# Patient Record
Sex: Male | Born: 1950
Health system: Southern US, Community
[De-identification: ages and names within clinical notes are randomized; demographics above are authoritative.]

## PROBLEM LIST (undated history)

## (undated) DIAGNOSIS — E785 Hyperlipidemia, unspecified: Secondary | ICD-10-CM

## (undated) DIAGNOSIS — E7800 Pure hypercholesterolemia, unspecified: Secondary | ICD-10-CM

## (undated) DIAGNOSIS — I429 Cardiomyopathy, unspecified: Secondary | ICD-10-CM

## (undated) DIAGNOSIS — M5430 Sciatica, unspecified side: Secondary | ICD-10-CM

## (undated) DIAGNOSIS — I2510 Atherosclerotic heart disease of native coronary artery without angina pectoris: Secondary | ICD-10-CM

## (undated) DIAGNOSIS — E119 Type 2 diabetes mellitus without complications: Secondary | ICD-10-CM

## (undated) DIAGNOSIS — I509 Heart failure, unspecified: Secondary | ICD-10-CM

## (undated) DIAGNOSIS — I10 Essential (primary) hypertension: Secondary | ICD-10-CM

## (undated) DIAGNOSIS — L409 Psoriasis, unspecified: Secondary | ICD-10-CM

## (undated) DIAGNOSIS — R079 Chest pain, unspecified: Secondary | ICD-10-CM

## (undated) HISTORY — DX: Pure hypercholesterolemia, unspecified: E78.00

## (undated) HISTORY — DX: Hyperlipidemia, unspecified: E78.5

## (undated) HISTORY — DX: Type 2 diabetes mellitus without complications: E11.9

## (undated) HISTORY — DX: Chest pain, unspecified: R07.9

## (undated) HISTORY — DX: Sciatica, unspecified side: M54.30

## (undated) HISTORY — DX: Heart failure, unspecified: I50.9

## (undated) HISTORY — DX: Atherosclerotic heart disease of native coronary artery without angina pectoris: I25.10

## (undated) HISTORY — DX: Essential (primary) hypertension: I10

## (undated) HISTORY — DX: Psoriasis, unspecified: L40.9

## (undated) HISTORY — PX: CORONARY ANGIOPLASTY WITH STENT PLACEMENT: SHX49

## (undated) HISTORY — DX: Cardiomyopathy, unspecified: I42.9

---

## 2005-05-31 ENCOUNTER — Encounter: Admission: RE | Admit: 2005-05-31 | Discharge: 2005-06-07 | Payer: Self-pay | Admitting: Family Medicine

## 2008-08-29 ENCOUNTER — Encounter: Admission: RE | Admit: 2008-08-29 | Discharge: 2008-10-06 | Payer: Self-pay | Admitting: Family Medicine

## 2009-10-26 ENCOUNTER — Observation Stay (HOSPITAL_COMMUNITY): Admission: RE | Admit: 2009-10-26 | Discharge: 2009-10-27 | Payer: Self-pay | Admitting: Interventional Cardiology

## 2010-11-12 LAB — BASIC METABOLIC PANEL
Calcium: 8.9 mg/dL (ref 8.4–10.5)
Creatinine, Ser: 1.59 mg/dL — ABNORMAL HIGH (ref 0.4–1.5)
GFR calc Af Amer: 54 mL/min — ABNORMAL LOW (ref >60)

## 2010-11-12 LAB — GLUCOSE, CAPILLARY
Glucose-Capillary: 157 mg/dL — ABNORMAL HIGH (ref 70–99)
Glucose-Capillary: 185 mg/dL — ABNORMAL HIGH (ref 70–99)

## 2010-11-12 LAB — CBC
RBC: 4.48 MIL/uL (ref 4.22–5.81)
WBC: 6.8 10*3/uL (ref 4.0–10.5)

## 2013-05-29 ENCOUNTER — Encounter: Payer: Self-pay | Admitting: Interventional Cardiology

## 2013-05-29 ENCOUNTER — Encounter: Payer: Self-pay | Admitting: *Deleted

## 2013-05-29 DIAGNOSIS — I10 Essential (primary) hypertension: Secondary | ICD-10-CM | POA: Insufficient documentation

## 2013-05-29 DIAGNOSIS — L409 Psoriasis, unspecified: Secondary | ICD-10-CM | POA: Insufficient documentation

## 2013-05-29 DIAGNOSIS — N183 Chronic kidney disease, stage 3 unspecified: Secondary | ICD-10-CM | POA: Insufficient documentation

## 2013-05-29 DIAGNOSIS — I25119 Atherosclerotic heart disease of native coronary artery with unspecified angina pectoris: Secondary | ICD-10-CM | POA: Insufficient documentation

## 2013-05-29 DIAGNOSIS — I5042 Chronic combined systolic (congestive) and diastolic (congestive) heart failure: Secondary | ICD-10-CM | POA: Insufficient documentation

## 2013-05-29 DIAGNOSIS — M5430 Sciatica, unspecified side: Secondary | ICD-10-CM | POA: Insufficient documentation

## 2013-05-29 DIAGNOSIS — I5043 Acute on chronic combined systolic (congestive) and diastolic (congestive) heart failure: Secondary | ICD-10-CM | POA: Insufficient documentation

## 2013-05-29 DIAGNOSIS — E785 Hyperlipidemia, unspecified: Secondary | ICD-10-CM | POA: Insufficient documentation

## 2013-05-29 DIAGNOSIS — E119 Type 2 diabetes mellitus without complications: Secondary | ICD-10-CM | POA: Insufficient documentation

## 2013-05-29 DIAGNOSIS — I2510 Atherosclerotic heart disease of native coronary artery without angina pectoris: Secondary | ICD-10-CM | POA: Insufficient documentation

## 2013-05-29 DIAGNOSIS — E7800 Pure hypercholesterolemia, unspecified: Secondary | ICD-10-CM | POA: Insufficient documentation

## 2013-05-29 DIAGNOSIS — I429 Cardiomyopathy, unspecified: Secondary | ICD-10-CM | POA: Insufficient documentation

## 2013-05-29 DIAGNOSIS — R079 Chest pain, unspecified: Secondary | ICD-10-CM | POA: Insufficient documentation

## 2013-06-02 ENCOUNTER — Encounter (INDEPENDENT_AMBULATORY_CARE_PROVIDER_SITE_OTHER): Payer: Self-pay

## 2013-06-02 ENCOUNTER — Encounter: Payer: Self-pay | Admitting: Interventional Cardiology

## 2013-06-02 ENCOUNTER — Ambulatory Visit (INDEPENDENT_AMBULATORY_CARE_PROVIDER_SITE_OTHER): Payer: 59 | Admitting: Interventional Cardiology

## 2013-06-02 VITALS — BP 124/76 | HR 88 | Ht 67.0 in | Wt 185.0 lb

## 2013-06-02 DIAGNOSIS — I428 Other cardiomyopathies: Secondary | ICD-10-CM

## 2013-06-02 DIAGNOSIS — R079 Chest pain, unspecified: Secondary | ICD-10-CM

## 2013-06-02 DIAGNOSIS — I10 Essential (primary) hypertension: Secondary | ICD-10-CM

## 2013-06-02 DIAGNOSIS — E785 Hyperlipidemia, unspecified: Secondary | ICD-10-CM

## 2013-06-02 DIAGNOSIS — I429 Cardiomyopathy, unspecified: Secondary | ICD-10-CM

## 2013-06-02 DIAGNOSIS — I5022 Chronic systolic (congestive) heart failure: Secondary | ICD-10-CM

## 2013-06-02 DIAGNOSIS — I2510 Atherosclerotic heart disease of native coronary artery without angina pectoris: Secondary | ICD-10-CM

## 2013-06-02 NOTE — Progress Notes (Deleted)
Patient ID: Marc [name], male   DOB: [birth year], 62 y.o.   MRN: [id number]    HPI  Allergies not on file  Current Outpatient Prescriptions  Medication Sig Dispense Refill  . aspirin 325 MG tablet Take 325 mg by mouth daily.      . carvedilol (COREG) 25 MG tablet Take 25 mg by mouth 2 (two) times daily with a meal.      . clopidogrel (PLAVIX) 75 MG tablet Take 75 mg by mouth daily.      Marland Kitchen ezetimibe-simvastatin (VYTORIN) 10-20 MG per tablet Take 1 tablet by mouth at bedtime.      . hydrochlorothiazide (MICROZIDE) 12.5 MG capsule Take 12.5 mg by mouth daily.      . meloxicam (MOBIC) 15 MG tablet Take 15 mg by mouth daily.      . metFORMIN (GLUCOPHAGE) 500 MG tablet Take 500 mg by mouth 2 (two) times daily with a meal.      . Multiple Vitamin (MULTIVITAMIN) capsule Take 1 capsule by mouth daily.      Marland Kitchen selenium sulfide (SELSUN) 2.5 % shampoo Apply topically daily as needed for itching.      . terbinafine (LAMISIL) 250 MG tablet Take 250 mg by mouth daily.       No current facility-administered medications for this visit.    Past Medical History  Diagnosis Date  . Heart failure   . Cardiomyopathy     Left ventricle dysfunction with LVEF 35-40% by echo March 2009  . Hypercholesteremia   . Hyperlipidemia   . Coronary atherosclerosis of native coronary artery     with large anterior myocardial infarction 1997. PTCA LAD. Distal circumflex and first diagonal DES 2011  . HTN (hypertension)   . Diabetes   . Sciatica   . Hypercholesteremia   . Chest pain   . Hyperlipidemia   . Psoriasis     Past Surgical History  Procedure Laterality Date  . Coronary angioplasty with stent placement      x 2 Dr. Tamala Julian    ROS: PHYSICAL EXAM There were no vitals taken for this visit.  EKG:  ASSESSMENT AND PLAN

## 2013-06-02 NOTE — Patient Instructions (Signed)
Your physician recommends that you continue on your current medications as directed. Please refer to the Current Medication list given to you today.  Your physician recommends that you schedule a follow-up appointment in: 6 months with Dr.Smith  Please stay active and follow a low carb diet. You are doing great.

## 2013-06-02 NOTE — Progress Notes (Signed)
Patient ID: Marc [name], male   DOB: 06/30/1951, 62 y.o.   MRN: [id number]    HPI Marc [name] is under stress at work and at home. He denies angina. He has not been exercising recently because of work constraints. There've been no episodes of nitroglycerin use. He denies orthopnea. He did have been asked to follow coming down stairs, but did not break any bones. He has gained some weight. He denies medication side effects.  No Known Allergies  Current Outpatient Prescriptions  Medication Sig Dispense Refill  . aspirin 325 MG tablet Take 325 mg by mouth daily.      Marland Kitchen atorvastatin (LIPITOR) 80 MG tablet Take 80 mg by mouth daily.      . calcium carbonate (TUMS - DOSED IN MG ELEMENTAL CALCIUM) 500 MG chewable tablet Chew 1 tablet by mouth as needed for heartburn.      . carvedilol (COREG) 25 MG tablet Take 25 mg by mouth 2 (two) times daily with a meal.      . Cinnamon 500 MG capsule Take 500 mg by mouth daily.      . clopidogrel (PLAVIX) 75 MG tablet Take 75 mg by mouth daily.      Marland Kitchen ezetimibe (ZETIA) 10 MG tablet Take 10 mg by mouth daily.      . fish oil-omega-3 fatty acids 1000 MG capsule Take 3 g by mouth daily.      . fluocinonide gel (LIDEX) 0.05 % Apply topically 3 (three) times a week.      . hydrochlorothiazide (MICROZIDE) 12.5 MG capsule Take 12.5 mg by mouth daily.      . irbesartan (AVAPRO) 300 MG tablet Take 300 mg by mouth at bedtime.      . Lactase (LACTAID PO) Take by mouth as needed.      . metFORMIN (GLUCOPHAGE) 500 MG tablet Take 500 mg by mouth 2 (two) times daily with a meal.      . Multiple Vitamin (MULTIVITAMIN) capsule Take 1 capsule by mouth daily.      . nitroGLYCERIN (NITROSTAT) 0.4 MG SL tablet Place 0.4 mg under the tongue every 5 (five) minutes as needed for chest pain.      Marland Kitchen selenium sulfide (SELSUN) 2.5 % shampoo Apply topically daily as needed for itching.       No current facility-administered medications for this visit.    Past Medical History    Diagnosis Date  . Heart failure   . Cardiomyopathy     Left ventricle dysfunction with LVEF 35-40% by echo March 2009  . Hypercholesteremia   . Hyperlipidemia   . Coronary atherosclerosis of native coronary artery     with large anterior myocardial infarction 1997. PTCA LAD. Distal circumflex and first diagonal DES 2011  . HTN (hypertension)   . Diabetes   . Sciatica   . Hypercholesteremia   . Chest pain   . Hyperlipidemia   . Psoriasis     Past Surgical History  Procedure Laterality Date  . Coronary angioplasty with stent placement      x 2 Dr. Tamala Julian    Family History  Problem Relation Age of Onset  . Arrhythmia Mother   . Prostate cancer Father   . Diabetes Mother   . Heart failure Mother   . Hypertension Mother   . Stroke Father   . Thyroid disease Brother     History   Social History  . Marital Status: Married    Spouse Name: N/A  Number of Children: N/A  . Years of Education: N/A   Occupational History  . Not on file.   Social History Main Topics  . Smoking status: Never Smoker   . Smokeless tobacco: Not on file  . Alcohol Use: Yes     Comment: occasional  . Drug Use: No  . Sexual Activity: Not on file   Other Topics Concern  . Not on file   Social History Narrative  . No narrative on file    ROS: Bilateral knee arthritis prevents certain activities. He feels that this was the root of his fall on the staircase. No neurological complaints. He denies palpitations. No medication side effects.  PHYSICAL EXAM BP 124/76  Pulse 88  Ht 5\' 7"  (1.702 m)  Wt 185 lb (83.915 kg)  BMI 28.97 kg/m2 Skin is clear HEENT exam unremarkable No JVD is noted. No carotid bruits. S4 gallop. No murmur. No peripheral edema. Femoral and posterior tibial pulses 2+. No neurological abnormalities are noted.  EKG: Anteroseptal infarction, old. Prominent voltage.  ASSESSMENT AND PLAN  1. Coronary atherosclerosis without angina.  2. Chronic systolic heart  failure, stable without limiting symptoms.  3. Hyperlipidemia, on therapy with good lipid panel within the last 2 months.  4. Hypertension , controlled.  In summary, Marc [name] is stable. He denies symptoms of angina, and heart failure. No medication side effects. He has gained low, weight and lost some conditioning. I encouraged physical activity. I encourage caloric intake reduction.

## 2013-06-15 ENCOUNTER — Other Ambulatory Visit: Payer: Self-pay | Admitting: Interventional Cardiology

## 2013-08-26 ENCOUNTER — Other Ambulatory Visit: Payer: Self-pay | Admitting: *Deleted

## 2013-08-26 MED ORDER — ATORVASTATIN CALCIUM 80 MG PO TABS: 80.0000 mg | ORAL_TABLET | Freq: Every day | ORAL | Status: DC

## 2013-09-24 ENCOUNTER — Telehealth: Payer: Self-pay

## 2013-09-24 MED ORDER — EZETIMIBE 10 MG PO TABS: 10.0000 mg | ORAL_TABLET | Freq: Every day | ORAL | Status: DC

## 2013-09-24 NOTE — Telephone Encounter (Signed)
Refill

## 2014-01-28 ENCOUNTER — Other Ambulatory Visit: Payer: Self-pay | Admitting: *Deleted

## 2014-01-28 MED ORDER — ATORVASTATIN CALCIUM 80 MG PO TABS: 80.0000 mg | ORAL_TABLET | Freq: Every day | ORAL | Status: DC

## 2014-02-15 ENCOUNTER — Other Ambulatory Visit: Payer: Self-pay | Admitting: *Deleted

## 2014-02-15 MED ORDER — NITROGLYCERIN 0.4 MG SL SUBL: 0.4000 mg | SUBLINGUAL_TABLET | SUBLINGUAL | Status: DC | PRN

## 2014-02-25 ENCOUNTER — Encounter: Payer: Self-pay | Admitting: *Deleted

## 2014-02-25 ENCOUNTER — Encounter: Payer: 59 | Attending: Family Medicine | Admitting: *Deleted

## 2014-02-25 VITALS — Ht 67.0 in | Wt 185.2 lb

## 2014-02-25 DIAGNOSIS — Z713 Dietary counseling and surveillance: Secondary | ICD-10-CM | POA: Diagnosis not present

## 2014-02-25 DIAGNOSIS — E119 Type 2 diabetes mellitus without complications: Secondary | ICD-10-CM | POA: Insufficient documentation

## 2014-02-25 NOTE — Progress Notes (Signed)
Appt start time: 0800 end time:  0930.  Assessment:  Patient was seen on  02/25/14 for individual diabetes education. Here with his wife Luellen Pucker, who appears supportive. They have modified portion sizes about 2 month ago in effort to lose weight. SMBG 3 days a week with reported current range of 110-148 mg/dl pre and post meals. His wife buys and prepares their meals. 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 He enjoys raquetball, music, watching basketball.   Current HbA1c: 11.5%  Preferred Learning Style: all of the following  Auditory  Visual  Hands on  Learning Readiness:   Ready  Change in progress  MEDICATIONS: see list, diabetes medications are Metformin and Amaryl  DIETARY INTAKE: 24-hr recall:  B ( AM): Breakfast bar with banana and 2-3 oz juice  Snk ( AM): no  L ( PM): buys at work, [place] sandwich OR grilled chicken or salmon salad with non creamy dressing, unsweet tea with lemon Snk ( PM): breakfast bar D ( PM): lean meat, occasionally a starch, vegetables, occasionally salad with vinaigrette dressing Snk ( PM): almonds Beverages: juice, unsweet tea  Usual physical activity: racquetball, walking, mowing grass with push mower  Estimated energy needs: 1600 calories 180 g carbohydrates 120 g protein 44 g fat  Progress Towards Goal(s):  In progress.   Nutritional Diagnosis:  NB-1.1 Food and nutrition-related knowledge deficit As related to diabetes.  As evidenced by A1c of 11.5%.    Intervention:  Nutrition counseling provided.  Discussed diabetes disease process and treatment options.  Discussed physiology of diabetes and role of obesity on insulin resistance.  Encouraged moderate weight reduction to improve glucose levels.    Provided education on macronutrients on glucose levels.  Provided education on carb counting, importance of regularly scheduled meals/snacks, and meal planning  Discussed effects of physical activity on glucose  levels and long-term glucose control.  Commended him on his current physical activity/week.  Reviewed patient medications.  Discussed role of medication on blood glucose and possible side effects  Discussed blood glucose monitoring and interpretation.  Discussed recommended target ranges and individual ranges.    Described short-term complications: hyper- and hypo-glycemia.  Discussed causes,symptoms, and treatment options.  Discussed prevention, detection, and treatment of long-term complications.  Discussed the role of prolonged elevated glucose levels on body systems.  Discussed role of stress on blood glucose levels and discussed strategies to manage psychosocial issues.  Discussed recommendations for long-term diabetes self-care.  Provided checklist for medical, dental, and emotional self-care.  Plan:  Aim for 3 Carb Choices per meal (45 grams) +/- 1 either way  Aim for 0-2 Carbs per snack if hungry  Include protein in moderation with your meals and snacks Consider reading food labels for Total Carbohydrate of foods Continue withyour activity level as tolerated Continue checking BG at alternate times per day as directed by MD  Continue taking medication as directed by MD  Teaching Method Utilized: Visual, Auditory and Hands on  Handouts given during visit include: Living Well with Diabetes Carb Counting and Food Label handouts Meal Plan Card  Barriers to learning/adherence to lifestyle change: none  Diabetes self-care support plan:   [facility] support group  Demonstrated degree of understanding via:  Teach Back   Monitoring/Evaluation:  Dietary intake, exercise, SMBG, reading food labels, and body weight prn.

## 2014-02-25 NOTE — Patient Instructions (Signed)
Plan:  Aim for 3 Carb Choices per meal (45 grams) +/- 1 either way  Aim for 0-2 Carbs per snack if hungry  Include protein in moderation with your meals and snacks Consider reading food labels for Total Carbohydrate of foods Continue withyour activity level as tolerated Continue checking BG at alternate times per day as directed by MD  Continue taking medication as directed by MD

## 2014-03-01 ENCOUNTER — Other Ambulatory Visit: Payer: Self-pay | Admitting: *Deleted

## 2014-03-01 MED ORDER — ATORVASTATIN CALCIUM 80 MG PO TABS
80.0000 mg | ORAL_TABLET | Freq: Every day | ORAL | Status: DC
Start: 2014-03-01 — End: 2014-04-27

## 2014-03-17 ENCOUNTER — Ambulatory Visit: Payer: 59 | Admitting: Dietician

## 2014-03-25 ENCOUNTER — Ambulatory Visit (INDEPENDENT_AMBULATORY_CARE_PROVIDER_SITE_OTHER): Payer: 59 | Admitting: *Deleted

## 2014-03-25 DIAGNOSIS — I429 Cardiomyopathy, unspecified: Secondary | ICD-10-CM

## 2014-03-25 DIAGNOSIS — E7800 Pure hypercholesterolemia, unspecified: Secondary | ICD-10-CM

## 2014-03-25 DIAGNOSIS — I428 Other cardiomyopathies: Secondary | ICD-10-CM

## 2014-03-25 LAB — CHOLESTEROL, TOTAL: Cholesterol: 67 mg/dL (ref 0–200)

## 2014-03-25 LAB — ALT: ALT: 22 U/L (ref 0–53)

## 2014-04-08 ENCOUNTER — Telehealth: Payer: Self-pay

## 2014-04-08 DIAGNOSIS — E785 Hyperlipidemia, unspecified: Secondary | ICD-10-CM

## 2014-04-08 NOTE — Telephone Encounter (Signed)
pt fasting lab appt sch for 07/04/14 pt aware and verbalized understanding.

## 2014-04-08 NOTE — Telephone Encounter (Signed)
Message copied by Lamar Laundry on Fri Apr 08, 2014  1:27 PM ------      Message from: Daneen Schick      Created: Mon Mar 28, 2014  8:23 AM       Did he have a full lipid panel performed? Total cholesterol is 67. This is hard for me to believe. No change in therapy but will need early recheck in approximately 3 months. He needs a full lipid panel ------

## 2014-04-08 NOTE — Telephone Encounter (Signed)
lmom.pt did not have full lipid panel performed? Total cholesterol is 67.  No change in therapy but will need early recheck in approximately 3 months. He needs a full lipid panel

## 2014-04-27 ENCOUNTER — Other Ambulatory Visit: Payer: Self-pay

## 2014-04-27 MED ORDER — ATORVASTATIN CALCIUM 80 MG PO TABS: 80.0000 mg | ORAL_TABLET | Freq: Every day | ORAL | Status: DC

## 2014-06-10 ENCOUNTER — Other Ambulatory Visit: Payer: Self-pay | Admitting: *Deleted

## 2014-06-10 MED ORDER — CLOPIDOGREL BISULFATE 75 MG PO TABS: ORAL_TABLET | ORAL | Status: DC

## 2014-06-22 ENCOUNTER — Other Ambulatory Visit: Payer: Self-pay

## 2014-06-22 MED ORDER — ATORVASTATIN CALCIUM 80 MG PO TABS: 80.0000 mg | ORAL_TABLET | Freq: Every day | ORAL | Status: DC

## 2014-06-27 ENCOUNTER — Other Ambulatory Visit: Payer: Self-pay | Admitting: *Deleted

## 2014-06-27 MED ORDER — EZETIMIBE 10 MG PO TABS: 10.0000 mg | ORAL_TABLET | Freq: Every day | ORAL | Status: DC

## 2014-07-04 ENCOUNTER — Other Ambulatory Visit (INDEPENDENT_AMBULATORY_CARE_PROVIDER_SITE_OTHER): Payer: 59 | Admitting: *Deleted

## 2014-07-04 DIAGNOSIS — E785 Hyperlipidemia, unspecified: Secondary | ICD-10-CM

## 2014-07-04 LAB — LIPID PANEL
CHOL/HDL RATIO: 4
Cholesterol: 79 mg/dL (ref 0–200)
HDL: 19.2 mg/dL — ABNORMAL LOW (ref >39.00)
LDL Cholesterol: 43 mg/dL (ref 0–99)
NONHDL: 59.8
Triglycerides: 84 mg/dL (ref 0.0–149.0)
VLDL: 16.8 mg/dL (ref 0.0–40.0)

## 2014-07-07 ENCOUNTER — Telehealth: Payer: Self-pay

## 2014-07-07 MED ORDER — ATORVASTATIN CALCIUM 40 MG PO TABS: 40.0000 mg | ORAL_TABLET | Freq: Every day | ORAL | Status: DC

## 2014-07-07 NOTE — Telephone Encounter (Signed)
F/u  Patient returning call. Please contact at [phone].

## 2014-07-07 NOTE — Telephone Encounter (Signed)
Pt aware of lab results and  Dr.Smith's recommendations.Decrease atorvastatin to 40 mg daily.rx sent to pt pharmacy Pt verbalized understanding.

## 2014-07-07 NOTE — Telephone Encounter (Signed)
-----   Message from [name], MD sent at 07/06/2014  4:50 PM EST ----- Decrease atorvastatin to 40 mg daily

## 2014-07-07 NOTE — Telephone Encounter (Signed)
Called to give pt labs and Dr.Smith instructions.lmtcb

## 2014-07-13 ENCOUNTER — Other Ambulatory Visit: Payer: Self-pay | Admitting: *Deleted

## 2014-07-13 MED ORDER — CLOPIDOGREL BISULFATE 75 MG PO TABS: ORAL_TABLET | ORAL | Status: DC

## 2014-08-11 ENCOUNTER — Other Ambulatory Visit: Payer: Self-pay | Admitting: Interventional Cardiology

## 2014-08-15 ENCOUNTER — Other Ambulatory Visit: Payer: Self-pay | Admitting: *Deleted

## 2014-08-15 MED ORDER — EZETIMIBE 10 MG PO TABS: 10.0000 mg | ORAL_TABLET | Freq: Every day | ORAL | Status: DC

## 2014-09-13 ENCOUNTER — Other Ambulatory Visit: Payer: Self-pay | Admitting: Interventional Cardiology

## 2014-10-13 ENCOUNTER — Other Ambulatory Visit: Payer: Self-pay | Admitting: Interventional Cardiology

## 2014-10-14 NOTE — Telephone Encounter (Signed)
Patient has still failed to schedule an appointment. Please advise on refill. Thanks, MI

## 2014-11-03 ENCOUNTER — Other Ambulatory Visit: Payer: Self-pay | Admitting: Interventional Cardiology

## 2014-11-03 NOTE — Telephone Encounter (Signed)
Patient has had multiple warnings to schedule an appointment, but still has failed to do so. Please advise on refill. Thanks, MI

## 2014-11-08 ENCOUNTER — Telehealth: Payer: Self-pay | Admitting: Interventional Cardiology

## 2014-11-08 MED ORDER — CLOPIDOGREL BISULFATE 75 MG PO TABS: ORAL_TABLET | ORAL | Status: DC

## 2014-11-08 MED ORDER — EZETIMIBE 10 MG PO TABS: ORAL_TABLET | ORAL | Status: DC

## 2014-11-08 NOTE — Telephone Encounter (Signed)
New message      Pt want to set up an appt with Dr Tamala Julian but did not want to talk to appointments

## 2014-11-08 NOTE — Telephone Encounter (Signed)
Returned pt call. Pt rqst to have his Plavix and Zetia refilled with enough tabs until his appt. Rx sent to pt pharmacy #[id number] R-0 Pt sts that he is doing well. appt with Dr.Smith moved up to 5/11. Adv pt to keep upcoming appt to receive additional refills. Pt verbalized understanding.

## 2014-11-16 ENCOUNTER — Other Ambulatory Visit: Payer: Self-pay | Admitting: Interventional Cardiology

## 2014-12-28 ENCOUNTER — Encounter: Payer: Self-pay | Admitting: Interventional Cardiology

## 2014-12-28 ENCOUNTER — Ambulatory Visit (INDEPENDENT_AMBULATORY_CARE_PROVIDER_SITE_OTHER): Payer: 59 | Admitting: Interventional Cardiology

## 2014-12-28 VITALS — BP 144/92 | HR 80 | Ht 67.0 in | Wt 190.1 lb

## 2014-12-28 DIAGNOSIS — I10 Essential (primary) hypertension: Secondary | ICD-10-CM | POA: Diagnosis not present

## 2014-12-28 DIAGNOSIS — I5042 Chronic combined systolic (congestive) and diastolic (congestive) heart failure: Secondary | ICD-10-CM | POA: Diagnosis not present

## 2014-12-28 DIAGNOSIS — I429 Cardiomyopathy, unspecified: Secondary | ICD-10-CM

## 2014-12-28 DIAGNOSIS — E785 Hyperlipidemia, unspecified: Secondary | ICD-10-CM | POA: Diagnosis not present

## 2014-12-28 DIAGNOSIS — I2510 Atherosclerotic heart disease of native coronary artery without angina pectoris: Secondary | ICD-10-CM | POA: Diagnosis not present

## 2014-12-28 NOTE — Patient Instructions (Signed)
Medication Instructions:  Your physician recommends that you continue on your current medications as directed. Please refer to the Current Medication list given to you today.   Labwork: None   Testing/Procedures: Your physician has requested that you have en exercise stress myoview. For further information please visit [email or web address]. Please follow instruction sheet, as given. (To be scheduled in Nov 2016)   Follow-Up: Your physician wants you to follow-up in: 1 year with Dr.Smith You will receive a reminder letter in the mail two months in advance. If you don't receive a letter, please call our office to schedule the follow-up appointment.   Any Other Special Instructions Will Be Listed Below (If Applicable).

## 2014-12-28 NOTE — Progress Notes (Signed)
Cardiology Office Note   Date:  12/28/2014   ID:  SHAARAV OSMOND, DOB 1951/01/23, MRN [id number]  PCP:   Melinda Crutch, MD  Cardiologist:  Sinclair Grooms, MD   Chief Complaint  Patient presents with  . Coronary Artery Disease      History of Present Illness: Marc [name] is a 64 y.o. male who presents for  Ischemic cardiomyopathy, chronic systolic heart failure, CAD with LAD infarct approximately 19 years ago. History of type 2 diabetes and hyperlipidemia.   [name] is doing well. He denies angina. He continues to play racquetball. He is able to mow his grass without significant difficulty. He has no lower extremity swelling and denies orthopnea. He has not had palpitations or syncope.    Past Medical History  Diagnosis Date  . Heart failure   . Cardiomyopathy     Left ventricle dysfunction with LVEF 35-40% by echo March 2009  . Hypercholesteremia   . Hyperlipidemia   . Coronary atherosclerosis of native coronary artery     with large anterior myocardial infarction 1997. PTCA LAD. Distal circumflex and first diagonal DES 2011  . HTN (hypertension)   . Diabetes   . Sciatica   . Hypercholesteremia   . Chest pain   . Hyperlipidemia   . Psoriasis     Past Surgical History  Procedure Laterality Date  . Coronary angioplasty with stent placement      x 2 Dr. Tamala Julian     Current Outpatient Prescriptions  Medication Sig Dispense Refill  . aspirin 325 MG tablet Take 325 mg by mouth daily.    Marland Kitchen atorvastatin (LIPITOR) 40 MG tablet Take 1 tablet (40 mg total) by mouth daily. 90 tablet 1  . calcium carbonate (TUMS - DOSED IN MG ELEMENTAL CALCIUM) 500 MG chewable tablet Chew 1 tablet by mouth as needed for heartburn.    . carvedilol (COREG) 25 MG tablet Take 25 mg by mouth 2 (two) times daily with a meal.    . Cinnamon 500 MG capsule Take 500 mg by mouth daily.    . clopidogrel (PLAVIX) 75 MG tablet TAKE 1 TABLET BY MOUTH EVERY DAY 90 tablet 0  . ezetimibe (ZETIA)  10 MG tablet TAKE 1 TABLET BY MOUTH EVERY DAY 90 tablet 0  . fish oil-omega-3 fatty acids 1000 MG capsule Take 3 g by mouth daily.    . fluocinonide gel (LIDEX) 0.05 % Apply topically 3 (three) times a week.    Marland Kitchen glimepiride (AMARYL) 1 MG tablet Take 1 mg by mouth daily with breakfast.    . hydrochlorothiazide (MICROZIDE) 12.5 MG capsule Take 12.5 mg by mouth daily.    . irbesartan (AVAPRO) 300 MG tablet Take 300 mg by mouth at bedtime.    . Lactase (LACTAID PO) Take by mouth as needed.    . metFORMIN (GLUCOPHAGE) 500 MG tablet Take 500 mg by mouth 2 (two) times daily with a meal.    . Multiple Vitamin (MULTIVITAMIN) capsule Take 1 capsule by mouth daily.    . nitroGLYCERIN (NITROSTAT) 0.4 MG SL tablet Place 1 tablet (0.4 mg total) under the tongue every 5 (five) minutes as needed for chest pain. 25 tablet 3  . selenium sulfide (SELSUN) 2.5 % shampoo Apply topically daily as needed for itching.     No current facility-administered medications for this visit.    Allergies:   Review of patient's allergies indicates no known allergies.    Social History:  The patient  reports  that he has never smoked. He does not have any smokeless tobacco history on file. He reports that he drinks alcohol. He reports that he does not use illicit drugs.   Family History:  The patient's family history includes Arrhythmia in his mother; Diabetes in his mother; Healthy in his brother and sister; Heart failure in his mother; Hypertension in his mother; Prostate cancer in his brother and father; Stroke in his father; Thyroid disease in his brother.    ROS:  Please see the history of present illness.   Otherwise, review of systems are positive for  Stable weight. Improved hemoglobin A1c on medication and diet..   All other systems are reviewed and negative.    PHYSICAL EXAM: VS:  BP 144/92 mmHg  Pulse 80  Ht 5\' 7"  (1.702 m)  Wt 190 lb 1.9 oz (86.238 kg)  BMI 29.77 kg/m2 , BMI Body mass index is 29.77  kg/(m^2). GEN: Well nourished, well developed, in no acute distress HEENT: normal Neck: no JVD, carotid bruits, or masses Cardiac: RRR; or murmurs. An S4 gallop is present. There are no rubs, or gallops,no edema.  Respiratory:  clear to auscultation bilaterally, normal work of breathing GI: soft, nontender, nondistended, + BS MS: no deformity or atrophy Skin: warm and dry, no rash Neuro:  Strength and sensation are intact Psych: euthymic mood, full affect   EKG:  EKG is ordered today. The ekg ordered today demonstrates  Normal sinus rhythm , rate 80 bpm ,  Mild first-degree AV block , poor R wave progression V1 through V4   Recent Labs: 03/25/2014: ALT 22    Lipid Panel    Component Value Date/Time   CHOL 79 07/04/2014 0849   TRIG 84.0 07/04/2014 0849   HDL 19.20* 07/04/2014 0849   CHOLHDL 4 07/04/2014 0849   VLDL 16.8 07/04/2014 0849   LDLCALC 43 07/04/2014 0849      Wt Readings from Last 3 Encounters:  12/28/14 190 lb 1.9 oz (86.238 kg)  02/25/14 185 lb 3.2 oz (84.006 kg)  06/02/13 185 lb (83.915 kg)      Other studies Reviewed: Additional studies/ records that were reviewed today include: . Review of the above records demonstrates:  None reviewed   ASSESSMENT AND PLAN:  Atherosclerosis of native coronary artery of native heart without angina pectoris: stable  Chronic combined systolic and diastolic CHF, NYHA class 2: no evidence of volume overload  Hyperlipidemia: followed by primary care in stable  Essential hypertension:  Borderline elevated     Current medicines are reviewed at length with the patient today.  The patient does not have concerns regarding medicines.  The following changes have been made:  no change  Labs/ tests ordered today include:   In 6 months we will perform a myocardial perfusion study with treadmill stress. This gives an assessment of LV function and overall functional ability.  Orders Placed This Encounter  Procedures  .  Myocardial Perfusion Imaging  . EKG 12-Lead     Disposition:   FU with HS in 1 year  Signed, Sinclair Grooms, MD  12/28/2014 4:58 PM    [facility] [address], [place], [place]  [address] Phone: [phone]; Fax: [phone]

## 2014-12-31 ENCOUNTER — Other Ambulatory Visit: Payer: Self-pay | Admitting: Interventional Cardiology

## 2015-01-30 ENCOUNTER — Ambulatory Visit: Payer: Self-pay | Admitting: Interventional Cardiology

## 2015-02-04 ENCOUNTER — Other Ambulatory Visit: Payer: Self-pay | Admitting: Interventional Cardiology

## 2015-03-15 ENCOUNTER — Other Ambulatory Visit: Payer: Self-pay | Admitting: Interventional Cardiology

## 2015-06-21 ENCOUNTER — Telehealth (HOSPITAL_COMMUNITY): Payer: Self-pay

## 2015-06-21 ENCOUNTER — Other Ambulatory Visit: Payer: Self-pay

## 2015-06-21 DIAGNOSIS — E785 Hyperlipidemia, unspecified: Secondary | ICD-10-CM

## 2015-06-21 NOTE — Telephone Encounter (Signed)
Left message on voicemail in reference to upcoming appointment scheduled for 06-26-2015. Phone number given for [name] call back so details instructions can be given. Marc [name], Marc [name]

## 2015-06-21 NOTE — Telephone Encounter (Signed)
Patient given detailed instructions per Myocardial Perfusion Study Information Sheet for the test on 06-26-2015 at 0815. Patient notified to arrive 15 minutes early and that it is imperative to arrive on time for appointment to keep from having the test rescheduled.  If you need to cancel or reschedule your appointment, please call the office within 24 hours of your appointment. Failure to do so may result in a cancellation of your appointment, and a $50 no show fee. Patient verbalized understanding. Per Loma Newton, CNMT/ Irven [place] A

## 2015-06-21 NOTE — Telephone Encounter (Signed)
Left message on voicemail in reference to upcoming appointment scheduled for 06-26-2015. Phone number given for a call back so details instructions can be given. Oletta Lamas, Sargent Mankey A

## 2015-06-26 ENCOUNTER — Ambulatory Visit (HOSPITAL_COMMUNITY): Payer: 59 | Attending: Interventional Cardiology

## 2015-06-26 ENCOUNTER — Other Ambulatory Visit: Payer: 59 | Admitting: *Deleted

## 2015-06-26 DIAGNOSIS — I517 Cardiomegaly: Secondary | ICD-10-CM | POA: Insufficient documentation

## 2015-06-26 DIAGNOSIS — I2510 Atherosclerotic heart disease of native coronary artery without angina pectoris: Secondary | ICD-10-CM | POA: Insufficient documentation

## 2015-06-26 DIAGNOSIS — E785 Hyperlipidemia, unspecified: Secondary | ICD-10-CM

## 2015-06-26 DIAGNOSIS — I10 Essential (primary) hypertension: Secondary | ICD-10-CM | POA: Insufficient documentation

## 2015-06-26 DIAGNOSIS — R9439 Abnormal result of other cardiovascular function study: Secondary | ICD-10-CM | POA: Diagnosis not present

## 2015-06-26 DIAGNOSIS — E119 Type 2 diabetes mellitus without complications: Secondary | ICD-10-CM | POA: Insufficient documentation

## 2015-06-26 LAB — ALT: ALT: 24 U/L (ref 9–46)

## 2015-06-26 LAB — LIPID PANEL
Cholesterol: 71 mg/dL — ABNORMAL LOW (ref 125–200)
HDL: 22 mg/dL — ABNORMAL LOW (ref >=40)
LDL CALC: 25 mg/dL (ref <130)
Total CHOL/HDL Ratio: 3.2 Ratio (ref <=5.0)
Triglycerides: 121 mg/dL (ref <150)
VLDL: 24 mg/dL (ref <30)

## 2015-06-26 LAB — MYOCARDIAL PERFUSION IMAGING
CHL CUP MPHR: 156 {beats}/min
CHL CUP RESTING HR STRESS: 68 {beats}/min
CSEPEW: 9.3 METS
CSEPPHR: 133 {beats}/min
Exercise duration (min): 7 min
Exercise duration (sec): 30 s
LVDIAVOL: 212 mL
LVSYSVOL: 156 mL
Percent HR: 85 %
RATE: 1.3
SDS: 4
SRS: 28
SSS: 32
TID: 0.51

## 2015-06-26 MED ORDER — TECHNETIUM TC 99M SESTAMIBI GENERIC - CARDIOLITE
30.7000 | Freq: Once | INTRAVENOUS | Status: AC | PRN
  Administered 2015-06-26: 30.7 via INTRAVENOUS

## 2015-06-26 MED ORDER — TECHNETIUM TC 99M SESTAMIBI GENERIC - CARDIOLITE
10.2000 | Freq: Once | INTRAVENOUS | Status: AC | PRN
  Administered 2015-06-26: 10 via INTRAVENOUS

## 2015-06-27 ENCOUNTER — Telehealth: Payer: Self-pay

## 2015-06-27 NOTE — Telephone Encounter (Signed)
-----   Message from Belva Crome, MD sent at 06/26/2015 10:31 PM EST ----- EF 26%. Quite a bit lower than prior. No evidence decreased flow, only evidence prior injury. Any symptoms? Needs OV to discuss having Cardiac MRI to confirm EF

## 2015-06-27 NOTE — Telephone Encounter (Signed)
Called to give pt myoview results and Dr.Smith's recommendation. lmtcb

## 2015-06-28 ENCOUNTER — Other Ambulatory Visit: Payer: Self-pay | Admitting: Interventional Cardiology

## 2015-06-28 NOTE — Telephone Encounter (Signed)
Pt aware of myoview results and Dr.Smith's recommendation. EF 26%. Quite a bit lower than prior. No evidence decreased flow, only evidence prior injury. Any symptoms? Needs OV to discuss having Cardiac MRI to confirm EF  appt scheduled for 11/14 @ 10;45am Pt verbalized understanding.

## 2015-06-28 NOTE — Telephone Encounter (Signed)
Follow up   ° ° ° °Patient calling back for test results  °

## 2015-07-03 ENCOUNTER — Encounter: Payer: Self-pay | Admitting: Interventional Cardiology

## 2015-07-03 ENCOUNTER — Ambulatory Visit (INDEPENDENT_AMBULATORY_CARE_PROVIDER_SITE_OTHER): Payer: 59 | Admitting: Interventional Cardiology

## 2015-07-03 VITALS — BP 112/62 | HR 88 | Ht 67.0 in | Wt 192.0 lb

## 2015-07-03 DIAGNOSIS — I2510 Atherosclerotic heart disease of native coronary artery without angina pectoris: Secondary | ICD-10-CM | POA: Diagnosis not present

## 2015-07-03 DIAGNOSIS — I429 Cardiomyopathy, unspecified: Secondary | ICD-10-CM | POA: Diagnosis not present

## 2015-07-03 DIAGNOSIS — I10 Essential (primary) hypertension: Secondary | ICD-10-CM

## 2015-07-03 DIAGNOSIS — I5042 Chronic combined systolic (congestive) and diastolic (congestive) heart failure: Secondary | ICD-10-CM | POA: Diagnosis not present

## 2015-07-03 DIAGNOSIS — E785 Hyperlipidemia, unspecified: Secondary | ICD-10-CM

## 2015-07-03 NOTE — Patient Instructions (Signed)
Medication Instructions:  Your physician recommends that you continue on your current medications as directed. Please refer to the Current Medication list given to you today.   Labwork: None ordered  Testing/Procedures: Your physician has requested that you have a cardiac MRI. Cardiac MRI uses a computer to create images of your heart as its beating, producing both still and moving pictures of your heart and major blood vessels. For further information please visit [email or web address] Please follow the instruction sheet given to you today for more information.   Follow-Up: Your physician wants you to follow-up in: 6 months with Dr.Smith You will receive a reminder letter in the mail two months in advance. If you don't receive a letter, please call our office to schedule the follow-up appointment.   Any Other Special Instructions Will Be Listed Below (If Applicable).     If you need a refill on your cardiac medications before your next appointment, please call your pharmacy.

## 2015-07-03 NOTE — Progress Notes (Signed)
Cardiology Office Note   Date:  07/03/2015   ID:  Marc [name], Marc [name] 12/31/1950, MRN [id number]  PCP:   Melinda Crutch, MD  Cardiologist:  Sinclair Grooms, MD   Chief Complaint  Patient presents with  . Coronary Artery Disease      History of Present Illness: Marc [name] is a 64 y.o. male who presents for  Remote anterior MI, chronic combined systolic and diastolic HF , hypertension, diabetes mellitus, and hyperlipidemia. Most recent EF 26%.   Asymptomatic with reference to angina and heart disease symptoms. Still actively playing racquetball.    Past Medical History  Diagnosis Date  . Heart failure ([place])   . Cardiomyopathy ([place])     Left ventricle dysfunction with LVEF 35-40% by echo March 2009  . Hypercholesteremia   . Hyperlipidemia   . Coronary atherosclerosis of native coronary artery     with large anterior myocardial infarction 1997. PTCA LAD. Distal circumflex and first diagonal DES 2011  . HTN (hypertension)   . Diabetes ([place])   . Sciatica   . Hypercholesteremia   . Chest pain   . Hyperlipidemia   . Psoriasis     Past Surgical History  Procedure Laterality Date  . Coronary angioplasty with stent placement      x 2 Dr. Tamala Julian     Current Outpatient Prescriptions  Medication Sig Dispense Refill  . aspirin 325 MG tablet Take 325 mg by mouth daily.    Marland Kitchen atorvastatin (LIPITOR) 40 MG tablet TAKE 1 TABLET BY MOUTH ONCE DAILY 90 tablet 1  . calcium carbonate (TUMS - DOSED IN MG ELEMENTAL CALCIUM) 500 MG chewable tablet Chew 1 tablet by mouth as needed for heartburn.    . carvedilol (COREG) 25 MG tablet Take 25 mg by mouth 2 (two) times daily with a meal.    . clopidogrel (PLAVIX) 75 MG tablet TAKE 1 TABLET BY MOUTH EVERY DAY 90 tablet 3  . fish oil-omega-3 fatty acids 1000 MG capsule Take 3 g by mouth daily.    . fluocinonide gel (LIDEX) 0.05 % Apply topically 3 (three) times a week.    Marland Kitchen glimepiride (AMARYL) 4 MG tablet Take 4 mg by mouth  daily.  3  . hydrochlorothiazide (MICROZIDE) 12.5 MG capsule Take 12.5 mg by mouth daily.    . irbesartan (AVAPRO) 300 MG tablet Take 300 mg by mouth at bedtime.    . Lactase (LACTAID PO) Take by mouth as needed.    . metFORMIN (GLUCOPHAGE) 500 MG tablet Take 500 mg by mouth 2 (two) times daily with a meal.    . Multiple Vitamin (MULTIVITAMIN) capsule Take 1 capsule by mouth daily.    . nitroGLYCERIN (NITROSTAT) 0.4 MG SL tablet Place 1 tablet (0.4 mg total) under the tongue every 5 (five) minutes as needed for chest pain. 25 tablet 3  . selenium sulfide (SELSUN) 2.5 % shampoo Apply topically daily as needed for itching.    Marland Kitchen ZETIA 10 MG tablet TAKE 1 TABLET BY MOUTH EVERY DAY 90 tablet 3   No current facility-administered medications for this visit.    Allergies:   Review of patient's allergies indicates no known allergies.    Social History:  The patient  reports that he has never smoked. He has never used smokeless tobacco. He reports that he drinks alcohol. He reports that he does not use illicit drugs.   Family History:  The patient's family history includes Arrhythmia in his mother; Diabetes in his  mother; Healthy in his brother and sister; Heart failure in his mother; Hypertension in his mother; Prostate cancer in his brother and father; Stroke in his father; Thyroid disease in his brother.    ROS:  Please see the history of present illness.   Otherwise, review of systems are positive for none.   All other systems are reviewed and negative.    PHYSICAL EXAM: VS:  BP 112/62 mmHg  Pulse 88  Ht 5\' 7"  (1.702 m)  Wt 87.091 kg (192 lb)  BMI 30.06 kg/m2  SpO2 98% , BMI Body mass index is 30.06 kg/(m^2). GEN: Well nourished, well developed, in no acute distress HEENT: normal Neck: no JVD, carotid bruits, or masses Cardiac: RRR.  There is no murmur, rub, or gallop. There is no edema. Respiratory:  clear to auscultation bilaterally, normal work of breathing. GI: soft, nontender,  nondistended, + BS MS: no deformity or atrophy Skin: warm and dry, no rash Neuro:  Strength and sensation are intact Psych: euthymic mood, full affect   EKG:  EKG is not ordered today.    Recent Labs: 06/26/2015: ALT 24    Lipid Panel    Component Value Date/Time   CHOL 71* 06/26/2015 0856   TRIG 121 06/26/2015 0856   HDL 22* 06/26/2015 0856   CHOLHDL 3.2 06/26/2015 0856   VLDL 24 06/26/2015 0856   LDLCALC 25 06/26/2015 0856      Wt Readings from Last 3 Encounters:  07/03/15 87.091 kg (192 lb)  06/26/15 86.183 kg (190 lb)  12/28/14 86.238 kg (190 lb 1.9 oz)      Other studies Reviewed: Additional studies/ records that were reviewed today include:  Nuclear study. The findings include  Large scar. No ischemia. EF 26%.    ASSESSMENT AND PLAN:  1. Chronic combined systolic and diastolic CHF, NYHA class 2 (HCC)  no evidence of volume overload  2. Cardiomyopathy ([place])  EF by nuclear 26%.  3. Atherosclerosis of native coronary artery of native heart without angina pectoris  denies angina  4. Essential hypertension  well controlled  5. Hyperlipidemia  excellent    Current medicines are reviewed at length with the patient today.  The patient has the following concerns regarding medicines: .  The following changes/actions have been instituted:     Cardiac MRI for structure and function   If you as remains less than 35%, may need to be referred for consideration of AICD therapy.  Current medical regimen is maxed out. Consider entrust oh.  Labs/ tests ordered today include:   Orders Placed This Encounter  Procedures  . MR Card Morphology Wo/W Cm     Disposition:   FU with HS in 6 months  Signed, Sinclair Grooms, MD  07/03/2015 6:14 PM    [facility] [address], [place], [place]  [address] Phone: [phone]; Fax: [phone]

## 2015-07-04 ENCOUNTER — Encounter: Payer: Self-pay | Admitting: Interventional Cardiology

## 2015-07-17 ENCOUNTER — Other Ambulatory Visit: Payer: Self-pay

## 2015-07-17 MED ORDER — NITROGLYCERIN 0.4 MG SL SUBL: 0.4000 mg | SUBLINGUAL_TABLET | SUBLINGUAL | Status: DC | PRN

## 2015-07-17 NOTE — Telephone Encounter (Signed)
Belva Crome, MD at 07/03/2015 11:12 AM  nitroGLYCERIN (NITROSTAT) 0.4 MG SL tabletPlace 1 tablet (0.4 mg total) under the tongue every 5 (five) minutes as needed for chest pain  Medication Instructions:  Your physician recommends that you continue on your current medications as directed. Please refer to the Current Medication list given to you today.

## 2015-07-19 ENCOUNTER — Ambulatory Visit (HOSPITAL_COMMUNITY)
Admission: RE | Admit: 2015-07-19 | Discharge: 2015-07-19 | Disposition: A | Discharge status: Home / Self Care | Payer: 59 | Source: Ambulatory Visit | Attending: Interventional Cardiology | Admitting: Interventional Cardiology

## 2015-07-19 DIAGNOSIS — E1122 Type 2 diabetes mellitus with diabetic chronic kidney disease: Secondary | ICD-10-CM | POA: Insufficient documentation

## 2015-07-19 DIAGNOSIS — R29898 Other symptoms and signs involving the musculoskeletal system: Secondary | ICD-10-CM | POA: Diagnosis not present

## 2015-07-19 DIAGNOSIS — I429 Cardiomyopathy, unspecified: Secondary | ICD-10-CM | POA: Diagnosis not present

## 2015-07-19 DIAGNOSIS — N183 Chronic kidney disease, stage 3 unspecified: Secondary | ICD-10-CM

## 2015-07-19 DIAGNOSIS — N1832 Chronic kidney disease, stage 3b: Secondary | ICD-10-CM | POA: Insufficient documentation

## 2015-07-19 LAB — CREATININE, SERUM
CREATININE: 1.38 mg/dL — AB (ref 0.61–1.24)
GFR, EST NON AFRICAN AMERICAN: 53 mL/min — AB (ref >60)

## 2015-07-19 MED ORDER — GADOBENATE DIMEGLUMINE 529 MG/ML IV SOLN: 30.0000 mL | Freq: Once | INTRAVENOUS | Status: DC | PRN

## 2015-07-21 ENCOUNTER — Ambulatory Visit (HOSPITAL_COMMUNITY)
Admission: RE | Admit: 2015-07-21 | Discharge: 2015-07-21 | Disposition: A | Discharge status: Home / Self Care | Payer: 59 | Source: Ambulatory Visit | Attending: Interventional Cardiology | Admitting: Interventional Cardiology

## 2015-07-21 DIAGNOSIS — I255 Ischemic cardiomyopathy: Secondary | ICD-10-CM | POA: Insufficient documentation

## 2015-07-21 DIAGNOSIS — R29898 Other symptoms and signs involving the musculoskeletal system: Secondary | ICD-10-CM | POA: Insufficient documentation

## 2015-07-21 DIAGNOSIS — I429 Cardiomyopathy, unspecified: Secondary | ICD-10-CM | POA: Diagnosis not present

## 2015-07-21 MED ORDER — GADOBENATE DIMEGLUMINE 529 MG/ML IV SOLN
30.0000 mL | Freq: Once | INTRAVENOUS | Status: AC | PRN
  Administered 2015-07-21: 30 mL via INTRAVENOUS

## 2015-12-21 ENCOUNTER — Other Ambulatory Visit: Payer: Self-pay | Admitting: Interventional Cardiology

## 2016-01-02 ENCOUNTER — Other Ambulatory Visit: Payer: Self-pay

## 2016-01-02 MED ORDER — CARVEDILOL 25 MG PO TABS: 25.0000 mg | ORAL_TABLET | Freq: Two times a day (BID) | ORAL | Status: DC

## 2016-01-19 ENCOUNTER — Ambulatory Visit (INDEPENDENT_AMBULATORY_CARE_PROVIDER_SITE_OTHER): Payer: 59 | Admitting: Interventional Cardiology

## 2016-01-19 ENCOUNTER — Encounter: Payer: Self-pay | Admitting: Interventional Cardiology

## 2016-01-19 VITALS — BP 138/82 | HR 84 | Ht 67.0 in | Wt 193.2 lb

## 2016-01-19 DIAGNOSIS — E785 Hyperlipidemia, unspecified: Secondary | ICD-10-CM

## 2016-01-19 DIAGNOSIS — I2510 Atherosclerotic heart disease of native coronary artery without angina pectoris: Secondary | ICD-10-CM

## 2016-01-19 DIAGNOSIS — I10 Essential (primary) hypertension: Secondary | ICD-10-CM | POA: Diagnosis not present

## 2016-01-19 DIAGNOSIS — I429 Cardiomyopathy, unspecified: Secondary | ICD-10-CM | POA: Diagnosis not present

## 2016-01-19 DIAGNOSIS — I5042 Chronic combined systolic (congestive) and diastolic (congestive) heart failure: Secondary | ICD-10-CM

## 2016-01-19 NOTE — Progress Notes (Signed)
Cardiology Office Note    Date:  01/19/2016   ID:  Marc, [name] 01-30-1951, MRN [id number]  PCP:   Melinda Crutch, MD  Cardiologist: Sinclair Grooms, MD   Chief Complaint  Patient presents with  . Coronary Artery Disease  . Congestive Heart Failure    History of Present Illness:  Marc [name] is a 65 y.o. male follow-up coronary artery disease, ischemic myopathy, hypertension, diabetes, and hyperlipidemia.  Marc [name] is doing well. He denies angina. He has had prior myocardial infarction. He has had stenting of the circumflex. He still plays racquetball several times per month. He plays games to 7 in size of 3. Total workout was approximated 45 minutes. No angina associated.    Past Medical History  Diagnosis Date  . Heart failure ([place])   . Cardiomyopathy ([place])     Left ventricle dysfunction with LVEF 35-40% by echo March 2009  . Hypercholesteremia   . Hyperlipidemia   . Coronary atherosclerosis of native coronary artery     with large anterior myocardial infarction 1997. PTCA LAD. Distal circumflex and first diagonal DES 2011  . HTN (hypertension)   . Diabetes ([place])   . Sciatica   . Hypercholesteremia   . Chest pain   . Hyperlipidemia   . Psoriasis     Past Surgical History  Procedure Laterality Date  . Coronary angioplasty with stent placement      x 2 Dr. Tamala Julian    Current Medications: Outpatient Prescriptions Prior to Visit  Medication Sig Dispense Refill  . aspirin 325 MG tablet Take 325 mg by mouth daily.    Marland Kitchen atorvastatin (LIPITOR) 40 MG tablet TAKE 1 TABLET BY MOUTH ONCE DAILY 90 tablet 1  . calcium carbonate (TUMS - DOSED IN MG ELEMENTAL CALCIUM) 500 MG chewable tablet Chew 1 tablet by mouth as needed for heartburn.    . carvedilol (COREG) 25 MG tablet Take 1 tablet (25 mg total) by mouth 2 (two) times daily with a meal. 180 tablet 0  . clopidogrel (PLAVIX) 75 MG tablet TAKE 1 TABLET BY MOUTH EVERY DAY 90 tablet 3  . fish oil-omega-3  fatty acids 1000 MG capsule Take 3 g by mouth daily.    . fluocinonide gel (LIDEX) 0.05 % Apply topically 3 (three) times a week.    Marland Kitchen glimepiride (AMARYL) 4 MG tablet Take 4 mg by mouth daily.  3  . hydrochlorothiazide (MICROZIDE) 12.5 MG capsule Take 12.5 mg by mouth daily.    . irbesartan (AVAPRO) 300 MG tablet Take 300 mg by mouth at bedtime.    . Lactase (LACTAID PO) Take 1 tablet by mouth as needed (for dairy intake).     . metFORMIN (GLUCOPHAGE) 500 MG tablet Take 1,000 mg by mouth 2 (two) times daily with a meal.     . Multiple Vitamin (MULTIVITAMIN) capsule Take 1 capsule by mouth daily.    . nitroGLYCERIN (NITROSTAT) 0.4 MG SL tablet Place 1 tablet (0.4 mg total) under the tongue every 5 (five) minutes as needed for chest pain. 25 tablet 3  . selenium sulfide (SELSUN) 2.5 % shampoo Apply topically daily as needed for itching.    Marland Kitchen ZETIA 10 MG tablet TAKE 1 TABLET BY MOUTH EVERY DAY 90 tablet 3   No facility-administered medications prior to visit.     Allergies:   Review of patient's allergies indicates no known allergies.   Social History   Social History  . Marital Status: Married  Spouse Name: N/A  . Number of Children: N/A  . Years of Education: N/A   Social History Main Topics  . Smoking status: Never Smoker   . Smokeless tobacco: Never Used  . Alcohol Use: 0.0 oz/week    0 Standard drinks or equivalent per week     Comment: occasional  . Drug Use: No  . Sexual Activity: Not Asked   Other Topics Concern  . None   Social History Narrative     Family History:  The patient's family history includes Arrhythmia in his mother; Diabetes in his mother; Healthy in his brother and sister; Heart failure in his mother; Hypertension in his mother; Prostate cancer in his brother and father; Stroke in his father; Thyroid disease in his brother.   ROS:   Please see the history of present illness.    Bilateral knee arthritis limits mobility. occasional sinus difficulty    All other systems reviewed and are negative.   PHYSICAL EXAM:   VS:  BP 138/82 mmHg  Pulse 84  Ht 5\' 7"  (1.702 m)  Wt 193 lb 3.2 oz (87.635 kg)  BMI 30.25 kg/m2   GEN: Well nourished, well developed, in no acute distress HEENT: normal Neck: no JVD, carotid bruits, or masses Cardiac: RRR; no murmurs, rubs, but an S4 gallop is heard. There is no edema . Respiratory:  clear to auscultation bilaterally, normal work of breathing GI: soft, nontender, nondistended, + BS MS: no deformity or atrophy Skin: warm and dry, no rash Neuro:  Alert and Oriented x 3, Strength and sensation are intact Psych: euthymic mood, full affect  Wt Readings from Last 3 Encounters:  01/19/16 193 lb 3.2 oz (87.635 kg)  07/03/15 192 lb (87.091 kg)  06/26/15 190 lb (86.183 kg)      Studies/Labs Reviewed:   EKG:  EKG  Normal sinus rhythm, first-degree AV block, QS pattern V1 and V2 with poor R-wave progression out to V5. Left foot axis. No change from prior tracings.  Recent Labs: 06/26/2015: ALT 24 07/19/2015: Creatinine, Ser 1.38*   Lipid Panel    Component Value Date/Time   CHOL 71* 06/26/2015 0856   TRIG 121 06/26/2015 0856   HDL 22* 06/26/2015 0856   CHOLHDL 3.2 06/26/2015 0856   VLDL 24 06/26/2015 0856   LDLCALC 25 06/26/2015 0856    Additional studies/ records that were reviewed today include:  Cardiac MRI 07/25/15:  IMPRESSION: 1) Moderate LVE. Anterior, septal and apical akinesis. Quantitative EF 46%  2) No mural apical thrombus  3) Near full thickness scar involving above walls  4) No effusion  5) Normal RV  [place]   ASSESSMENT:    1. Cardiomyopathy ([place])   2. Atherosclerosis of native coronary artery of native heart without angina pectoris   3. Essential hypertension   4. Hyperlipidemia   5. Chronic combined systolic and diastolic CHF, NYHA class 2 (HCC)      PLAN:  In order of problems listed above:  1. As noted above, LVEF is 46% as opposed to  estimated 30-35% by echo as recently as December 2016. He denies orthopnea and PND. 2. Rare episodes of angina. Remains active. No change in medical regimen is recommended. 3. Continue statin therapy. Lipid monitoring and neck visit. 4. No evidence of I'm overload. 2 g sodium restriction.    Medication Adjustments/Labs and Tests Ordered: Current medicines are reviewed at length with the patient today.  Concerns regarding medicines are outlined above.  Medication changes, Labs and Tests  ordered today are listed in the Patient Instructions below. Patient Instructions  Medication Instructions:  Your physician recommends that you continue on your current medications as directed. Please refer to the Current Medication list given to you today.   Labwork: none  Testing/Procedures: none  Follow-Up: Your physician wants you to follow-up in: 6 months.  You will receive a reminder letter in the mail two months in advance. If you don't receive a letter, please call our office to schedule the follow-up appointment.   Any Other Special Instructions Will Be Listed Below (If Applicable).     If you need a refill on your cardiac medications before your next appointment, please call your pharmacy.       Signed, Sinclair Grooms, MD  01/19/2016 4:08 PM    [facility] [place], [place], [place]  [address] Phone: [phone]; Fax: [phone]

## 2016-01-19 NOTE — Patient Instructions (Signed)

## 2016-02-07 ENCOUNTER — Other Ambulatory Visit: Payer: Self-pay | Admitting: Interventional Cardiology

## 2016-03-07 ENCOUNTER — Other Ambulatory Visit: Payer: Self-pay | Admitting: *Deleted

## 2016-03-07 MED ORDER — EZETIMIBE 10 MG PO TABS: 10.0000 mg | ORAL_TABLET | Freq: Every day | ORAL | Status: DC

## 2016-03-26 ENCOUNTER — Other Ambulatory Visit: Payer: Self-pay | Admitting: Interventional Cardiology

## 2016-06-26 ENCOUNTER — Ambulatory Visit: Payer: 59 | Admitting: Cardiology

## 2016-08-26 ENCOUNTER — Other Ambulatory Visit: Payer: Self-pay | Admitting: Interventional Cardiology

## 2016-08-26 NOTE — Telephone Encounter (Signed)
Rx refill sent to pharmacy. 

## 2016-09-09 ENCOUNTER — Encounter: Payer: Self-pay | Admitting: Interventional Cardiology

## 2016-09-09 ENCOUNTER — Ambulatory Visit (INDEPENDENT_AMBULATORY_CARE_PROVIDER_SITE_OTHER): Payer: 59 | Admitting: Interventional Cardiology

## 2016-09-09 ENCOUNTER — Encounter (INDEPENDENT_AMBULATORY_CARE_PROVIDER_SITE_OTHER): Payer: Self-pay

## 2016-09-09 VITALS — BP 120/66 | HR 86 | Ht 67.0 in | Wt 188.0 lb

## 2016-09-09 DIAGNOSIS — N183 Chronic kidney disease, stage 3 unspecified: Secondary | ICD-10-CM

## 2016-09-09 DIAGNOSIS — I5042 Chronic combined systolic (congestive) and diastolic (congestive) heart failure: Secondary | ICD-10-CM | POA: Diagnosis not present

## 2016-09-09 DIAGNOSIS — E1122 Type 2 diabetes mellitus with diabetic chronic kidney disease: Secondary | ICD-10-CM | POA: Diagnosis not present

## 2016-09-09 DIAGNOSIS — I10 Essential (primary) hypertension: Secondary | ICD-10-CM | POA: Diagnosis not present

## 2016-09-09 DIAGNOSIS — I2510 Atherosclerotic heart disease of native coronary artery without angina pectoris: Secondary | ICD-10-CM | POA: Diagnosis not present

## 2016-09-09 NOTE — Progress Notes (Signed)
Cardiology Office Note    Date:  09/09/2016   ID:  Lonzo, Saulter [birth year], MRN [id number]  PCP:  Melinda Crutch, MD  Cardiologist: Sinclair Grooms, MD   Chief Complaint  Patient presents with  . Coronary Artery Disease  . Congestive Heart Failure    History of Present Illness:  Marc [name] is a 66 y.o. male follow-up coronary artery disease (PTCA of LAD during anterior MI and stent of distal circumflex and first diagonal in 2011), anterior myocardial infarction (1997), ischemic cardiomyopathy, hypertension, diabetes2, and hyperlipidemia.  Marc [name] is doing well. He denies angina. He denies limiting dyspnea. Over the holidays he did not play racquetball and now finds that he is more short of breath after games and he was prior to Christmas. This decrease in exertional tolerance is not associated with chest discomfort. He denies orthopnea and PND. He has not needed sublingual nitroglycerin. He denies lower extremity swelling.  A myocardial perfusion study was performed last year and because of decreased LV function, a cardiac MRI was performed at the end of 2016 which revealed an LVEF of 46%. This value is more like the patient's clinical status than the ejection fraction estimated by myocardial perfusion imaging which was less than 30%.  Past Medical History:  Diagnosis Date  . Cardiomyopathy ([place])    Left ventricle dysfunction with LVEF 35-40% by echo March 2009  . Chest pain   . Coronary atherosclerosis of native coronary artery    with large anterior myocardial infarction 1997. PTCA LAD. Distal circumflex and first diagonal DES 2011  . Diabetes ([place])   . Heart failure ([place])   . HTN (hypertension)   . Hypercholesteremia   . Hypercholesteremia   . Hyperlipidemia   . Hyperlipidemia   . Psoriasis   . Sciatica     Past Surgical History:  Procedure Laterality Date  . CORONARY ANGIOPLASTY WITH STENT PLACEMENT     x 2 Dr. Tamala Julian    Current  Medications: Outpatient Medications Prior to Visit  Medication Sig Dispense Refill  . aspirin 325 MG tablet Take 325 mg by mouth daily.    Marland Kitchen atorvastatin (LIPITOR) 40 MG tablet TAKE 1 TABLET BY MOUTH ONCE DAILY 90 tablet 1  . calcium carbonate (TUMS - DOSED IN MG ELEMENTAL CALCIUM) 500 MG chewable tablet Chew 1 tablet by mouth as needed for heartburn.    . carvedilol (COREG) 25 MG tablet Take 1 tablet (25 mg total) by mouth 2 (two) times daily with a meal. 180 tablet 0  . clopidogrel (PLAVIX) 75 MG tablet TAKE 1 TABLET BY MOUTH EVERY DAY 90 tablet 3  . ezetimibe (ZETIA) 10 MG tablet Take 1 tablet (10 mg total) by mouth daily. 90 tablet 3  . fish oil-omega-3 fatty acids 1000 MG capsule Take 3 g by mouth daily.    . fluocinonide gel (LIDEX) 0.05 % Apply topically 3 (three) times a week.    Marland Kitchen glimepiride (AMARYL) 4 MG tablet Take 4 mg by mouth daily.  3  . hydrochlorothiazide (MICROZIDE) 12.5 MG capsule Take 12.5 mg by mouth daily.    . irbesartan (AVAPRO) 300 MG tablet Take 300 mg by mouth daily.     . Lactase (LACTAID PO) Take 1 tablet by mouth as needed (for dairy intake).     . metFORMIN (GLUCOPHAGE) 500 MG tablet Take 1,000 mg by mouth 2 (two) times daily with a meal.     . Multiple Vitamin (MULTIVITAMIN) capsule Take 1 capsule by  mouth daily.    Marland Kitchen NITROSTAT 0.4 MG SL tablet PLACE 1 TABLET UNDER THE TONGUE EVERY 5 MINUTES AS NEEDED FOR CHEST PAIN 25 tablet 3  . selenium sulfide (SELSUN) 2.5 % shampoo Apply topically daily as needed for itching.    . nitroGLYCERIN (NITROSTAT) 0.4 MG SL tablet Place 1 tablet (0.4 mg total) under the tongue every 5 (five) minutes as needed for chest pain. (Patient not taking: Reported on 09/09/2016) 25 tablet 3   No facility-administered medications prior to visit.      Allergies:   Patient has no known allergies.   Social History   Social History  . Marital status: Married    Spouse name: N/A  . Number of children: N/A  . Years of education: N/A    Social History Main Topics  . Smoking status: Never Smoker  . Smokeless tobacco: Never Used  . Alcohol use 0.0 oz/week     Comment: occasional  . Drug use: No  . Sexual activity: Not Asked   Other Topics Concern  . None   Social History Narrative  . None     Family History:  The patient's family history includes Arrhythmia in his mother; Diabetes in his mother; Healthy in his brother and sister; Heart failure in his mother; Hypertension in his mother; Prostate cancer in his brother and father; Stroke in his father; Thyroid disease in his brother.   ROS:   Please see the history of present illness.    Bilateral knee discomfort. Difficulty with easy weight gain. Otherwise no complaints.  All other systems reviewed and are negative.   PHYSICAL EXAM:   VS:  BP 120/66   Pulse 86   Ht 5\' 7"  (1.702 m)   Wt 188 lb (85.3 kg)   SpO2 98%   BMI 29.44 kg/m    GEN: Well nourished, well developed, in no acute distress  HEENT: normal  Neck: no JVD, carotid bruits, or masses Cardiac: RRR; no murmurs, rubs, or gallops,no edema  Respiratory:  clear to auscultation bilaterally, normal work of breathing GI: soft, nontender, nondistended, + BS MS: no deformity or atrophy  Skin: warm and dry, no rash Neuro:  Alert and Oriented x 3, Strength and sensation are intact Psych: euthymic mood, full affect  Wt Readings from Last 3 Encounters:  09/09/16 188 lb (85.3 kg)  01/19/16 193 lb 3.2 oz (87.6 kg)  07/03/15 192 lb (87.1 kg)      Studies/Labs Reviewed:   EKG:  EKG  A new tracing was not performed.  Recent Labs: No results found for requested labs within last 8760 hours.   Lipid Panel    Component Value Date/Time   CHOL 71 (L) 06/26/2015 0856   TRIG 121 06/26/2015 0856   HDL 22 (L) 06/26/2015 0856   CHOLHDL 3.2 06/26/2015 0856   VLDL 24 06/26/2015 0856   LDLCALC 25 06/26/2015 0856    Additional studies/ records that were reviewed today include:  Myocardial perfusion  imaging performed on 06/2015: Study Highlights   Nuclear stress EF: 26%.  Defect 1: There is a large defect of severe severity present in the basal inferolateral, mid anteroseptal, mid inferolateral, apical anterior, apical septal, apical inferior and apex location.  Findings consistent with prior myocardial infarction.  This is a high risk study.  The left ventricular ejection fraction is severely decreased (<30%).      CARDIAC MRI 07/21/15: IMPRESSION: 1) Moderate LVE. Anterior, septal and apical akinesis. Quantitative EF 46%  2) No mural  apical thrombus  3) Near full thickness scar involving above walls  4) No effusion  5) Normal RV   ASSESSMENT:    1. Atherosclerosis of native coronary artery of native heart without angina pectoris   2. Chronic combined systolic and diastolic CHF, NYHA class 2 ([place])   3. CKD stage 3 secondary to diabetes ([place])   4. Essential hypertension      PLAN:  In order of problems listed above:  1. No symptoms to suggest angina other than dyspnea which could be an anginal equivalent. In the past redness had chest discomfort. Continue to monitor closely for symptoms. If dyspnea doesn't improve, we may need to perform a repeat myocardial perfusion study or angiogram later this year. 2. No evidence of volume overload. Functional status is class II. Last EF assessment was by MRI revealing a value of 46%. 3. Chronic kidney disease was not addressed. 4. Blood pressure is adequately controlled. Target is 130/90 mmHg. We discussed salt restriction and compliance with medication therapy to avoid kidney impairment and progression of left ventricular dysfunction    Medication Adjustments/Labs and Tests Ordered: Current medicines are reviewed at length with the patient today.  Concerns regarding medicines are outlined above.  Medication changes, Labs and Tests ordered today are listed in the Patient Instructions below. Patient Instructions   Medication Instructions:  None  Labwork: None  Testing/Procedures: None  Follow-Up: Your physician wants you to follow-up in: 6-8 months with Dr. Tamala Julian.  You will receive a reminder letter in the mail two months in advance. If you don't receive a letter, please call our office to schedule the follow-up appointment.   Any Other Special Instructions Will Be Listed Below (If Applicable).     If you need a refill on your cardiac medications before your next appointment, please call your pharmacy.      Signed, Sinclair Grooms, MD  09/09/2016 5:59 PM    [facility] [place], [place], [place]  [address] Phone: [phone]; Fax: [phone]

## 2016-09-09 NOTE — Patient Instructions (Signed)
Medication Instructions:  None  Labwork: None  Testing/Procedures: None  Follow-Up: Your physician wants you to follow-up in: 6-8 months with Dr. Tamala Julian.  You will receive a reminder letter in the mail two months in advance. If you don't receive a letter, please call our office to schedule the follow-up appointment.   Any Other Special Instructions Will Be Listed Below (If Applicable).     If you need a refill on your cardiac medications before your next appointment, please call your pharmacy.

## 2016-10-16 ENCOUNTER — Other Ambulatory Visit: Payer: Self-pay | Admitting: Interventional Cardiology

## 2017-02-03 ENCOUNTER — Other Ambulatory Visit: Payer: Self-pay | Admitting: Interventional Cardiology

## 2017-02-08 ENCOUNTER — Other Ambulatory Visit: Payer: Self-pay | Admitting: Interventional Cardiology

## 2017-03-03 DIAGNOSIS — E1165 Type 2 diabetes mellitus with hyperglycemia: Secondary | ICD-10-CM | POA: Diagnosis not present

## 2017-03-03 DIAGNOSIS — I10 Essential (primary) hypertension: Secondary | ICD-10-CM | POA: Diagnosis not present

## 2017-03-03 DIAGNOSIS — R21 Rash and other nonspecific skin eruption: Secondary | ICD-10-CM | POA: Diagnosis not present

## 2017-03-03 DIAGNOSIS — Z7984 Long term (current) use of oral hypoglycemic drugs: Secondary | ICD-10-CM | POA: Diagnosis not present

## 2017-03-03 DIAGNOSIS — E119 Type 2 diabetes mellitus without complications: Secondary | ICD-10-CM | POA: Diagnosis not present

## 2017-03-09 NOTE — Progress Notes (Signed)
Cardiology Office Note    Date:  03/10/2017   ID:  Osiris, Odriscoll 09-10-1950, MRN [id number]  PCP:  Lawerance Cruel, MD  Cardiologist: Sinclair Grooms, MD   Chief Complaint  Patient presents with  . Coronary Artery Disease  . Shortness of Breath    History of Present Illness:  Marc [name] is a 67 y.o. male follow-up coronary artery disease (PTCA of LAD during anterior MI and stent of distal circumflex and first diagonal in 2011), anterior myocardial infarction (1997), ischemic cardiomyopathy, hypertension, diabetes mellitus type II, and hyperlipidemia.  Marc [name] is doing relatively well. He and the family has been traveling over the last 2 months. He has noticed some difficulty mowing his lawn this summer because of dyspnea and excessive fatigue. He has not needed to use sublingual nitroglycerin. He has to rest more. He states that the heat and humidity zap his energy. He denies orthopnea, PND, edema, and chest discomfort requiring attention.  Past Medical History:  Diagnosis Date  . Cardiomyopathy ([place])    Left ventricle dysfunction with LVEF 35-40% by echo March 2009  . Chest pain   . Coronary atherosclerosis of native coronary artery    with large anterior myocardial infarction 1997. PTCA LAD. Distal circumflex and first diagonal DES 2011  . Diabetes ([place])   . Heart failure ([place])   . HTN (hypertension)   . Hypercholesteremia   . Hypercholesteremia   . Hyperlipidemia   . Hyperlipidemia   . Psoriasis   . Sciatica     Past Surgical History:  Procedure Laterality Date  . CORONARY ANGIOPLASTY WITH STENT PLACEMENT     x 2 Dr. Tamala Julian    Current Medications: Outpatient Medications Prior to Visit  Medication Sig Dispense Refill  . atorvastatin (LIPITOR) 40 MG tablet TAKE 1 TABLET BY MOUTH ONCE DAILY 90 tablet 1  . calcium carbonate (TUMS - DOSED IN MG ELEMENTAL CALCIUM) 500 MG chewable tablet Chew 1 tablet by mouth as needed for heartburn.    .  carvedilol (COREG) 25 MG tablet Take 1 tablet (25 mg total) by mouth 2 (two) times daily with a meal. 180 tablet 0  . clopidogrel (PLAVIX) 75 MG tablet TAKE 1 TABLET BY MOUTH EVERY DAY 90 tablet 3  . ezetimibe (ZETIA) 10 MG tablet Take 1 tablet (10 mg total) by mouth daily. 90 tablet 3  . fish oil-omega-3 fatty acids 1000 MG capsule Take 3 g by mouth daily.    . fluocinonide gel (LIDEX) 0.05 % Apply topically 3 (three) times a week.    Marland Kitchen glimepiride (AMARYL) 4 MG tablet Take 4 mg by mouth daily.  3  . hydrochlorothiazide (MICROZIDE) 12.5 MG capsule Take 12.5 mg by mouth daily.    . irbesartan (AVAPRO) 300 MG tablet Take 300 mg by mouth daily.     . Lactase (LACTAID PO) Take 1 tablet by mouth as needed (for dairy intake).     . metFORMIN (GLUCOPHAGE) 500 MG tablet Take 1,000 mg by mouth 2 (two) times daily with a meal.     . Multiple Vitamin (MULTIVITAMIN) capsule Take 1 capsule by mouth daily.    . nitroGLYCERIN (NITROSTAT) 0.4 MG SL tablet PLACE 1 TABLET UNDER THE TONGUE EVERY 5 MINUTES AS NEEDED FOR CHEST PAIN 25 tablet 3  . selenium sulfide (SELSUN) 2.5 % shampoo Apply topically daily as needed for itching.    Marland Kitchen aspirin 325 MG tablet Take 325 mg by mouth daily.  No facility-administered medications prior to visit.      Allergies:   Patient has no known allergies.   Social History   Social History  . Marital status: Married    Spouse name: N/A  . Number of children: N/A  . Years of education: N/A   Social History Main Topics  . Smoking status: Never Smoker  . Smokeless tobacco: Never Used  . Alcohol use 0.0 oz/week     Comment: occasional  . Drug use: No  . Sexual activity: Not Asked   Other Topics Concern  . None   Social History Narrative  . None     Family History:  The patient's family history includes Arrhythmia in his mother; Diabetes in his mother; Healthy in his brother and sister; Heart failure in his mother; Hypertension in his mother; Prostate cancer in his  brother and father; Stroke in his father; Thyroid disease in his brother.   ROS:   Please see the history of present illness.    Still with intermittent knee discomfort. He is retired from his job. Increased fatigue with mowing his grass.  All other systems reviewed and are negative.   PHYSICAL EXAM:   VS:  BP 126/72 (BP Location: Left Arm)   Pulse 93   Ht 5\' 7"  (1.702 m)   Wt 187 lb 12.8 oz (85.2 kg)   BMI 29.41 kg/m    GEN: Well nourished, well developed, in no acute distress. Moderate obesity.  HEENT: normal  Neck: no JVD, carotid bruits, or masses Cardiac: RRR; no murmurs, rubs. An S4 gallop is audible. There is no edema.  Respiratory:  clear to auscultation bilaterally, normal work of breathing GI: soft, nontender, nondistended, + BS MS: no deformity or atrophy  Skin: warm and dry, no rash Neuro:  Alert and Oriented x 3, Strength and sensation are intact Psych: euthymic mood, full affect  Wt Readings from Last 3 Encounters:  03/10/17 187 lb 12.8 oz (85.2 kg)  09/09/16 188 lb (85.3 kg)  01/19/16 193 lb 3.2 oz (87.6 kg)      Studies/Labs Reviewed:   EKG:  EKG  Normal sinus rhythm, left axis deviation, poor R-wave progression V1 through V5. Moderate LVH by EKG. 20. To prior tracings, no significant change.  Recent Labs: No results found for requested labs within last 8760 hours.   Lipid Panel    Component Value Date/Time   CHOL 71 (L) 06/26/2015 0856   TRIG 121 06/26/2015 0856   HDL 22 (L) 06/26/2015 0856   CHOLHDL 3.2 06/26/2015 0856   VLDL 24 06/26/2015 0856   LDLCALC 25 06/26/2015 0856    Additional studies/ records that were reviewed today include:  Cardiac MR 07/21/2015:  IMPRESSION: 1) Moderate LVE. Anterior, septal and apical akinesis. Quantitative EF 46%   2) No mural apical thrombus   3) Near full thickness scar involving above walls   4) No effusion   5) Normal RV   ASSESSMENT:    1. Coronary artery disease involving native coronary  artery of native heart with angina pectoris ([place])   2. Chronic combined systolic and diastolic CHF, NYHA class 2 ([place])   3. Essential hypertension   4. CKD stage 3 secondary to diabetes ([place])   5. Other hyperlipidemia      PLAN:  In order of problems listed above:  1. No overt angina. There is exertion intolerance especially in very cold or warm humidity climates. I've asked that he try to avoid extremes in temperature as  much as possible. Encourage ER work be done early in the morning and with rest when necessary. Encouraged that he continues efforts to play racquetball. Notify if exertional tolerance progressively decreases. In that case he would need to have repeat angiography. 2. No evidence of volume overload. Dyspnea on exertion may be an anginal equivalent. Continue current therapy. 3. Good control of blood pressure. Target 140/90 mmHg or less. 4. Followed by primary care 5. LDL target less than 70.  Clinical follow-up in 6 months. Call if angina or increasing symptoms.  Medication Adjustments/Labs and Tests Ordered: Current medicines are reviewed at length with the patient today.  Concerns regarding medicines are outlined above.  Medication changes, Labs and Tests ordered today are listed in the Patient Instructions below. Patient Instructions  Medication Instructions:  1) DECREASE Aspirin to 81mg  once daily  Labwork: None  Testing/Procedures: None  Follow-Up: Your physician wants you to follow-up in: 6 months with Dr. Tamala Julian.  You will receive a reminder letter in the mail two months in advance. If you don't receive a letter, please call our office to schedule the follow-up appointment.    Any Other Special Instructions Will Be Listed Below (If Applicable).     If you need a refill on your cardiac medications before your next appointment, please call your pharmacy.      Signed, Sinclair Grooms, MD  03/10/2017 6:01 PM    [facility]  [facility] [address], [place], [place]  [address] Phone: [phone]; Fax: [phone]

## 2017-03-10 ENCOUNTER — Encounter: Payer: Self-pay | Admitting: Interventional Cardiology

## 2017-03-10 ENCOUNTER — Ambulatory Visit (INDEPENDENT_AMBULATORY_CARE_PROVIDER_SITE_OTHER): Payer: Medicare Other | Admitting: Interventional Cardiology

## 2017-03-10 VITALS — BP 126/72 | HR 93 | Ht 67.0 in | Wt 187.8 lb

## 2017-03-10 DIAGNOSIS — I10 Essential (primary) hypertension: Secondary | ICD-10-CM

## 2017-03-10 DIAGNOSIS — N183 Chronic kidney disease, stage 3 unspecified: Secondary | ICD-10-CM

## 2017-03-10 DIAGNOSIS — I209 Angina pectoris, unspecified: Secondary | ICD-10-CM

## 2017-03-10 DIAGNOSIS — E1122 Type 2 diabetes mellitus with diabetic chronic kidney disease: Secondary | ICD-10-CM | POA: Diagnosis not present

## 2017-03-10 DIAGNOSIS — I2510 Atherosclerotic heart disease of native coronary artery without angina pectoris: Secondary | ICD-10-CM | POA: Diagnosis not present

## 2017-03-10 DIAGNOSIS — I5042 Chronic combined systolic (congestive) and diastolic (congestive) heart failure: Secondary | ICD-10-CM

## 2017-03-10 DIAGNOSIS — E784 Other hyperlipidemia: Secondary | ICD-10-CM | POA: Diagnosis not present

## 2017-03-10 DIAGNOSIS — I25119 Atherosclerotic heart disease of native coronary artery with unspecified angina pectoris: Secondary | ICD-10-CM | POA: Diagnosis not present

## 2017-03-10 DIAGNOSIS — E7849 Other hyperlipidemia: Secondary | ICD-10-CM

## 2017-03-10 NOTE — Patient Instructions (Signed)
Medication Instructions:  1) DECREASE Aspirin to 81mg  once daily  Labwork: None  Testing/Procedures: None  Follow-Up: Your physician wants you to follow-up in: 6 months with Dr. Tamala Julian.  You will receive a reminder letter in the mail two months in advance. If you don't receive a letter, please call our office to schedule the follow-up appointment.    Any Other Special Instructions Will Be Listed Below (If Applicable).     If you need a refill on your cardiac medications before your next appointment, please call your pharmacy.

## 2017-03-31 ENCOUNTER — Other Ambulatory Visit: Payer: Self-pay | Admitting: Interventional Cardiology

## 2017-05-04 IMAGING — NM NM MYOCAR MULTI W/ SPECT
3 series · 18 of 18 positions shown · non-contrast
Comparison: none

[Series 1: rest_(id)_sa · 6.5mm · 6.51mm/px · 6 of 64 frames shown]
[frame 6/64]
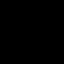
[frame 16/64]
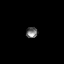
[frame 27/64]
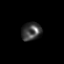
[frame 38/64]
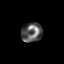
[frame 48/64]
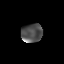
[frame 59/64]
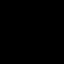

[Series 1: stress_(id)_sa · 6.5mm · 6.51mm/px · 6 of 512 frames shown (1 of 2)]
[frame 43/512]
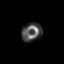
[frame 128/512]
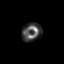
[frame 214/512]
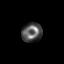
[frame 299/512]
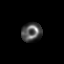
[frame 384/512]
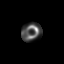
[frame 470/512]
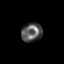

[Series 1: stress_(id)_sa · 6.5mm · 6.51mm/px · 6 of 64 frames shown (2 of 2)]
[frame 6/64]
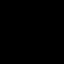
[frame 16/64]
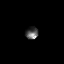
[frame 27/64]
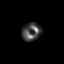
[frame 38/64]
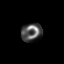
[frame 48/64]
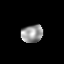
[frame 59/64]
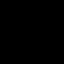

[18 of 18 positions shown; findings below may reference images not displayed]

Canned report from images found in remote index.

Refer to host system for actual result text.

## 2017-05-28 DIAGNOSIS — Z23 Encounter for immunization: Secondary | ICD-10-CM | POA: Diagnosis not present

## 2017-08-06 ENCOUNTER — Other Ambulatory Visit: Payer: Self-pay | Admitting: Interventional Cardiology

## 2017-09-18 DIAGNOSIS — Z0000 Encounter for general adult medical examination without abnormal findings: Secondary | ICD-10-CM | POA: Diagnosis not present

## 2017-09-18 DIAGNOSIS — E119 Type 2 diabetes mellitus without complications: Secondary | ICD-10-CM | POA: Diagnosis not present

## 2017-09-18 DIAGNOSIS — I10 Essential (primary) hypertension: Secondary | ICD-10-CM | POA: Diagnosis not present

## 2017-09-18 DIAGNOSIS — Z125 Encounter for screening for malignant neoplasm of prostate: Secondary | ICD-10-CM | POA: Diagnosis not present

## 2017-09-18 DIAGNOSIS — Z23 Encounter for immunization: Secondary | ICD-10-CM | POA: Diagnosis not present

## 2017-09-18 DIAGNOSIS — B351 Tinea unguium: Secondary | ICD-10-CM | POA: Diagnosis not present

## 2017-09-18 DIAGNOSIS — E785 Hyperlipidemia, unspecified: Secondary | ICD-10-CM | POA: Diagnosis not present

## 2017-09-18 DIAGNOSIS — Z1159 Encounter for screening for other viral diseases: Secondary | ICD-10-CM | POA: Diagnosis not present

## 2017-09-21 NOTE — Progress Notes (Signed)
Cardiology Office Note    Date:  09/22/2017   ID:  Marc [name], Marc [name] June 06, 1951, MRN [id number]  PCP:  Lawerance Cruel, MD  Cardiologist: Sinclair Grooms, MD   Chief Complaint  Patient presents with  . Congestive Heart Failure  . Coronary Artery Disease    History of Present Illness:  Marc [name] is a 67 y.o. male follow-up coronary artery disease with LAD PCI during late presenting anterior MI 1997, diagonal DES 2011, ischemic myopathy, hypertension, diabetes, and hyperlipidemia.  He is swimming 3 days/week.  No significant dyspnea chest discomfort is been noted.  Now he is getting back to racquetball.  Gets some dyspnea with racquetball.  This is been typical over the last several years.  Uses sublingual nitroglycerin before playing racquetball  Denies orthopnea, PND, and ankle edema.  No episodes of tachycardia.   Past Medical History:  Diagnosis Date  . Cardiomyopathy ([place])    Left ventricle dysfunction with LVEF 35-40% by echo March 2009  . Chest pain   . Coronary atherosclerosis of native coronary artery    with large anterior myocardial infarction 1997. PTCA LAD. Distal circumflex and first diagonal DES 2011  . Diabetes ([place])   . Heart failure ([place])   . HTN (hypertension)   . Hypercholesteremia   . Hypercholesteremia   . Hyperlipidemia   . Hyperlipidemia   . Psoriasis   . Sciatica     Past Surgical History:  Procedure Laterality Date  . CORONARY ANGIOPLASTY WITH STENT PLACEMENT     x 2 Dr. Tamala Julian    Current Medications: Outpatient Medications Prior to Visit  Medication Sig Dispense Refill  . atorvastatin (LIPITOR) 40 MG tablet Take 1 tablet (40 mg total) by mouth daily. 90 tablet 2  . calcium carbonate (TUMS - DOSED IN MG ELEMENTAL CALCIUM) 500 MG chewable tablet Chew 1 tablet by mouth as needed for heartburn.    . carvedilol (COREG) 25 MG tablet Take 1 tablet (25 mg total) by mouth 2 (two) times daily with a meal. 180 tablet 0  .  clopidogrel (PLAVIX) 75 MG tablet TAKE 1 TABLET BY MOUTH EVERY DAY 90 tablet 3  . ezetimibe (ZETIA) 10 MG tablet Take 1 tablet (10 mg total) by mouth daily. 90 tablet 3  . fish oil-omega-3 fatty acids 1000 MG capsule Take 3 g by mouth daily.    . fluocinonide gel (LIDEX) 4.17 % Apply 1 application topically 3 (three) times a week.     Marland Kitchen glimepiride (AMARYL) 4 MG tablet Take 4 mg by mouth daily.  3  . hydrochlorothiazide (MICROZIDE) 12.5 MG capsule Take 12.5 mg by mouth daily.    . irbesartan (AVAPRO) 300 MG tablet Take 300 mg by mouth daily.     . Lactase (LACTAID PO) Take 1 tablet by mouth as needed (for dairy intake).     . metFORMIN (GLUCOPHAGE) 500 MG tablet Take 1,000 mg by mouth 2 (two) times daily with a meal.     . Multiple Vitamin (MULTIVITAMIN) capsule Take 1 capsule by mouth daily.    . nitroGLYCERIN (NITROSTAT) 0.4 MG SL tablet PLACE 1 TABLET UNDER THE TONGUE EVERY 5 MINUTES AS NEEDED FOR CHEST PAIN 25 tablet 3  . selenium sulfide (SELSUN) 2.5 % shampoo Apply 1 application topically daily as needed for itching.      No facility-administered medications prior to visit.      Allergies:   Patient has no known allergies.   Social History  Socioeconomic History  . Marital status: Married    Spouse name: None  . Number of children: None  . Years of education: None  . Highest education level: None  Social Needs  . Financial resource strain: None  . Food insecurity - worry: None  . Food insecurity - inability: None  . Transportation needs - medical: None  . Transportation needs - non-medical: None  Occupational History  . None  Tobacco Use  . Smoking status: Never Smoker  . Smokeless tobacco: Never Used  Substance and Sexual Activity  . Alcohol use: Yes    Alcohol/week: 0.0 oz    Comment: occasional  . Drug use: No  . Sexual activity: None  Other Topics Concern  . None  Social History Narrative  . None     Family History:  The patient's family history includes  Arrhythmia in his mother; Diabetes in his mother; Healthy in his brother and sister; Heart failure in his mother; Hypertension in his mother; Prostate cancer in his brother and father; Stroke in his father; Thyroid disease in his brother.   ROS:   Please see the history of present illness.    Knee discomfort.  He has not been playing racquetball recently and therefore knee discomfort has been less severe. All other systems reviewed and are negative.   PHYSICAL EXAM:   VS:  BP 136/86   Pulse 97   Ht 5\' 7"  (1.702 m)   Wt 190 lb 12.8 oz (86.5 kg)   BMI 29.88 kg/m    GEN: Well nourished, well developed, in no acute distress  HEENT: normal  Neck: no JVD, or masses.  Left carotid bruit. Cardiac: RRR; no murmurs, rubs, or  edema.  Gallop is heard.  Likely an S4 gallop in the setting of first-degree AV block. Respiratory:  clear to auscultation bilaterally, normal work of breathing GI: soft, nontender, nondistended, + BS MS: no deformity or atrophy  Skin: warm and dry, no rash Neuro:  Alert and Oriented x 3, Strength and sensation are intact Psych: euthymic mood, full affect  Wt Readings from Last 3 Encounters:  09/22/17 190 lb 12.8 oz (86.5 kg)  03/10/17 187 lb 12.8 oz (85.2 kg)  09/09/16 188 lb (85.3 kg)      Studies/Labs Reviewed:   EKG:  EKG in July the EKG was unremarkable other than Q wave V1 through V4.  Recent Labs: No results found for requested labs within last 8760 hours.   Lipid Panel    Component Value Date/Time   CHOL 71 (L) 06/26/2015 0856   TRIG 121 06/26/2015 0856   HDL 22 (L) 06/26/2015 0856   CHOLHDL 3.2 06/26/2015 0856   VLDL 24 06/26/2015 0856   LDLCALC 25 06/26/2015 0856    Additional studies/ records that were reviewed today include:  No functional studies.    ASSESSMENT:    1. Chronic combined systolic and diastolic CHF, NYHA class 2 ([place])   2. Coronary artery disease involving native coronary artery of native heart with angina pectoris ([place])    3. Essential hypertension   4. CKD stage 3 secondary to diabetes ([place])   5. Bruit   6. Other hyperlipidemia      PLAN:  In order of problems listed above:  1. No evidence of volume overload..  Continue current therapy.  2D Doppler echocardiogram will be done this summer to assess systolic function. 2. No symptoms of angina. 3. No blood pressure medicine yet this morning, therefore blood pressure is mildly  elevated. 4. Not evaluated. 5. Left carotid bruit.  Carotid Doppler will be done. 6. LDL target less than 70.  Clinical follow-up in 6 months.  2D Doppler echocardiogram and bilateral carotid Doppler studies have been ordered.  Continue aerobic activity.  Call if symptoms of angina or reduction in exertional tolerance.  Dictation #1 [id number]  [id number]   Medication Adjustments/Labs and Tests Ordered: Current medicines are reviewed at length with the patient today.  Concerns regarding medicines are outlined above.  Medication changes, Labs and Tests ordered today are listed in the Patient Instructions below. Patient Instructions  Medication Instructions:  Your physician recommends that you continue on your current medications as directed. Please refer to the Current Medication list given to you today.  Labwork: None  Testing/Procedures: Your physician has requested that you have an echocardiogram just prior to seeing Dr. Tamala Julian back in 6 months.. Echocardiography is a painless test that uses sound waves to create images of your heart. It provides your doctor with information about the size and shape of your heart and how well your heart's chambers and valves are working. This procedure takes approximately one hour. There are no restrictions for this procedure.  Your physician has requested that you have a carotid duplex. This test is an ultrasound of the carotid arteries in your neck. It looks at blood flow through these arteries that supply the brain with blood. Allow  one hour for this exam. There are no restrictions or special instructions.    Follow-Up: Your physician wants you to follow-up in: 6 months with Dr. Tamala Julian.  You will receive a reminder letter in the mail two months in advance. If you don't receive a letter, please call our office to schedule the follow-up appointment.   Any Other Special Instructions Will Be Listed Below (If Applicable).     If you need a refill on your cardiac medications before your next appointment, please call your pharmacy.      Signed, Sinclair Grooms, MD  09/22/2017 9:33 AM    [facility] [address], [place], [place]  [address] Phone: [phone]; Fax: [phone]

## 2017-09-22 ENCOUNTER — Ambulatory Visit (INDEPENDENT_AMBULATORY_CARE_PROVIDER_SITE_OTHER): Payer: Medicare Other | Admitting: Interventional Cardiology

## 2017-09-22 ENCOUNTER — Encounter: Payer: Self-pay | Admitting: Interventional Cardiology

## 2017-09-22 VITALS — BP 136/86 | HR 97 | Ht 67.0 in | Wt 190.8 lb

## 2017-09-22 DIAGNOSIS — I209 Angina pectoris, unspecified: Secondary | ICD-10-CM | POA: Diagnosis not present

## 2017-09-22 DIAGNOSIS — I25119 Atherosclerotic heart disease of native coronary artery with unspecified angina pectoris: Secondary | ICD-10-CM

## 2017-09-22 DIAGNOSIS — I5042 Chronic combined systolic (congestive) and diastolic (congestive) heart failure: Secondary | ICD-10-CM | POA: Diagnosis not present

## 2017-09-22 DIAGNOSIS — N183 Chronic kidney disease, stage 3 (moderate): Secondary | ICD-10-CM

## 2017-09-22 DIAGNOSIS — E7849 Other hyperlipidemia: Secondary | ICD-10-CM

## 2017-09-22 DIAGNOSIS — R0989 Other specified symptoms and signs involving the circulatory and respiratory systems: Secondary | ICD-10-CM | POA: Diagnosis not present

## 2017-09-22 DIAGNOSIS — E1122 Type 2 diabetes mellitus with diabetic chronic kidney disease: Secondary | ICD-10-CM | POA: Diagnosis not present

## 2017-09-22 DIAGNOSIS — I10 Essential (primary) hypertension: Secondary | ICD-10-CM | POA: Diagnosis not present

## 2017-09-22 NOTE — Patient Instructions (Addendum)
Medication Instructions:  Your physician recommends that you continue on your current medications as directed. Please refer to the Current Medication list given to you today.  Labwork: None  Testing/Procedures: Your physician has requested that you have an echocardiogram just prior to seeing Dr. Tamala Julian back in 6 months.. Echocardiography is a painless test that uses sound waves to create images of your heart. It provides your doctor with information about the size and shape of your heart and how well your heart's chambers and valves are working. This procedure takes approximately one hour. There are no restrictions for this procedure.  Your physician has requested that you have a carotid duplex. This test is an ultrasound of the carotid arteries in your neck. It looks at blood flow through these arteries that supply the brain with blood. Allow one hour for this exam. There are no restrictions or special instructions.    Follow-Up: Your physician wants you to follow-up in: 6 months with Dr. Tamala Julian.  You will receive a reminder letter in the mail two months in advance. If you don't receive a letter, please call our office to schedule the follow-up appointment.   Any Other Special Instructions Will Be Listed Below (If Applicable).     If you need a refill on your cardiac medications before your next appointment, please call your pharmacy.

## 2017-10-22 ENCOUNTER — Other Ambulatory Visit: Payer: Self-pay | Admitting: Interventional Cardiology

## 2018-01-30 ENCOUNTER — Other Ambulatory Visit: Payer: Self-pay | Admitting: Interventional Cardiology

## 2018-03-23 DIAGNOSIS — R21 Rash and other nonspecific skin eruption: Secondary | ICD-10-CM | POA: Diagnosis not present

## 2018-03-23 DIAGNOSIS — E119 Type 2 diabetes mellitus without complications: Secondary | ICD-10-CM | POA: Diagnosis not present

## 2018-03-23 DIAGNOSIS — B351 Tinea unguium: Secondary | ICD-10-CM | POA: Diagnosis not present

## 2018-03-23 DIAGNOSIS — M25562 Pain in left knee: Secondary | ICD-10-CM | POA: Diagnosis not present

## 2018-03-23 DIAGNOSIS — I10 Essential (primary) hypertension: Secondary | ICD-10-CM | POA: Diagnosis not present

## 2018-03-24 ENCOUNTER — Ambulatory Visit (HOSPITAL_COMMUNITY): Payer: Medicare Other | Attending: Interventional Cardiology

## 2018-03-24 ENCOUNTER — Other Ambulatory Visit: Payer: Self-pay

## 2018-03-24 DIAGNOSIS — I340 Nonrheumatic mitral (valve) insufficiency: Secondary | ICD-10-CM | POA: Insufficient documentation

## 2018-03-24 DIAGNOSIS — E785 Hyperlipidemia, unspecified: Secondary | ICD-10-CM | POA: Insufficient documentation

## 2018-03-24 DIAGNOSIS — I130 Hypertensive heart and chronic kidney disease with heart failure and stage 1 through stage 4 chronic kidney disease, or unspecified chronic kidney disease: Secondary | ICD-10-CM | POA: Diagnosis not present

## 2018-03-24 DIAGNOSIS — I5042 Chronic combined systolic (congestive) and diastolic (congestive) heart failure: Secondary | ICD-10-CM | POA: Diagnosis not present

## 2018-03-24 DIAGNOSIS — I429 Cardiomyopathy, unspecified: Secondary | ICD-10-CM | POA: Diagnosis not present

## 2018-03-24 DIAGNOSIS — E1122 Type 2 diabetes mellitus with diabetic chronic kidney disease: Secondary | ICD-10-CM | POA: Diagnosis not present

## 2018-03-24 DIAGNOSIS — N183 Chronic kidney disease, stage 3 (moderate): Secondary | ICD-10-CM | POA: Insufficient documentation

## 2018-03-24 DIAGNOSIS — I2510 Atherosclerotic heart disease of native coronary artery without angina pectoris: Secondary | ICD-10-CM | POA: Insufficient documentation

## 2018-03-24 MED ORDER — PERFLUTREN LIPID MICROSPHERE
1.0000 mL | INTRAVENOUS | Status: AC | PRN
Start: 2018-03-24 — End: 2018-03-24
  Administered 2018-03-24: 2 mL via INTRAVENOUS

## 2018-03-26 ENCOUNTER — Ambulatory Visit (HOSPITAL_COMMUNITY)
Admission: RE | Admit: 2018-03-26 | Discharge: 2018-03-26 | Disposition: A | Discharge status: Home / Self Care | Payer: Medicare Other | Source: Ambulatory Visit | Attending: Cardiology | Admitting: Cardiology

## 2018-03-26 DIAGNOSIS — R0989 Other specified symptoms and signs involving the circulatory and respiratory systems: Secondary | ICD-10-CM

## 2018-03-27 ENCOUNTER — Other Ambulatory Visit: Payer: Self-pay

## 2018-03-27 DIAGNOSIS — I6521 Occlusion and stenosis of right carotid artery: Secondary | ICD-10-CM

## 2018-03-30 ENCOUNTER — Telehealth: Payer: Self-pay | Admitting: *Deleted

## 2018-03-30 DIAGNOSIS — I5042 Chronic combined systolic (congestive) and diastolic (congestive) heart failure: Secondary | ICD-10-CM

## 2018-03-30 MED ORDER — SACUBITRIL-VALSARTAN 24-26 MG PO TABS: 1.0000 | ORAL_TABLET | Freq: Two times a day (BID) | ORAL | 11 refills | Status: DC

## 2018-03-30 NOTE — Telephone Encounter (Signed)
Spoke with pt and went over results and recommendations per Dr. Tamala Julian.  Educated pt on stopped Avapro and starting Entresto.  Scheduled pt to see PharmD and have labs drawn first available on 9/4.  Scheduled pt to see Dr. Tamala Julian on 9/19.  Advised pt to monitor BP and let us know if it gets too low and/or he develops sx.  Pt verbalized understanding and was in agreement with this plan.

## 2018-03-30 NOTE — Telephone Encounter (Signed)
-----   Message from Belva Crome, MD sent at 03/29/2018  7:26 PM EDT ----- Let the patient know the LVEF is slightly lower than prior evaluation with EF 35-40% (MRI 46%). Increase medical protection against further decline: stop Avapro and start Entresto 24/25 mg BID for 2 weeks then [facility] for up-titration two weeks later with BMET also in 2 weeks. I need to see 2-3 weeks thereafter to consider adding Insra or aldactone. A copy will be sent to Lawerance Cruel, MD

## 2018-04-07 DIAGNOSIS — H2513 Age-related nuclear cataract, bilateral: Secondary | ICD-10-CM | POA: Diagnosis not present

## 2018-04-22 ENCOUNTER — Encounter: Payer: Self-pay | Admitting: Pharmacist

## 2018-04-22 ENCOUNTER — Ambulatory Visit (INDEPENDENT_AMBULATORY_CARE_PROVIDER_SITE_OTHER): Payer: Medicare Other | Admitting: Pharmacist

## 2018-04-22 ENCOUNTER — Other Ambulatory Visit: Payer: Medicare Other | Admitting: *Deleted

## 2018-04-22 VITALS — BP 118/72 | HR 78

## 2018-04-22 DIAGNOSIS — I5042 Chronic combined systolic (congestive) and diastolic (congestive) heart failure: Secondary | ICD-10-CM

## 2018-04-22 DIAGNOSIS — I6521 Occlusion and stenosis of right carotid artery: Secondary | ICD-10-CM | POA: Diagnosis not present

## 2018-04-22 LAB — BASIC METABOLIC PANEL
BUN / CREAT RATIO: 15 (ref 10–24)
BUN: 22 mg/dL (ref 8–27)
CO2: 20 mmol/L (ref 20–29)
CREATININE: 1.43 mg/dL — AB (ref 0.76–1.27)
Calcium: 9.5 mg/dL (ref 8.6–10.2)
Chloride: 105 mmol/L (ref 96–106)
GFR, EST AFRICAN AMERICAN: 59 mL/min/{1.73_m2} — AB (ref >59)
GFR, EST NON AFRICAN AMERICAN: 51 mL/min/{1.73_m2} — AB (ref >59)
Glucose: 209 mg/dL — ABNORMAL HIGH (ref 65–99)
Potassium: 4.6 mmol/L (ref 3.5–5.2)
SODIUM: 140 mmol/L (ref 134–144)

## 2018-04-22 NOTE — Patient Instructions (Addendum)
Return for a follow up appointment in 3-4 weeks with Dr. Tamala Julian  Go to the lab today for blood work  Your blood pressure goal is less than 130/80  Check your blood pressure at home daily (if able) and keep record of the readings.  Take your BP meds as follows: CONTINUE carvedilol 25mg  twice daily INCREASE Entresto 49/51mg  twice daily   STOP valsartan   Bring all of your meds, your BP cuff and your record of home blood pressures to your next appointment.  Exercise as you're able, try to walk approximately 30 minutes per day.  Keep salt intake to a minimum, especially watch canned and prepared boxed foods.  Eat more fresh fruits and vegetables and fewer canned items.  Avoid eating in fast food restaurants.    HOW TO TAKE YOUR BLOOD PRESSURE: . Rest 5 minutes before taking your blood pressure. .  Don't smoke or drink caffeinated beverages for at least 30 minutes before. . Take your blood pressure before (not after) you eat. . Sit comfortably with your back supported and both feet on the floor (don't cross your legs). . Elevate your arm to heart level on a table or a desk. . Use the proper sized cuff. It should fit smoothly and snugly around your bare upper arm. There should be enough room to slip a fingertip under the cuff. The bottom edge of the cuff should be 1 inch above the crease of the elbow. . Ideally, take 3 measurements at one sitting and record the average.

## 2018-04-22 NOTE — Progress Notes (Signed)
Patient ID: Marc [name]                 DOB: January 27, 1951                      MRN: [id number]     HPI: Marc [name] is a 67 y.o. male patient of Dr. Tamala Julian who presents today for medication titration. PMH significant for coronary artery disease with LAD PCI during late presenting anterior MI 1997, diagonal DES 2011, ischemic myopathy, hypertension, diabetes, and hyperlipidemia. His recent ECHO revealed slightly worsed EF to 35-40%. His Avapro was changed to [facility].   He presents for medication titration. He NTG takes when he cuts the yard. He denies chest pain and SOB. He does report some constipation. He is still able to go just a little more difficult than before.  During the medication review it is uncovered that his primary care physician started him on valsartan 320mg  daily. He has been taking this in combination with his Entresto.   Current HTN meds:  Carvedilol 25mg  BID  Hydrochlorothiazide 12.5mg  daily - stopped by primary care Entresto 24/26mg  BID  Valsartan 320mg  daily - started by primary care when HCTZ stopped   Previously tried:   BP goal: <130/80  Family History: Arrhythmia in his mother; Diabetes in his mother; Healthy in his brother and sister; Heart failure in his mother; Hypertension in his mother; Prostate cancer in his brother and father; Stroke in his father; Thyroid disease in his brother.   Social History: denies tobacco, occasional alcohol  Diet: Most meals from home. He does eat out for lunch at [facility]. He eats peanut butter or [place] sandwich with lettuce tomato and light mayo on whole wheat. He drinks mostly water or diet lemonade. He does not drink soda. He drinks decaf coffee once every few weeks.   Exercise: He swims 3-4 times per weeks for 30 minutes.   Home BP readings: 97-115/61-67  Wt Readings from Last 3 Encounters:  09/22/17 190 lb 12.8 oz (86.5 kg)  03/10/17 187 lb 12.8 oz (85.2 kg)  09/09/16 188 lb (85.3 kg)   BP  Readings from Last 3 Encounters:  04/22/18 118/72  09/22/17 136/86  03/10/17 126/72   Pulse Readings from Last 3 Encounters:  04/22/18 78  09/22/17 97  03/10/17 93    Renal function: CrCl cannot be calculated (Unknown ideal weight.).  Past Medical History:  Diagnosis Date  . Cardiomyopathy ([place])    Left ventricle dysfunction with LVEF 35-40% by echo March 2009  . Chest pain   . Coronary atherosclerosis of native coronary artery    with large anterior myocardial infarction 1997. PTCA LAD. Distal circumflex and first diagonal DES 2011  . Diabetes ([place])   . Heart failure ([place])   . HTN (hypertension)   . Hypercholesteremia   . Hypercholesteremia   . Hyperlipidemia   . Hyperlipidemia   . Psoriasis   . Sciatica     Current Outpatient Medications on File Prior to Visit  Medication Sig Dispense Refill  . aspirin EC 81 MG tablet Take 81 mg by mouth daily.    Marland Kitchen atorvastatin (LIPITOR) 40 MG tablet Take 1 tablet (40 mg total) by mouth daily. 90 tablet 2  . calcium carbonate (TUMS - DOSED IN MG ELEMENTAL CALCIUM) 500 MG chewable tablet Chew 1 tablet by mouth as needed for heartburn.    . carvedilol (COREG) 25 MG tablet Take 1 tablet (25 mg total) by mouth 2 (  two) times daily with a meal. 180 tablet 0  . clopidogrel (PLAVIX) 75 MG tablet TAKE 1 TABLET BY MOUTH EVERY DAY 90 tablet 2  . diclofenac sodium (VOLTAREN) 1 % GEL Apply 2 g topically 4 (four) times daily. PRN knee pain    . ezetimibe (ZETIA) 10 MG tablet Take 1 tablet (10 mg total) by mouth daily. 90 tablet 3  . fish oil-omega-3 fatty acids 1000 MG capsule Take 3 g by mouth daily.    . fluocinonide gel (LIDEX) 1.10 % Apply 1 application topically 3 (three) times a week.     Marland Kitchen glimepiride (AMARYL) 4 MG tablet Take 4 mg by mouth daily.  3  . Lactase (LACTAID PO) Take 1 tablet by mouth as needed (for dairy intake).     . metFORMIN (GLUCOPHAGE) 500 MG tablet Take 1,000 mg by mouth 2 (two) times daily with a meal.     . Multiple  Vitamin (MULTIVITAMIN) capsule Take 1 capsule by mouth daily.    . nitroGLYCERIN (NITROSTAT) 0.4 MG SL tablet PLACE 1 TABLET UNDER THE TONGUE EVERY 5 MINUTES AS NEEDED FOR CHEST PAIN 25 tablet 2  . selenium sulfide (SELSUN) 2.5 % shampoo Apply 1 application topically daily as needed for itching.     . sacubitril-valsartan (ENTRESTO) 49-51 MG Take 1 tablet by mouth 2 (two) times daily. 60 tablet 0   No current facility-administered medications on file prior to visit.     No Known Allergies  Blood pressure 118/72, pulse 78.   Assessment/Plan: Hypertension: BMET today. BP today is below goal. Will stop valsartan as therapeutic duplication. Will increase Entresto to mid strength pending BMET results. Continue Carvedilol 25mg  BID. Would consider addition of spironolactone vs Entresto titration at follow up visit with Dr. Tamala Julian in 2 weeks. Follow up in HTN clinic as needed for additional medication titration   Thank you, Lelan Pons. Patterson Hammersmith, [facility]  04/22/2018 9:33 PM  ADDENDUM: BMET stable with previous. Would anticipate some stress on kidneys given taking more than maximum recommended dose of valsartan. Repeat in 4 weeks per Dr. Tamala Julian.

## 2018-04-23 ENCOUNTER — Telehealth: Payer: Self-pay | Admitting: *Deleted

## 2018-04-23 DIAGNOSIS — I5042 Chronic combined systolic (congestive) and diastolic (congestive) heart failure: Secondary | ICD-10-CM

## 2018-04-23 NOTE — Telephone Encounter (Signed)
Spoke with pt and went over results and recommendations.  Pt will come for labs on 05/26/18.  Pt appreciative for call.

## 2018-04-23 NOTE — Telephone Encounter (Signed)
-----   Message from Belva Crome, MD sent at 04/23/2018  8:22 AM EDT ----- Let the patient know labs are stable but needs BMET 1 month A copy will be sent to Lawerance Cruel, MD

## 2018-04-28 ENCOUNTER — Other Ambulatory Visit: Payer: Self-pay | Admitting: Interventional Cardiology

## 2018-04-29 DIAGNOSIS — Z23 Encounter for immunization: Secondary | ICD-10-CM | POA: Diagnosis not present

## 2018-05-06 NOTE — Progress Notes (Signed)
Cardiology Office Note:    Date:  05/07/2018   ID:  Marc [name], DOB Dec 05, 1950, MRN [id number]  PCP:  Lawerance Cruel, MD  Cardiologist:  Sinclair Grooms, MD   Referring MD: Lawerance Cruel, MD   Chief Complaint  Patient presents with  . Congestive Heart Failure  . Coronary Artery Disease    History of Present Illness:    Marc [name] is a 67 y.o. male with a hx of coronary artery disease with LAD PCI during late presenting anterior MI 1997, diagonal DES 2011, ischemic myopathy, hypertension, diabetes, and hyperlipidemia.   Doing well.  Somewhat quizzical about medication adjustments are being made.  We spent time discussing the mechanism of protection with Entresto and mineralocorticoid receptor antagonist therapy.  We discussed the objective being to prevent progressive LV enlargement and decreased contractility.  With the addition of Entresto and up titration he has tolerated it well.  He does note some mild constipation.  He denies syncope, palpitations, edema, and angina.   Past Medical History:  Diagnosis Date  . Cardiomyopathy ([place])    Left ventricle dysfunction with LVEF 35-40% by echo March 2009  . Chest pain   . Coronary atherosclerosis of native coronary artery    with large anterior myocardial infarction 1997. PTCA LAD. Distal circumflex and first diagonal DES 2011  . Diabetes ([place])   . Heart failure ([place])   . HTN (hypertension)   . Hypercholesteremia   . Hypercholesteremia   . Hyperlipidemia   . Hyperlipidemia   . Psoriasis   . Sciatica     Past Surgical History:  Procedure Laterality Date  . CORONARY ANGIOPLASTY WITH STENT PLACEMENT     x 2 Dr. Tamala Julian    Current Medications: Current Meds  Medication Sig  . aspirin EC 81 MG tablet Take 81 mg by mouth daily.  Marland Kitchen atorvastatin (LIPITOR) 40 MG tablet Take 1 tablet (40 mg total) by mouth daily.  . calcium carbonate (TUMS - DOSED IN MG ELEMENTAL CALCIUM) 500 MG chewable tablet Chew 1  tablet by mouth as needed for heartburn.  . carvedilol (COREG) 25 MG tablet Take 1 tablet (25 mg total) by mouth 2 (two) times daily with a meal.  . clopidogrel (PLAVIX) 75 MG tablet TAKE 1 TABLET BY MOUTH EVERY DAY  . diclofenac sodium (VOLTAREN) 1 % GEL Apply 2 g topically 4 (four) times daily. PRN knee pain  . ezetimibe (ZETIA) 10 MG tablet TAKE 1 TABLET BY MOUTH EVERY DAY  . fish oil-omega-3 fatty acids 1000 MG capsule Take 3 g by mouth daily.  . fluocinonide gel (LIDEX) 3.01 % Apply 1 application topically 3 (three) times a week.   Marland Kitchen glimepiride (AMARYL) 4 MG tablet Take 4 mg by mouth daily.  . Lactase (LACTAID PO) Take 1 tablet by mouth as needed (for dairy intake).   . metFORMIN (GLUCOPHAGE) 500 MG tablet Take 1,000 mg by mouth 2 (two) times daily with a meal.   . Multiple Vitamin (MULTIVITAMIN) capsule Take 1 capsule by mouth daily.  . nitroGLYCERIN (NITROSTAT) 0.4 MG SL tablet PLACE 1 TABLET UNDER THE TONGUE EVERY 5 MINUTES AS NEEDED FOR CHEST PAIN  . sacubitril-valsartan (ENTRESTO) 49-51 MG Take 1 tablet by mouth 2 (two) times daily.  Marland Kitchen selenium sulfide (SELSUN) 2.5 % shampoo Apply 1 application topically daily as needed for itching.   . [DISCONTINUED] sacubitril-valsartan (ENTRESTO) 49-51 MG Take 1 tablet by mouth 2 (two) times daily.     Allergies:  Patient has no known allergies.   Social History   Socioeconomic History  . Marital status: Married    Spouse name: Not on file  . Number of children: Not on file  . Years of education: Not on file  . Highest education level: Not on file  Occupational History  . Not on file  Social Needs  . Financial resource strain: Not on file  . Food insecurity:    Worry: Not on file    Inability: Not on file  . Transportation needs:    Medical: Not on file    Non-medical: Not on file  Tobacco Use  . Smoking status: Never Smoker  . Smokeless tobacco: Never Used  Substance and Sexual Activity  . Alcohol use: Yes    Alcohol/week:  0.0 standard drinks    Comment: occasional  . Drug use: No  . Sexual activity: Not on file  Lifestyle  . Physical activity:    Days per week: Not on file    Minutes per session: Not on file  . Stress: Not on file  Relationships  . Social connections:    Talks on phone: Not on file    Gets together: Not on file    Attends religious service: Not on file    Active member of club or organization: Not on file    Attends meetings of clubs or organizations: Not on file    Relationship status: Not on file  Other Topics Concern  . Not on file  Social History Narrative  . Not on file     Family History: The patient's family history includes Arrhythmia in his mother; Diabetes in his mother; Healthy in his brother and sister; Heart failure in his mother; Hypertension in his mother; Prostate cancer in his brother and father; Stroke in his father; Thyroid disease in his brother.  ROS:   Please see the history of present illness.    None all other systems reviewed and are negative.  EKGs/Labs/Other Studies Reviewed:    The following studies were reviewed today: No new data  EKG:  EKG is  ordered today.  The ekg ordered today demonstrates sinus rhythm, first-degree AV block 236 ms, left anterior hemiblock, and biatrial abnormality.  Recent Labs: 04/22/2018: BUN 22; Creatinine, Ser 1.43; Potassium 4.6; Sodium 140  Recent Lipid Panel    Component Value Date/Time   CHOL 71 (L) 06/26/2015 0856   TRIG 121 06/26/2015 0856   HDL 22 (L) 06/26/2015 0856   CHOLHDL 3.2 06/26/2015 0856   VLDL 24 06/26/2015 0856   LDLCALC 25 06/26/2015 0856    Physical Exam:    VS:  BP 126/78   Pulse 83   Ht 5\' 6"  (1.676 m)   Wt 188 lb (85.3 kg)   SpO2 98%   BMI 30.34 kg/m     Wt Readings from Last 3 Encounters:  05/07/18 188 lb (85.3 kg)  09/22/17 190 lb 12.8 oz (86.5 kg)  03/10/17 187 lb 12.8 oz (85.2 kg)     GEN: Moderate abdominal obesity well nourished, well developed in no acute  distress HEENT: Normal NECK: No JVD. LYMPHATICS: No lymphadenopathy CARDIAC: RRR, 2/6 apical systolic murmur, S4 gallop, none edema. VASCULAR: 2+ bilateral pulses.  None bruits. RESPIRATORY:  Clear to auscultation without rales, wheezing or rhonchi  ABDOMEN: Soft, non-tender, non-distended, No pulsatile mass, MUSCULOSKELETAL: No deformity  SKIN: Warm and dry NEUROLOGIC:  Alert and oriented x 3 PSYCHIATRIC:  Normal affect   ASSESSMENT:    1.  Chronic combined systolic and diastolic CHF, NYHA class 2 ([place])   2. CKD stage 3 secondary to diabetes ([place])   3. Coronary artery disease involving native coronary artery of native heart with angina pectoris ([place])   4. Type 2 diabetes mellitus with other circulatory complication, without long-term current use of insulin ([facility])   5. Other hyperlipidemia    PLAN:    In order of problems listed above:  1. In the process of updating heart failure therapy based upon current guidelines.  He is on max dose Coreg, Entresto moderate range dose, and if BUN and creatinine/potassium are normal will start low-dose mineralocorticoid receptor blocker (Aldactone 12.5 mg/day).  Basic metabolic panel is being done today.  Basic metabolic panel will be repeated 7 to 10 days after starting Aldactone. 2. Kidney function is normal 3. No anginal complaints 4. He is on good therapy for diabetes.  Hemoglobin A1c target is less than 7.  Most recently in August it was 8.5.  Recommend consideration of SGLT - 2 therapy("-flozin") for better control and cardioprotection 5. LDL target less than 70.  Most recently 46 in January 2019.  Overall Mr. Chachere is doing well.  We have adjusted his medication regimen to be up-to-date with current guidelines.  A1c is too high.  If there are no contraindications, the addition of an SGLT2 would give additional cardioprotection against heart failure.  Mineralocorticoid receptor antagonist will be added if today's blood work is reasonable.   Clinical follow-up in 4 to 6 months.  Basic metabolic panel in 10 to 14 days after starting Aldactone and then 6 weeks thereafter.  Monitor blood pressure at home and notify if systolic pressures are 923 mmHg or less.  Prolonged OV lasting 40 minutes with greater than 50% of time spent in counseling.  Medication Adjustments/Labs and Tests Ordered: Current medicines are reviewed at length with the patient today.  Concerns regarding medicines are outlined above.  Orders Placed This Encounter  Procedures  . Basic metabolic panel  . EKG 12-Lead   Meds ordered this encounter  Medications  . sacubitril-valsartan (ENTRESTO) 49-51 MG    Sig: Take 1 tablet by mouth 2 (two) times daily.    Dispense:  60 tablet    Refill:  11    Patient Instructions  Medication Instructions:  Your physician recommends that you continue on your current medications as directed. Please refer to the Current Medication list given to you today.  Labwork: BMET today.  You are also scheduled to have a repeat BMET on October 11th.  You can come anytime between 8A-5P.  Testing/Procedures: None  Follow-Up: Your physician wants you to follow-up in: 6 months with Dr. Tamala Julian.  You will receive a reminder letter in the mail two months in advance. If you don't receive a letter, please call our office to schedule the follow-up appointment.   Any Other Special Instructions Will Be Listed Below (If Applicable).     If you need a refill on your cardiac medications before your next appointment, please call your pharmacy.      Signed, Sinclair Grooms, MD  05/07/2018 1:04 PM    [place]

## 2018-05-07 ENCOUNTER — Encounter: Payer: Self-pay | Admitting: Interventional Cardiology

## 2018-05-07 ENCOUNTER — Ambulatory Visit (INDEPENDENT_AMBULATORY_CARE_PROVIDER_SITE_OTHER): Payer: Medicare Other | Admitting: Interventional Cardiology

## 2018-05-07 VITALS — BP 126/78 | HR 83 | Ht 66.0 in | Wt 188.0 lb

## 2018-05-07 DIAGNOSIS — E7849 Other hyperlipidemia: Secondary | ICD-10-CM

## 2018-05-07 DIAGNOSIS — N183 Chronic kidney disease, stage 3 unspecified: Secondary | ICD-10-CM

## 2018-05-07 DIAGNOSIS — E1122 Type 2 diabetes mellitus with diabetic chronic kidney disease: Secondary | ICD-10-CM

## 2018-05-07 DIAGNOSIS — E1159 Type 2 diabetes mellitus with other circulatory complications: Secondary | ICD-10-CM

## 2018-05-07 DIAGNOSIS — I6521 Occlusion and stenosis of right carotid artery: Secondary | ICD-10-CM

## 2018-05-07 DIAGNOSIS — I5042 Chronic combined systolic (congestive) and diastolic (congestive) heart failure: Secondary | ICD-10-CM

## 2018-05-07 DIAGNOSIS — I25119 Atherosclerotic heart disease of native coronary artery with unspecified angina pectoris: Secondary | ICD-10-CM | POA: Diagnosis not present

## 2018-05-07 MED ORDER — SACUBITRIL-VALSARTAN 49-51 MG PO TABS: 1.0000 | ORAL_TABLET | Freq: Two times a day (BID) | ORAL | 11 refills | Status: DC

## 2018-05-07 NOTE — Patient Instructions (Signed)
Medication Instructions:  Your physician recommends that you continue on your current medications as directed. Please refer to the Current Medication list given to you today.  Labwork: BMET today.  You are also scheduled to have a repeat BMET on October 11th.  You can come anytime between 8A-5P.  Testing/Procedures: None  Follow-Up: Your physician wants you to follow-up in: 6 months with Dr. Tamala Julian.  You will receive a reminder letter in the mail two months in advance. If you don't receive a letter, please call our office to schedule the follow-up appointment.   Any Other Special Instructions Will Be Listed Below (If Applicable).     If you need a refill on your cardiac medications before your next appointment, please call your pharmacy.

## 2018-05-08 LAB — BASIC METABOLIC PANEL
BUN / CREAT RATIO: 16 (ref 10–24)
BUN: 20 mg/dL (ref 8–27)
CO2: 22 mmol/L (ref 20–29)
CREATININE: 1.25 mg/dL (ref 0.76–1.27)
Calcium: 9.6 mg/dL (ref 8.6–10.2)
Chloride: 104 mmol/L (ref 96–106)
GFR calc Af Amer: 69 mL/min/{1.73_m2} (ref >59)
GFR calc non Af Amer: 60 mL/min/{1.73_m2} (ref >59)
GLUCOSE: 160 mg/dL — AB (ref 65–99)
Potassium: 4.6 mmol/L (ref 3.5–5.2)
SODIUM: 141 mmol/L (ref 134–144)

## 2018-05-25 ENCOUNTER — Telehealth: Payer: Self-pay | Admitting: Interventional Cardiology

## 2018-05-25 NOTE — Telephone Encounter (Signed)
Pt can try Pepcid (famotidine) over the counter 10mg  twice daily as needed for heartburn. If symptoms persist, he can increase dosing to 20mg  twice a day. If this still does not work after a few weeks, would consider trial of PPI like pantoprazole (avoid omeprazole since he takes clopidogrel). Tums still ok to use as needed for quick relief as well.

## 2018-05-25 NOTE — Telephone Encounter (Signed)
New Message:     Patient is requesting a return call due to heartburn

## 2018-05-25 NOTE — Telephone Encounter (Signed)
Spoke with patient and gave him Megan's recommendations.  He will start with the Pepcid 10 mg bid and let us know how he is doing.  He was thankful for the call.

## 2018-05-25 NOTE — Telephone Encounter (Signed)
Spoke to patient who has experienced "Heartburn/Acid Reflux" the past 3 evenings.  He is fine during the day, but at night when he lays down, he is miserable.  He was inquiring about medication that he could use to relieve this, given his recent start on Entresto.  Please advise, thank you

## 2018-05-26 ENCOUNTER — Other Ambulatory Visit: Payer: Medicare Other

## 2018-05-29 ENCOUNTER — Other Ambulatory Visit: Payer: PRIVATE HEALTH INSURANCE | Admitting: *Deleted

## 2018-05-29 DIAGNOSIS — I5042 Chronic combined systolic (congestive) and diastolic (congestive) heart failure: Secondary | ICD-10-CM | POA: Diagnosis not present

## 2018-05-29 LAB — BASIC METABOLIC PANEL
BUN/Creatinine Ratio: 16 (ref 10–24)
BUN: 24 mg/dL (ref 8–27)
CALCIUM: 8.6 mg/dL (ref 8.6–10.2)
CHLORIDE: 102 mmol/L (ref 96–106)
CO2: 19 mmol/L — ABNORMAL LOW (ref 20–29)
CREATININE: 1.52 mg/dL — AB (ref 0.76–1.27)
GFR calc non Af Amer: 47 mL/min/{1.73_m2} — ABNORMAL LOW (ref >59)
GFR, EST AFRICAN AMERICAN: 54 mL/min/{1.73_m2} — AB (ref >59)
Glucose: 198 mg/dL — ABNORMAL HIGH (ref 65–99)
Potassium: 4.3 mmol/L (ref 3.5–5.2)
Sodium: 138 mmol/L (ref 134–144)

## 2018-06-01 ENCOUNTER — Telehealth: Payer: Self-pay | Admitting: Interventional Cardiology

## 2018-06-01 ENCOUNTER — Telehealth: Payer: Self-pay | Admitting: *Deleted

## 2018-06-01 DIAGNOSIS — Z79899 Other long term (current) drug therapy: Secondary | ICD-10-CM

## 2018-06-01 MED ORDER — NITROGLYCERIN 0.4 MG SL SUBL: SUBLINGUAL_TABLET | SUBLINGUAL | 3 refills | Status: DC

## 2018-06-01 NOTE — Telephone Encounter (Signed)
Start furosemide 20 mg daily and check BMET Friday. Cancel previously ordered BMET for 3 weeks.

## 2018-06-01 NOTE — Telephone Encounter (Signed)
Working in [employer].  Called pt re: lab results.  Left a message for pt to call back.

## 2018-06-01 NOTE — Telephone Encounter (Signed)
° ° °  Pt c/o Shortness Of Breath: STAT if SOB developed within the last 24 hours or pt is noticeably SOB on the phone  1. Are you currently SOB (can you hear that pt is SOB on the phone)? no  2. How long have you been experiencing SOB? Last week  3. Are you SOB when sitting or when up moving around? When laying down  4. Are you currently experiencing any other symptoms? no

## 2018-06-01 NOTE — Telephone Encounter (Signed)
Pt returned my call and he has been made aware of his lab results. He will repeat BMET 06/15/18

## 2018-06-01 NOTE — Telephone Encounter (Signed)
-----   Message from Belva Crome, MD sent at 05/31/2018  3:57 PM EDT ----- Let the patient know slight increase in creatinine. Repeat in 2 weeks A copy will be sent to Lawerance Cruel, MD

## 2018-06-01 NOTE — Telephone Encounter (Signed)
Patient is calling because he recently started Entresto (1 month ago).  He takes Pepcid for acid reflux nightly and becomes SOB when laying down.  He is not able to get much sleep.  He is fine throughout the day, but this mainly occurs at night.    This past Thursday and Friday, he did not take the Pepcid and felt fine through the night and slept well with no SOB.  Saturday night he had ribs for dinner, had to take Pepcid, could not sleep and was SOB.  He is calling for medical advice.  Thank you.

## 2018-06-02 MED ORDER — FUROSEMIDE 20 MG PO TABS: 20.0000 mg | ORAL_TABLET | Freq: Every day | ORAL | 3 refills | Status: DC

## 2018-06-02 NOTE — Telephone Encounter (Signed)
Spoke with pt and went over recommendations per Dr. Tamala Julian.  Pt agreeable to plan.

## 2018-06-02 NOTE — Telephone Encounter (Signed)
  Patient has questions regarding results from yesterday

## 2018-06-02 NOTE — Telephone Encounter (Signed)
Left message to call back  

## 2018-06-02 NOTE — Telephone Encounter (Signed)
Returned pts call and he stated that he didn't have any questions re: his lab results.

## 2018-06-04 ENCOUNTER — Telehealth: Payer: Self-pay | Admitting: Interventional Cardiology

## 2018-06-04 NOTE — Telephone Encounter (Signed)
Pt states Dr. Tamala Julian left a message for him today and he was just calling back.  Advised I will send message to Dr. Tamala Julian to let him know.  Pt coming tomorrow for BMET.

## 2018-06-04 NOTE — Telephone Encounter (Signed)
Follow Up:      Returning your call from today. 

## 2018-06-05 ENCOUNTER — Encounter: Payer: Self-pay | Admitting: Interventional Cardiology

## 2018-06-05 ENCOUNTER — Other Ambulatory Visit: Payer: Medicare Other | Admitting: *Deleted

## 2018-06-05 ENCOUNTER — Ambulatory Visit (INDEPENDENT_AMBULATORY_CARE_PROVIDER_SITE_OTHER): Payer: Medicare Other | Admitting: Interventional Cardiology

## 2018-06-05 VITALS — BP 110/74 | HR 95 | Ht 67.0 in | Wt 179.8 lb

## 2018-06-05 DIAGNOSIS — E1159 Type 2 diabetes mellitus with other circulatory complications: Secondary | ICD-10-CM

## 2018-06-05 DIAGNOSIS — E7849 Other hyperlipidemia: Secondary | ICD-10-CM | POA: Diagnosis not present

## 2018-06-05 DIAGNOSIS — I25119 Atherosclerotic heart disease of native coronary artery with unspecified angina pectoris: Secondary | ICD-10-CM

## 2018-06-05 DIAGNOSIS — I5042 Chronic combined systolic (congestive) and diastolic (congestive) heart failure: Secondary | ICD-10-CM | POA: Diagnosis not present

## 2018-06-05 DIAGNOSIS — I6521 Occlusion and stenosis of right carotid artery: Secondary | ICD-10-CM

## 2018-06-05 DIAGNOSIS — N183 Chronic kidney disease, stage 3 unspecified: Secondary | ICD-10-CM

## 2018-06-05 DIAGNOSIS — Z79899 Other long term (current) drug therapy: Secondary | ICD-10-CM

## 2018-06-05 LAB — BASIC METABOLIC PANEL
BUN / CREAT RATIO: 12 (ref 10–24)
BUN: 17 mg/dL (ref 8–27)
CO2: 23 mmol/L (ref 20–29)
CREATININE: 1.44 mg/dL — AB (ref 0.76–1.27)
Calcium: 9.3 mg/dL (ref 8.6–10.2)
Chloride: 96 mmol/L (ref 96–106)
GFR calc non Af Amer: 50 mL/min/{1.73_m2} — ABNORMAL LOW (ref >59)
GFR, EST AFRICAN AMERICAN: 58 mL/min/{1.73_m2} — AB (ref >59)
Glucose: 194 mg/dL — ABNORMAL HIGH (ref 65–99)
Potassium: 4 mmol/L (ref 3.5–5.2)
SODIUM: 135 mmol/L (ref 134–144)

## 2018-06-05 NOTE — Progress Notes (Signed)
Cardiology Office Note:    Date:  06/05/2018   ID:  Marc [name], DOB Oct 04, 1950, MRN [id number]  PCP:  Marc Cruel, MD  Cardiologist:  Marc Grooms, MD   Referring MD: Marc Cruel, MD   Chief Complaint  Patient presents with  . Coronary Artery Disease    History of Present Illness:    Marc [name] is a 67 y.o. male with a hx of diabetes mellitus type 2, coronary artery disease with prior large anterior infarction, chronic combined systolic and diastolic heart failure, hypertension, hyperlipidemia, CKD stage III, and mild obesity.  Marc [name] was switched to [facility] from [facility] therapy about 6 weeks ago.  Over the past 2 weeks he began noticing lower extremity swelling and more recently has had 2 nights where he had a clear orthopnea.  Hearing this I started furosemide 20 mg/day.  At the same time that Delene Loll was started his primary care physician, unbeknownst to [place], discontinued hydrochlorothiazide therapy.  Therefore he is currently without diuretic therapy for approximately 6 weeks.  He made a trip to [place] increased and did a lot of restaurant jumping without being able to control his sodium.  This probably contributed to increased volume and symptoms.  After starting furosemide 20 mg/day, lower extremity edema and orthopnea have completely resolved.  Denies chest pain.  Over the past 2 weeks within this clinical volume overload, he had "indigestion".  This got better with time.  It is not exertional related but rather follows intake of greasy foods.  Past Medical History:  Diagnosis Date  . Cardiomyopathy ([place])    Left ventricle dysfunction with LVEF 35-40% by echo March 2009  . Chest pain   . Coronary atherosclerosis of native coronary artery    with large anterior myocardial infarction 1997. PTCA LAD. Distal circumflex and first diagonal DES 2011  . Diabetes ([place])   . Heart failure ([place])   . HTN (hypertension)   . Hypercholesteremia   .  Hypercholesteremia   . Hyperlipidemia   . Hyperlipidemia   . Psoriasis   . Sciatica     Past Surgical History:  Procedure Laterality Date  . CORONARY ANGIOPLASTY WITH STENT PLACEMENT     x 2 Dr. Tamala Julian    Current Medications: Current Meds  Medication Sig  . aspirin EC 81 MG tablet Take 81 mg by mouth daily.  Marland Kitchen atorvastatin (LIPITOR) 40 MG tablet Take 1 tablet (40 mg total) by mouth daily.  . calcium carbonate (TUMS - DOSED IN MG ELEMENTAL CALCIUM) 500 MG chewable tablet Chew 1 tablet by mouth as needed for heartburn.  . carvedilol (COREG) 25 MG tablet Take 1 tablet (25 mg total) by mouth 2 (two) times daily with a meal.  . clopidogrel (PLAVIX) 75 MG tablet TAKE 1 TABLET BY MOUTH EVERY DAY  . diclofenac sodium (VOLTAREN) 1 % GEL Apply 2 g topically 4 (four) times daily. PRN knee pain  . ezetimibe (ZETIA) 10 MG tablet TAKE 1 TABLET BY MOUTH EVERY DAY  . fish oil-omega-3 fatty acids 1000 MG capsule Take 3 g by mouth daily.  . fluocinonide gel (LIDEX) 2.54 % Apply 1 application topically 3 (three) times a week.   . furosemide (LASIX) 20 MG tablet Take 1 tablet (20 mg total) by mouth daily.  Marland Kitchen glimepiride (AMARYL) 4 MG tablet Take 4 mg by mouth daily.  . Lactase (LACTAID PO) Take 1 tablet by mouth as needed (for dairy intake).   . metFORMIN (GLUCOPHAGE) 500 MG  tablet Take 1,000 mg by mouth 2 (two) times daily with a meal.   . Multiple Vitamin (MULTIVITAMIN) capsule Take 1 capsule by mouth daily.  . nitroGLYCERIN (NITROSTAT) 0.4 MG SL tablet PLACE 1 TABLET UNDER THE TONGUE EVERY 5 MINUTES AS NEEDED FOR CHEST PAIN  . sacubitril-valsartan (ENTRESTO) 49-51 MG Take 1 tablet by mouth 2 (two) times daily.  Marland Kitchen selenium sulfide (SELSUN) 2.5 % shampoo Apply 1 application topically daily as needed for itching.      Allergies:   Patient has no known allergies.   Social History   Socioeconomic History  . Marital status: Married    Spouse name: Not on file  . Number of children: Not on file  .  Years of education: Not on file  . Highest education level: Not on file  Occupational History  . Not on file  Social Needs  . Financial resource strain: Not on file  . Food insecurity:    Worry: Not on file    Inability: Not on file  . Transportation needs:    Medical: Not on file    Non-medical: Not on file  Tobacco Use  . Smoking status: Never Smoker  . Smokeless tobacco: Never Used  Substance and Sexual Activity  . Alcohol use: Yes    Alcohol/week: 0.0 standard drinks    Comment: occasional  . Drug use: No  . Sexual activity: Not on file  Lifestyle  . Physical activity:    Days per week: Not on file    Minutes per session: Not on file  . Stress: Not on file  Relationships  . Social connections:    Talks on phone: Not on file    Gets together: Not on file    Attends religious service: Not on file    Active member of club or organization: Not on file    Attends meetings of clubs or organizations: Not on file    Relationship status: Not on file  Other Topics Concern  . Not on file  Social History Narrative  . Not on file     Family History: The patient's family history includes Arrhythmia in his mother; Diabetes in his mother; Healthy in his brother and sister; Heart failure in his mother; Hypertension in his mother; Prostate cancer in his brother and father; Stroke in his father; Thyroid disease in his brother.  ROS:   Please see the history of present illness.    No complaints at this time all other systems reviewed and are negative.  EKGs/Labs/Other Studies Reviewed:    The following studies were reviewed today: No new data  EKG:  EKG is  ordered today.    Recent Labs: 05/29/2018: BUN 24; Creatinine, Ser 1.52; Potassium 4.3; Sodium 138  Recent Lipid Panel    Component Value Date/Time   CHOL 71 (L) 06/26/2015 0856   TRIG 121 06/26/2015 0856   HDL 22 (L) 06/26/2015 0856   CHOLHDL 3.2 06/26/2015 0856   VLDL 24 06/26/2015 0856   LDLCALC 25 06/26/2015  0856    Physical Exam:    VS:  BP 110/74   Pulse 95   Ht 5\' 7"  (1.702 m)   Wt 179 lb 12.8 oz (81.6 kg)   BMI 28.16 kg/m     Wt Readings from Last 3 Encounters:  06/05/18 179 lb 12.8 oz (81.6 kg)  05/07/18 188 lb (85.3 kg)  09/22/17 190 lb 12.8 oz (86.5 kg)     GEN:  Well nourished, well developed in no  acute distress HEENT: Normal NECK: No JVD. LYMPHATICS: No lymphadenopathy CARDIAC: RRR, no murmur, no gallop, no edema. VASCULAR: 2+ bilateral radial and carotid regular pulses.  No bruits. RESPIRATORY:  Clear to auscultation without rales, wheezing or rhonchi  ABDOMEN: Soft, non-tender, non-distended, No pulsatile mass, MUSCULOSKELETAL: No deformity  SKIN: Warm and dry NEUROLOGIC:  Alert and oriented x 3 PSYCHIATRIC:  Normal affect   ASSESSMENT:    1. Chronic combined systolic and diastolic CHF, NYHA class 2 ([place])   2. Coronary artery disease involving native coronary artery of native heart with angina pectoris ([facility])   3. Other hyperlipidemia   4. Type 2 diabetes mellitus with other circulatory complication, without long-term current use of insulin ([place])   5. CKD (chronic kidney disease), stage III ([place])    PLAN:    In order of problems listed above:  1. Clinically compensated after reinstitution of a diuretic agent.  In August when he started to [facility], we were not aware that on the same day the primary physician, Dr. Harrington Challenger discontinued HCTZ because of relatively low blood pressure.  Over the next 4 to 6 weeks he developed lower extremity edema and eventually orthopnea.  Unaware that hydrochlorothiazide been discontinued, we started furosemide 20 mg/day with dramatic improvement within 24 to 36 hours.  Therefore, he does have clinical congestive heart failure if not on diuretic therapy.  For the time being continue furosemide 20 mg/day.  Lab work will be obtained today.  We will make a decision later about whether to go back to HCTZ or stay with furosemide. 2. I was  concerned that "indigestion" represent a myocardial ischemia misinterpreted as GI etiology by the patient.  After looking at the clinical scenario, including responsiveness to oral antacids, I do not believe further evaluation for ischemia is necessary. 3. LDL target less than 70 4. Hemoglobin A1c target less than 7 5. Basic metabolic panel was obtained today to follow-up kidney function.  Clinical follow-up in 3 to 4 months.  Earlier if dyspnea, chest discomfort, or other cardiovascular complaints.   Medication Adjustments/Labs and Tests Ordered: Current medicines are reviewed at length with the patient today.  Concerns regarding medicines are outlined above.  No orders of the defined types were placed in this encounter.  No orders of the defined types were placed in this encounter.   Patient Instructions  Medication Instructions:  Your physician recommends that you continue on your current medications as directed. Please refer to the Current Medication list given to you today.  If you need a refill on your cardiac medications before your next appointment, please call your pharmacy.   Lab work: None If you have labs (blood work) drawn today and your tests are completely normal, you will receive your results only by: Marland Kitchen MyChart Message (if you have MyChart) OR . A paper copy in the mail If you have any lab test that is abnormal or we need to change your treatment, we will call you to review the results.  Testing/Procedures: None  Follow-Up: At [facility], you and your health needs are our priority.  As part of our continuing mission to provide you with exceptional heart care, we have created designated Provider Care Teams.  These Care Teams include your primary Cardiologist (physician) and Advanced Practice Providers (APPs -  Physician Assistants and Nurse Practitioners) who all work together to provide you with the care you need, when you need it. You will need a follow up  appointment in 4 months.  Please  call our office 2 months in advance to schedule this appointment.  You may see Marc Grooms, MD or one of the following Advanced Practice Providers on your designated Care Team:   Truitt Merle, NP Cecilie Kicks, NP . Kathyrn Drown, NP  Any Other Special Instructions Will Be Listed Below (If Applicable).        Signed, Marc Grooms, MD  06/05/2018 12:29 PM    [place]

## 2018-06-05 NOTE — Patient Instructions (Signed)
Medication Instructions:  Your physician recommends that you continue on your current medications as directed. Please refer to the Current Medication list given to you today.  If you need a refill on your cardiac medications before your next appointment, please call your pharmacy.   Lab work: None If you have labs (blood work) drawn today and your tests are completely normal, you will receive your results only by: . MyChart Message (if you have MyChart) OR . A paper copy in the mail If you have any lab test that is abnormal or we need to change your treatment, we will call you to review the results.  Testing/Procedures: None  Follow-Up: At [facility], you and your health needs are our priority.  As part of our continuing mission to provide you with exceptional heart care, we have created designated Provider Care Teams.  These Care Teams include your primary Cardiologist (physician) and Advanced Practice Providers (APPs -  Physician Assistants and Nurse Practitioners) who all work together to provide you with the care you need, when you need it. You will need a follow up appointment in 4 months.  Please call our office 2 months in advance to schedule this appointment.  You may see Henry W Smith III, MD or one of the following Advanced Practice Providers on your designated Care Team:   Lori Gerhardt, NP Laura Ingold, NP . Jill McDaniel, NP  Any Other Special Instructions Will Be Listed Below (If Applicable).    

## 2018-06-08 ENCOUNTER — Telehealth: Payer: Self-pay | Admitting: *Deleted

## 2018-06-08 NOTE — Telephone Encounter (Signed)
-----   Message from Belva Crome, MD sent at 06/07/2018 10:00 PM EDT ----- Let the patient know labs are back to baseline. A copy will be sent to Lawerance Cruel, MD

## 2018-06-08 NOTE — Telephone Encounter (Signed)
Working in [employer]. Called pt re: lab results, left a message for pt to call back. jw 06/08/18

## 2018-06-12 ENCOUNTER — Telehealth: Payer: Self-pay | Admitting: Interventional Cardiology

## 2018-06-12 NOTE — Telephone Encounter (Signed)
Left message to call back  

## 2018-06-12 NOTE — Telephone Encounter (Signed)
New Message ° ° ° ° ° ° ° ° ° °Patient returned your call, would like a call back. °

## 2018-06-12 NOTE — Telephone Encounter (Signed)
Informed pt of results. Pt verbalized understanding. 

## 2018-06-15 ENCOUNTER — Other Ambulatory Visit: Payer: Medicare Other

## 2018-07-02 ENCOUNTER — Telehealth: Payer: Self-pay | Admitting: Interventional Cardiology

## 2018-07-02 DIAGNOSIS — I10 Essential (primary) hypertension: Secondary | ICD-10-CM

## 2018-07-02 NOTE — Telephone Encounter (Signed)
Pt c/o medication issue:  1. Name of Medication:   2. How are you currently taking this medication (dosage and times per day)? furosemide (LASIX) 20 MG tablet Take 1 tablet (20 mg total) by mouth daily   3. Are you having a reaction (difficulty breathing--STAT)? no  4. What is your medication issue?  He is calling stating that the dosage needs to be changed.  He states it's not strong enough it needs to be increased.

## 2018-07-02 NOTE — Telephone Encounter (Signed)
And Aldactone 25 mg daily.  Bmet in 1 week.

## 2018-07-02 NOTE — Telephone Encounter (Signed)
Per pt was told to call with update and feels like is SOB around 5:00 am Pt doesn't feel like this is strong enough dose of Furosemide .Will forward to Dr Tamala Julian for review .Adonis Housekeeper

## 2018-07-03 MED ORDER — SPIRONOLACTONE 25 MG PO TABS: 25.0000 mg | ORAL_TABLET | Freq: Every day | ORAL | 3 refills | Status: DC

## 2018-07-03 NOTE — Telephone Encounter (Signed)
Lm to call back ./cy 

## 2018-07-03 NOTE — Telephone Encounter (Signed)
PT AWARE OF NEW DIRECTIONS AND WILL HAVE REPEAT BMET NEXT Friday ./CY

## 2018-07-10 ENCOUNTER — Other Ambulatory Visit: Payer: PRIVATE HEALTH INSURANCE | Admitting: *Deleted

## 2018-07-10 DIAGNOSIS — I10 Essential (primary) hypertension: Secondary | ICD-10-CM

## 2018-07-11 LAB — BASIC METABOLIC PANEL
BUN / CREAT RATIO: 20 (ref 10–24)
BUN: 29 mg/dL — ABNORMAL HIGH (ref 8–27)
CO2: 22 mmol/L (ref 20–29)
CREATININE: 1.42 mg/dL — AB (ref 0.76–1.27)
Calcium: 10.2 mg/dL (ref 8.6–10.2)
Chloride: 106 mmol/L (ref 96–106)
GFR calc Af Amer: 59 mL/min/{1.73_m2} — ABNORMAL LOW (ref >59)
GFR, EST NON AFRICAN AMERICAN: 51 mL/min/{1.73_m2} — AB (ref >59)
Glucose: 116 mg/dL — ABNORMAL HIGH (ref 65–99)
POTASSIUM: 4.4 mmol/L (ref 3.5–5.2)
SODIUM: 145 mmol/L — AB (ref 134–144)

## 2018-08-11 ENCOUNTER — Other Ambulatory Visit: Payer: Self-pay | Admitting: Interventional Cardiology

## 2018-08-14 ENCOUNTER — Telehealth: Payer: Self-pay | Admitting: Interventional Cardiology

## 2018-08-14 MED ORDER — ATORVASTATIN CALCIUM 40 MG PO TABS: 40.0000 mg | ORAL_TABLET | Freq: Every day | ORAL | 2 refills | Status: DC

## 2018-08-14 NOTE — Telephone Encounter (Signed)
New Message    *STAT* If patient is at the pharmacy, call can be transferred to refill team.   1. Which medications need to be refilled? (please list name of each medication and dose if known) atorvastatin (LIPITOR) 40 MG tablet  2. Which pharmacy/location (including street and city if local pharmacy) is medication to be sent to? [id number] - [place], [place] - [address]  3. Do they need a 30 day or 90 day supply? [place] [employer]

## 2018-08-25 ENCOUNTER — Telehealth: Payer: Self-pay | Admitting: Interventional Cardiology

## 2018-08-25 NOTE — Telephone Encounter (Signed)
New message       Pt c/o medication issue:  1. Name of Medication: sacubitril-valsartan (ENTRESTO) 49-51 MG,   2. How are you currently taking this medication (dosage and times per day)? 1 tab twice a day    3. Are you having a reaction (difficulty breathing--STAT)?  No   4. What is your medication issue? Pt stated that gp doc put him on candesartan cilexetil 32mg    and he thinks that one of these medications are causing itching and tingling in hands and fingers toes  .

## 2018-08-25 NOTE — Telephone Encounter (Signed)
Pt started on Candesartan by PCP in December.  States he took it for about a week and developed tingling in toes and hands and had itching on the backs of his hands.  He decided to stop the Candesartan.  Symptoms have improved but not resolved completely.  Pt states he checks his BP twice daily at the [facility] (once before he swims and once after).  BP is usually around 89/65.  Advised pt to remain off of the Candesartan and scheduled him to see Dr. Tamala Julian on Friday.  Pt verbalized understanding and was in agreement with this plan.

## 2018-08-27 NOTE — Progress Notes (Signed)
Cardiology Office Note:    Date:  08/28/2018   ID:  Zacherie, Honeyman 09/16/1950, MRN [id number]  PCP:  Lawerance Cruel, MD  Cardiologist:  Sinclair Grooms, MD   Referring MD: Lawerance Cruel, MD   Chief Complaint  Patient presents with  . Coronary Artery Disease  . Congestive Heart Failure    History of Present Illness:    Marc [name] is a 68 y.o. male with a hx of diabetes mellitus type 2, coronary artery disease with prior large anterior infarction, chronic combined systolic and diastolic heart failure, hypertension, hyperlipidemia, CKD stage III, and mild obesity.  He is doing well.  Denies angina.  After adding Aldactone 25 mg/day orthopnea resolved.  There is no lower extremity swelling.  He has noted some positional dizziness.  He denies edema.  He denies cough.  Apparently had candesartan added to his medical regimen by primary care in December not realizing that [facility] contains an ACE inhibitor (Secubitril) which provides renal protection in the setting of diabetes.  ACE inhibitor/ARB therapy cannot be used in conjunction with Entresto.    Past Medical History:  Diagnosis Date  . Cardiomyopathy ([place])    Left ventricle dysfunction with LVEF 35-40% by echo March 2009  . Chest pain   . Coronary atherosclerosis of native coronary artery    with large anterior myocardial infarction 1997. PTCA LAD. Distal circumflex and first diagonal DES 2011  . Diabetes ([place])   . Heart failure ([place])   . HTN (hypertension)   . Hypercholesteremia   . Hypercholesteremia   . Hyperlipidemia   . Hyperlipidemia   . Psoriasis   . Sciatica     Past Surgical History:  Procedure Laterality Date  . CORONARY ANGIOPLASTY WITH STENT PLACEMENT     x 2 Dr. Tamala Julian    Current Medications: Current Meds  Medication Sig  . aspirin EC 81 MG tablet Take 81 mg by mouth daily.  Marland Kitchen atorvastatin (LIPITOR) 40 MG tablet Take 1 tablet (40 mg total) by mouth daily.  . calcium  carbonate (TUMS - DOSED IN MG ELEMENTAL CALCIUM) 500 MG chewable tablet Chew 1 tablet by mouth as needed for heartburn.  . carvedilol (COREG) 25 MG tablet Take 1 tablet (25 mg total) by mouth 2 (two) times daily with a meal.  . clopidogrel (PLAVIX) 75 MG tablet TAKE 1 TABLET BY MOUTH EVERY DAY  . diclofenac sodium (VOLTAREN) 1 % GEL Apply 2 g topically 4 (four) times daily. PRN knee pain  . ezetimibe (ZETIA) 10 MG tablet TAKE 1 TABLET BY MOUTH EVERY DAY  . fish oil-omega-3 fatty acids 1000 MG capsule Take 3 g by mouth daily.  . fluocinonide gel (LIDEX) 7.67 % Apply 1 application topically 3 (three) times a week.   . furosemide (LASIX) 20 MG tablet Take 1 tablet (20 mg total) by mouth daily.  Marland Kitchen glimepiride (AMARYL) 4 MG tablet Take 4 mg by mouth daily.  . Lactase (LACTAID PO) Take 1 tablet by mouth as needed (for dairy intake).   . metFORMIN (GLUCOPHAGE) 500 MG tablet Take 1,000 mg by mouth 2 (two) times daily with a meal.   . Multiple Vitamin (MULTIVITAMIN) capsule Take 1 capsule by mouth daily.  . nitroGLYCERIN (NITROSTAT) 0.4 MG SL tablet PLACE 1 TABLET UNDER THE TONGUE EVERY 5 MINUTES AS NEEDED FOR CHEST PAIN  . sacubitril-valsartan (ENTRESTO) 49-51 MG Take 1 tablet by mouth 2 (two) times daily.  Marland Kitchen selenium sulfide (SELSUN) 2.5 % shampoo  Apply 1 application topically daily as needed for itching.   . spironolactone (ALDACTONE) 25 MG tablet Take 1 tablet (25 mg total) by mouth daily.     Allergies:   Patient has no known allergies.   Social History   Socioeconomic History  . Marital status: Married    Spouse name: Not on file  . Number of children: Not on file  . Years of education: Not on file  . Highest education level: Not on file  Occupational History  . Not on file  Social Needs  . Financial resource strain: Not on file  . Food insecurity:    Worry: Not on file    Inability: Not on file  . Transportation needs:    Medical: Not on file    Non-medical: Not on file  Tobacco  Use  . Smoking status: Never Smoker  . Smokeless tobacco: Never Used  Substance and Sexual Activity  . Alcohol use: Yes    Alcohol/week: 0.0 standard drinks    Comment: occasional  . Drug use: No  . Sexual activity: Not on file  Lifestyle  . Physical activity:    Days per week: Not on file    Minutes per session: Not on file  . Stress: Not on file  Relationships  . Social connections:    Talks on phone: Not on file    Gets together: Not on file    Attends religious service: Not on file    Active member of club or organization: Not on file    Attends meetings of clubs or organizations: Not on file    Relationship status: Not on file  Other Topics Concern  . Not on file  Social History Narrative  . Not on file     Family History: The patient's family history includes Arrhythmia in his mother; Diabetes in his mother; Healthy in his brother and sister; Heart failure in his mother; Hypertension in his mother; Prostate cancer in his brother and father; Stroke in his father; Thyroid disease in his brother.  ROS:   Please see the history of present illness.    Back pain, muscle pain, dizziness.  All other systems reviewed and are negative.  EKGs/Labs/Other Studies Reviewed:    The following studies were reviewed today: No new functional or imaging data  EKG:  EKG is performed today and demonstrates normal sinus rhythm, left anterior hemiblock, first-degree AV block, and QRS duration of 130 ms.  Recent Labs: 07/10/2018: BUN 29; Creatinine, Ser 1.42; Potassium 4.4; Sodium 145  Recent Lipid Panel    Component Value Date/Time   CHOL 71 (L) 06/26/2015 0856   TRIG 121 06/26/2015 0856   HDL 22 (L) 06/26/2015 0856   CHOLHDL 3.2 06/26/2015 0856   VLDL 24 06/26/2015 0856   LDLCALC 25 06/26/2015 0856    Physical Exam:    VS:  BP 108/68   Pulse 82   Ht 5\' 7"  (1.702 m)   Wt 174 lb 1.9 oz (79 kg)   SpO2 98%   BMI 27.27 kg/m     Wt Readings from Last 3 Encounters:  08/28/18  174 lb 1.9 oz (79 kg)  06/05/18 179 lb 12.8 oz (81.6 kg)  05/07/18 188 lb (85.3 kg)     GEN:. No acute distress HEENT: Normal NECK: No JVD. LYMPHATICS: No lymphadenopathy CARDIAC: RRR.  No murmur, gallop, edema VASCULAR: Pulses are 2+ and symmetric radial and carotid locations., Bruits are absent in the carotid RESPIRATORY:  Clear to auscultation without  rales, wheezing or rhonchi  ABDOMEN: Soft, non-tender, non-distended, No pulsatile mass, MUSCULOSKELETAL: No deformity  SKIN: Warm and dry NEUROLOGIC:  Alert and oriented x 3 PSYCHIATRIC:  Normal affect   ASSESSMENT:    1. Chronic combined systolic and diastolic CHF, NYHA class 2 ([place])   2. Coronary artery disease involving native coronary artery of native heart with angina pectoris ([place])   3. Essential hypertension   4. CKD stage 3 secondary to diabetes [facility])    PLAN:    In order of problems listed above:  1. Stable without volume overload.  Cannot use ARB/ACE therapy in conjunction with Entresto.  Delene Loll should provide renal protection in diabetics as Sacubitril he has an ACE inhibitor.  I would like for primary to consider an SGLT2 as a component of diabetes therapy as this class of medication dramatically decreases the risk of recurrent episodes of heart failure and cardiovascular death.  (The best data is with dapagliflozin and canagliflozin). 2. Stable without angina/heartburn/dyspnea that would suggest ischemia. 3. Excellent blood pressure.  Depending upon laboratory data and current complaint of dizziness, may consider decreasing Aldactone to 12.5 mg/day. 4. Basic metabolic panel is obtained today.  In addition to targeting a hemoglobin A1c less than 7 by conventional means, additional therapies to decrease CV risk should be considered.  SGLT2 inhibitor therapy ("-flozin" e.g. canagliflozin, dapagliflozin, empagliflozin) decreases risk of heart failure development.  GLP-1 agonist therapy ("-glutide" agents e.g. Trulicity;  or "-atide" agents e.g.Byetta) to reduce vascular events.  Consider decreasing Aldactone to 12.5 mg/day based upon today's blood work.  Clinical follow-up in 2 months.   Signed, Sinclair Grooms, MD  08/28/2018 11:04 AM    [facility]    Medication Adjustments/Labs and Tests Ordered: Current medicines are reviewed at length with the patient today.  Concerns regarding medicines are outlined above.  Orders Placed This Encounter  Procedures  . Basic metabolic panel  . HgB A1c  . EKG 12-Lead   No orders of the defined types were placed in this encounter.   Patient Instructions  Medication Instructions:  Your physician recommends that you continue on your current medications as directed. Please refer to the Current Medication list given to you today.  If you need a refill on your cardiac medications before your next appointment, please call your pharmacy.   Lab work: BMET and A1C today If you have labs (blood work) drawn today and your tests are completely normal, you will receive your results only by: Marland Kitchen MyChart Message (if you have MyChart) OR . A paper copy in the mail If you have any lab test that is abnormal or we need to change your treatment, we will call you to review the results.  Testing/Procedures: None  Follow-Up:  Keep March appointment  Any Other Special Instructions Will Be Listed Below (If Applicable).       Signed, Sinclair Grooms, MD  08/28/2018 10:56 AM    [place]

## 2018-08-28 ENCOUNTER — Encounter: Payer: Self-pay | Admitting: Interventional Cardiology

## 2018-08-28 ENCOUNTER — Ambulatory Visit (INDEPENDENT_AMBULATORY_CARE_PROVIDER_SITE_OTHER): Payer: Medicare Other | Admitting: Interventional Cardiology

## 2018-08-28 VITALS — BP 108/68 | HR 82 | Ht 67.0 in | Wt 174.1 lb

## 2018-08-28 DIAGNOSIS — I10 Essential (primary) hypertension: Secondary | ICD-10-CM | POA: Diagnosis not present

## 2018-08-28 DIAGNOSIS — I25119 Atherosclerotic heart disease of native coronary artery with unspecified angina pectoris: Secondary | ICD-10-CM | POA: Diagnosis not present

## 2018-08-28 DIAGNOSIS — I5042 Chronic combined systolic (congestive) and diastolic (congestive) heart failure: Secondary | ICD-10-CM | POA: Diagnosis not present

## 2018-08-28 DIAGNOSIS — E1122 Type 2 diabetes mellitus with diabetic chronic kidney disease: Secondary | ICD-10-CM

## 2018-08-28 DIAGNOSIS — N183 Chronic kidney disease, stage 3 (moderate): Secondary | ICD-10-CM | POA: Diagnosis not present

## 2018-08-28 NOTE — Patient Instructions (Signed)
Medication Instructions:  Your physician recommends that you continue on your current medications as directed. Please refer to the Current Medication list given to you today.  If you need a refill on your cardiac medications before your next appointment, please call your pharmacy.   Lab work: BMET and A1C today If you have labs (blood work) drawn today and your tests are completely normal, you will receive your results only by: Marland Kitchen MyChart Message (if you have MyChart) OR . A paper copy in the mail If you have any lab test that is abnormal or we need to change your treatment, we will call you to review the results.  Testing/Procedures: None  Follow-Up:  Keep March appointment  Any Other Special Instructions Will Be Listed Below (If Applicable).

## 2018-08-29 LAB — BASIC METABOLIC PANEL
BUN/Creatinine Ratio: 22 (ref 10–24)
BUN: 33 mg/dL — ABNORMAL HIGH (ref 8–27)
CALCIUM: 9.9 mg/dL (ref 8.6–10.2)
CO2: 19 mmol/L — AB (ref 20–29)
CREATININE: 1.5 mg/dL — AB (ref 0.76–1.27)
Chloride: 103 mmol/L (ref 96–106)
GFR calc Af Amer: 55 mL/min/{1.73_m2} — ABNORMAL LOW (ref >59)
GFR, EST NON AFRICAN AMERICAN: 47 mL/min/{1.73_m2} — AB (ref >59)
GLUCOSE: 153 mg/dL — AB (ref 65–99)
POTASSIUM: 4.6 mmol/L (ref 3.5–5.2)
Sodium: 137 mmol/L (ref 134–144)

## 2018-08-29 LAB — HEMOGLOBIN A1C
ESTIMATED AVERAGE GLUCOSE: 140 mg/dL
HEMOGLOBIN A1C: 6.5 % — AB (ref 4.8–5.6)

## 2018-08-31 ENCOUNTER — Telehealth: Payer: Self-pay | Admitting: *Deleted

## 2018-08-31 MED ORDER — SPIRONOLACTONE 25 MG PO TABS: 12.5000 mg | ORAL_TABLET | Freq: Every day | ORAL | 3 refills | Status: DC

## 2018-08-31 NOTE — Telephone Encounter (Signed)
Spoke with pt and went over lab results and recommendations.  Pt verbalized understanding and was in agreement with this plan.  Pt appreciative for call.

## 2018-08-31 NOTE — Telephone Encounter (Signed)
-----   Message from Belva Crome, MD sent at 08/29/2018 10:50 AM EST ----- Let the patient know the labs are very stable. Decrease aldactone to 12.5 mg daily. A copy will be sent to Lawerance Cruel, MD

## 2018-10-06 DIAGNOSIS — H16223 Keratoconjunctivitis sicca, not specified as Sjogren's, bilateral: Secondary | ICD-10-CM | POA: Diagnosis not present

## 2018-10-06 DIAGNOSIS — H524 Presbyopia: Secondary | ICD-10-CM | POA: Diagnosis not present

## 2018-10-06 DIAGNOSIS — H43393 Other vitreous opacities, bilateral: Secondary | ICD-10-CM | POA: Diagnosis not present

## 2018-10-06 DIAGNOSIS — H2513 Age-related nuclear cataract, bilateral: Secondary | ICD-10-CM | POA: Diagnosis not present

## 2018-10-06 DIAGNOSIS — H52203 Unspecified astigmatism, bilateral: Secondary | ICD-10-CM | POA: Diagnosis not present

## 2018-10-06 DIAGNOSIS — E119 Type 2 diabetes mellitus without complications: Secondary | ICD-10-CM | POA: Diagnosis not present

## 2018-10-06 DIAGNOSIS — H04123 Dry eye syndrome of bilateral lacrimal glands: Secondary | ICD-10-CM | POA: Diagnosis not present

## 2018-10-06 DIAGNOSIS — H5203 Hypermetropia, bilateral: Secondary | ICD-10-CM | POA: Diagnosis not present

## 2018-10-06 DIAGNOSIS — Z7984 Long term (current) use of oral hypoglycemic drugs: Secondary | ICD-10-CM | POA: Diagnosis not present

## 2018-10-19 ENCOUNTER — Other Ambulatory Visit: Payer: Self-pay | Admitting: Interventional Cardiology

## 2018-10-27 DIAGNOSIS — Z79899 Other long term (current) drug therapy: Secondary | ICD-10-CM | POA: Diagnosis not present

## 2018-10-27 DIAGNOSIS — Z125 Encounter for screening for malignant neoplasm of prostate: Secondary | ICD-10-CM | POA: Diagnosis not present

## 2018-10-27 DIAGNOSIS — E1169 Type 2 diabetes mellitus with other specified complication: Secondary | ICD-10-CM | POA: Diagnosis not present

## 2018-10-27 DIAGNOSIS — Z0000 Encounter for general adult medical examination without abnormal findings: Secondary | ICD-10-CM | POA: Diagnosis not present

## 2018-10-27 DIAGNOSIS — K219 Gastro-esophageal reflux disease without esophagitis: Secondary | ICD-10-CM | POA: Diagnosis not present

## 2018-11-02 ENCOUNTER — Telehealth: Payer: Self-pay | Admitting: Interventional Cardiology

## 2018-11-02 NOTE — Telephone Encounter (Signed)
Coronavirus disease 19 precaution.  Please reschedule for 6 to 8 weeks.  Patient in agreement and is doing well.

## 2018-11-02 NOTE — Telephone Encounter (Signed)
Spoke with pt and rescheduled him to see Dr. Tamala Julian on 12/29/2018.

## 2018-11-03 ENCOUNTER — Ambulatory Visit: Payer: Medicare Other | Admitting: Interventional Cardiology

## 2018-12-15 ENCOUNTER — Telehealth: Payer: Self-pay | Admitting: Physician Assistant

## 2018-12-15 NOTE — Telephone Encounter (Signed)
Spoke with patient who confirmed all demographics. I sent a link to patient to sign up for My Chart. He has a Stage manager.  He will have vitals ready for visit.    Virtual Visit Pre-Appointment Phone Call  "(Name), I am calling you today to discuss your upcoming appointment. We are currently trying to limit exposure to the virus that causes COVID-19 by seeing patients at home rather than in the office."  1. "What is the BEST phone number to call the day of the visit?" - include this in appointment notes  2. Do you have or have access to (through a family member/friend) a smartphone with video capability that we can use for your visit?" a. If yes - list this number in appt notes as cell (if different from BEST phone #) and list the appointment type as a VIDEO visit in appointment notes b. If no - list the appointment type as a PHONE visit in appointment notes  Confirm consent - "In the setting of the current Covid19 crisis, you are scheduled for a (phone or video) visit with your provider on (date) at (time).  Just as we do with many in-office visits, in order for you to participate in this visit, we must obtain consent.  If you'd like, I can send this to your mychart (if signed up) or email for you to review.  Otherwise, I can obtain your verbal consent now.  All virtual visits are billed to your insurance company just like a normal visit would be.  By agreeing to a virtual visit, we'd like you to understand that the technology does not allow for your provider to perform an examination, and thus may limit your provider's ability to fully assess your condition. If your provider identifies any concerns that need to be evaluated in person, we will make arrangements to do so.  Finally, though the technology is pretty good, we cannot assure that it will always work on either your or our end, and in the setting of a video visit, we may have to convert it to a phone-only visit.  In either situation, we  cannot ensure that we have a secure connection.  Are you willing to proceed?"  Patient said  "yes".  3. Advise patient to be prepared - "Two hours prior to your appointment, go ahead and check your blood pressure, pulse, oxygen saturation, and your weight (if you have the equipment to check those) and write them all down. When your visit starts, your provider will ask you for this information. If you have an Apple Watch or Kardia device, please plan to have heart rate information ready on the day of your appointment. Please have a pen and paper handy nearby the day of the visit as well."  4. Give patient instructions for MyChart download to smartphone OR Doximity/Doxy.me as below if video visit (depending on what platform provider is using)  5. Inform patient they will receive a phone call 15 minutes prior to their appointment time (may be from unknown caller ID) so they should be prepared to answer    TELEPHONE CALL NOTE  KYNDALL CHAPLIN has been deemed a candidate for a follow-up tele-health visit to limit community exposure during the Covid-19 pandemic. I spoke with the patient via phone to ensure availability of phone/video source, confirm preferred email & phone number, and discuss instructions and expectations.  I reminded BIRDIE BEVERIDGE to be prepared with any vital sign and/or heart rhythm information that could potentially  be obtained via home monitoring, at the time of his visit. I reminded TREASURE INGRUM to expect a phone call prior to his visit.  Ardelle Anton 12/15/2018 4:08 PM   INSTRUCTIONS FOR DOWNLOADING THE MYCHART APP TO SMARTPHONE  - The patient must first make sure to have activated MyChart and know their login information - If Apple, go to [employer] and type in MyChart in the search bar and download the app. If Android, ask patient to go to [employer] and type in [place] in the search bar and download the app. The app is free but as with any other  app downloads, their phone may require them to verify saved payment information or Apple/Android password.  - The patient will need to then log into the app with their MyChart username and password, and select [facility] as their healthcare provider to link the account. When it is time for your visit, go to the MyChart app, find appointments, and click Begin Video Visit. Be sure to Select Allow for your device to access the Microphone and Camera for your visit. You will then be connected, and your provider will be with you shortly.  **If they have any issues connecting, or need assistance please contact MyChart service desk [phone])**  **If using a computer, in order to ensure the best quality for their visit they will need to use either of the following Internet Browsers: [employer], or Google Chrome**  IF USING DOXIMITY or DOXY.ME - The patient will receive a link just prior to their visit by text.     FULL LENGTH CONSENT FOR TELE-HEALTH VISIT   I hereby voluntarily request, consent and authorize [facility] and its employed or contracted physicians, physician assistants, nurse practitioners or other licensed health care professionals (the Practitioner), to provide me with telemedicine health care services (the Services") as deemed necessary by the treating Practitioner. I acknowledge and consent to receive the Services by the Practitioner via telemedicine. I understand that the telemedicine visit will involve communicating with the Practitioner through live audiovisual communication technology and the disclosure of certain medical information by electronic transmission. I acknowledge that I have been given the opportunity to request an in-person assessment or other available alternative prior to the telemedicine visit and am voluntarily participating in the telemedicine visit.  I understand that I have the right to withhold or withdraw my consent to the use of  telemedicine in the course of my care at any time, without affecting my right to future care or treatment, and that the Practitioner or I may terminate the telemedicine visit at any time. I understand that I have the right to inspect all information obtained and/or recorded in the course of the telemedicine visit and may receive copies of available information for a reasonable fee.  I understand that some of the potential risks of receiving the Services via telemedicine include:   Delay or interruption in medical evaluation due to technological equipment failure or disruption;  Information transmitted may not be sufficient (e.g. poor resolution of images) to allow for appropriate medical decision making by the Practitioner; and/or   In rare instances, security protocols could fail, causing a breach of personal health information.  Furthermore, I acknowledge that it is my responsibility to provide information about my medical history, conditions and care that is complete and accurate to the best of my ability. I acknowledge that Practitioner's advice, recommendations, and/or decision may be based on factors not within their control,  such as incomplete or inaccurate data provided by me or distortions of diagnostic images or specimens that may result from electronic transmissions. I understand that the practice of medicine is not an exact science and that Practitioner makes no warranties or guarantees regarding treatment outcomes. I acknowledge that I will receive a copy of this consent concurrently upon execution via email to the email address I last provided but may also request a printed copy by calling the office of [facility].    I understand that my insurance will be billed for this visit.   I have read or had this consent read to me.  I understand the contents of this consent, which adequately explains the benefits and risks of the Services being provided via telemedicine.   I have been  provided ample opportunity to ask questions regarding this consent and the Services and have had my questions answered to my satisfaction.  I give my informed consent for the services to be provided through the use of telemedicine in my medical care  By participating in this telemedicine visit I agree to the above.

## 2018-12-29 ENCOUNTER — Encounter: Payer: Self-pay | Admitting: Physician Assistant

## 2018-12-29 ENCOUNTER — Telehealth (INDEPENDENT_AMBULATORY_CARE_PROVIDER_SITE_OTHER): Payer: Medicare Other | Admitting: Physician Assistant

## 2018-12-29 ENCOUNTER — Telehealth: Payer: Medicare Other | Admitting: Physician Assistant

## 2018-12-29 ENCOUNTER — Other Ambulatory Visit: Payer: Self-pay

## 2018-12-29 VITALS — BP 98/62 | HR 82 | Ht 67.0 in | Wt 175.0 lb

## 2018-12-29 DIAGNOSIS — N183 Chronic kidney disease, stage 3 unspecified: Secondary | ICD-10-CM

## 2018-12-29 DIAGNOSIS — I5042 Chronic combined systolic (congestive) and diastolic (congestive) heart failure: Secondary | ICD-10-CM

## 2018-12-29 DIAGNOSIS — I25110 Atherosclerotic heart disease of native coronary artery with unstable angina pectoris: Secondary | ICD-10-CM

## 2018-12-29 DIAGNOSIS — E7849 Other hyperlipidemia: Secondary | ICD-10-CM

## 2018-12-29 DIAGNOSIS — I10 Essential (primary) hypertension: Secondary | ICD-10-CM

## 2018-12-29 NOTE — Progress Notes (Signed)
Spoke with pt and scheduled him to see Dr. Tamala Julian in Sept.

## 2018-12-29 NOTE — Patient Instructions (Addendum)
Medication Instructions:  Your physician recommends that you continue on your current medications as directed. Please refer to the Current Medication list given to you today.  If you need a refill on your cardiac medications before your next appointment, please call your pharmacy.   Lab work: None ordered  If you have labs (blood work) drawn today and your tests are completely normal, you will receive your results only by: Marland Kitchen MyChart Message (if you have MyChart) OR . A paper copy in the mail If you have any lab test that is abnormal or we need to change your treatment, we will call you to review the results.  Testing/Procedures: None ordered   Follow-Up: At [facility], you and your health needs are our priority.  As part of our continuing mission to provide you with exceptional heart care, we have created designated Provider Care Teams.  These Care Teams include your primary Cardiologist (physician) and Advanced Practice Providers (APPs -  Physician Assistants and Nurse Practitioners) who all work together to provide you with the care you need, when you need it. You will need a follow up appointment in 4 months.  Please call our office 2 months in advance to schedule this appointment.  You may see Sinclair Grooms, MD or one of the following Advanced Practice Providers on your designated Care Team:   Truitt Merle, NP Cecilie Kicks, NP . Kathyrn Drown, NP  Any Other Special Instructions Will Be Listed Below (If Applicable).

## 2018-12-29 NOTE — Progress Notes (Signed)
He needs to follow up with me on return.

## 2018-12-29 NOTE — Progress Notes (Signed)
Virtual Visit via Video Note   This visit type was conducted due to national recommendations for restrictions regarding the COVID-19 Pandemic (e.g. social distancing) in an effort to limit this patient's exposure and mitigate transmission in our community.  Due to his co-morbid illnesses, this patient is at least at moderate risk for complications without adequate follow up.  This format is felt to be most appropriate for this patient at this time.  All issues noted in this document were discussed and addressed.  A limited physical exam was performed with this format.  Please refer to the patient's chart for his consent to telehealth for [facility].   Date:  12/29/2018   ID:  Marc [name], DOB [birth year], MRN [id number]  Patient Location: Home Provider Location: Home  PCP:  Lawerance Cruel, MD  Cardiologist:  Sinclair Grooms, MD Electrophysiologist:  None   Evaluation Performed:  Follow-Up Visit  Chief Complaint:  3 months follow up  History of Present Illness:    Marc [name] is a 68 y.o. male with hx of CAD, chronic combined CHF, HTN, carotid artery disease, CKD stage III and DM seen for follow up.  Last echocardiogram August 2019 showed slightly lower LV function at 35-40% compared to MRI in 2016 (46%).  Stoped her Avapro and started on Entresto and spironolactone.   Patient reports taking sublingual nitroglycerin prior to playing badminton.  However due to COVID-19 pandemic he is unable to play.  He is active at home doing push mowing lawn.  He takes nitroglycerin prior to cutting grass each time.  He never tried without taking nitroglycerin.  He denied exertional chest pain or shortness of breath.  Patient reports feeling better since he has started taking Entresto.  He denies orthopnea, PND, syncope, lower extremity edema or melena.  The patient does not have symptoms concerning for COVID-19 infection (fever, chills, cough, or new shortness of breath).     Past Medical History:  Diagnosis Date  . Cardiomyopathy ([place])    Left ventricle dysfunction with LVEF 35-40% by echo March 2009  . Chest pain   . Coronary atherosclerosis of native coronary artery    with large anterior myocardial infarction 1997. PTCA LAD. Distal circumflex and first diagonal DES 2011  . Diabetes ([place])   . Heart failure ([place])   . HTN (hypertension)   . Hypercholesteremia   . Hypercholesteremia   . Hyperlipidemia   . Hyperlipidemia   . Psoriasis   . Sciatica    Past Surgical History:  Procedure Laterality Date  . CORONARY ANGIOPLASTY WITH STENT PLACEMENT     x 2 Dr. Tamala Julian     Current Meds  Medication Sig  . aspirin EC 81 MG tablet Take 81 mg by mouth daily.  Marland Kitchen atorvastatin (LIPITOR) 40 MG tablet Take 1 tablet (40 mg total) by mouth daily.  . calcium carbonate (TUMS - DOSED IN MG ELEMENTAL CALCIUM) 500 MG chewable tablet Chew 1 tablet by mouth as needed for heartburn.  . carvedilol (COREG) 25 MG tablet Take 1 tablet (25 mg total) by mouth 2 (two) times daily with a meal.  . clopidogrel (PLAVIX) 75 MG tablet TAKE 1 TABLET BY MOUTH EVERY DAY  . diclofenac sodium (VOLTAREN) 1 % GEL Apply 2 g topically 4 (four) times daily. PRN knee pain  . ezetimibe (ZETIA) 10 MG tablet TAKE 1 TABLET BY MOUTH EVERY DAY  . famotidine (PEPCID) 40 MG tablet Take 40 mg by mouth daily.  . fish  oil-omega-3 fatty acids 1000 MG capsule Take 3 g by mouth daily.  . fluocinonide gel (LIDEX) 6.26 % Apply 1 application topically 3 (three) times a week.   . furosemide (LASIX) 20 MG tablet Take 1 tablet (20 mg total) by mouth daily.  Marland Kitchen glimepiride (AMARYL) 4 MG tablet Take 4 mg by mouth daily.  . metFORMIN (GLUCOPHAGE) 500 MG tablet Take 1,000 mg by mouth 2 (two) times daily with a meal.   . Multiple Vitamin (MULTIVITAMIN) capsule Take 1 capsule by mouth daily.  . nitroGLYCERIN (NITROSTAT) 0.4 MG SL tablet PLACE 1 TABLET UNDER THE TONGUE EVERY 5 MINUTES AS NEEDED FOR CHEST PAIN  .  sacubitril-valsartan (ENTRESTO) 49-51 MG Take 1 tablet by mouth 2 (two) times daily.  Marland Kitchen selenium sulfide (SELSUN) 2.5 % shampoo Apply 1 application topically daily as needed for itching.   . [DISCONTINUED] Lactase (LACTAID PO) Take 1 tablet by mouth as needed (for dairy intake).   . [DISCONTINUED] spironolactone (ALDACTONE) 25 MG tablet Take 0.5 tablets (12.5 mg total) by mouth daily.     Allergies:   Patient has no known allergies.   Social History   Tobacco Use  . Smoking status: Never Smoker  . Smokeless tobacco: Never Used  Substance Use Topics  . Alcohol use: Yes    Alcohol/week: 0.0 standard drinks    Comment: occasional  . Drug use: No     Family Hx: The patient's family history includes Arrhythmia in his mother; Diabetes in his mother; Healthy in his brother and sister; Heart failure in his mother; Hypertension in his mother; Prostate cancer in his brother and father; Stroke in his father; Thyroid disease in his brother.  ROS:   Please see the history of present illness.    All other systems reviewed and are negative.   Prior CV studies:   The following studies were reviewed today:  As above   Carotid doppler 03/2018 Final Interpretation: Right Carotid: Velocities in the right ICA are consistent with a 40-59%                stenosis.  Left Carotid: Velocities in the left ICA are consistent with a 1-39% stenosis.  Vertebrals:  Bilateral vertebral arteries demonstrate antegrade flow. Subclavians: Normal flow hemodynamics were seen in bilateral subclavian              arteries.   Labs/Other Tests and Data Reviewed:    EKG:  No ECG reviewed.  Recent Labs: 08/28/2018: BUN 33; Creatinine, Ser 1.50; Potassium 4.6; Sodium 137   Recent Lipid Panel Lab Results  Component Value Date/Time   CHOL 71 (L) 06/26/2015 08:56 AM   TRIG 121 06/26/2015 08:56 AM   HDL 22 (L) 06/26/2015 08:56 AM   CHOLHDL 3.2 06/26/2015 08:56 AM   LDLCALC 25 06/26/2015 08:56 AM    Wt  Readings from Last 3 Encounters:  12/29/18 175 lb (79.4 kg)  08/28/18 174 lb 1.9 oz (79 kg)  06/05/18 179 lb 12.8 oz (81.6 kg)     Objective:    Vital Signs:  BP 98/62   Pulse 82   Ht 5\' 7"  (1.702 m)   Wt 175 lb (79.4 kg)   BMI 27.41 kg/m    VITAL SIGNS:  reviewed GEN:  no acute distress EYES:  sclerae anicteric, EOMI - Extraocular Movements Intact RESPIRATORY:  normal respiratory effort, symmetric expansion CARDIOVASCULAR:  no peripheral edema SKIN:  no rash, lesions or ulcers. MUSCULOSKELETAL:  no obvious deformities. NEURO:  alert and oriented  x 3, no obvious focal deficit PSYCH:  normal affect  ASSESSMENT & PLAN:    1. Chronic combined CHF - LVEF of 35-40% (slightly lower form prior 46% by MRI in 2016) by echo 03/2018. Tolerating medical therapy of Coreg 25mg  BID, Entresto 49/51 and Spironolactone 12.5mg  daily.   2. CAD -Patient takes sublingual nitroglycerin before exertional activity chronically.  Currently takes before push mowing lawn.  He never tried push moving without taking nitroglycerin.  Discussed possibility of intolerance in the future.  Patient agreed and he will cut the grass next time without taking nitroglycerin and let us know response.  He may need functional study in the future versus daily Imdur. -Continue dual antiplatelet therapy.  DAPT and statin.  3. Carotid artery disease - Last study 03/2018 showed 09-32% RICA and 3-55% LICA.  4. HTN -Soft low.  Asymptomatic.  Continue current therapy.  5. HLD - Continue Zetia and Lipitor.    COVID-19 Education: The signs and symptoms of COVID-19 were discussed with the patient and how to seek care for testing (follow up with PCP or arrange E-visit). The importance of social distancing was discussed today.  Time:   Today, I have spent 12 minutes with the patient with telehealth technology discussing the above problems.     Medication Adjustments/Labs and Tests Ordered: Current medicines are reviewed at  length with the patient today.  Concerns regarding medicines are outlined above.   Tests Ordered: No orders of the defined types were placed in this encounter.   Medication Changes: No orders of the defined types were placed in this encounter.   Disposition:  Follow up in 4 month(s)  Signed, Leanor Kail, PA  12/29/2018 2:26 PM    [facility]

## 2019-01-12 ENCOUNTER — Other Ambulatory Visit: Payer: Self-pay | Admitting: Interventional Cardiology

## 2019-03-29 ENCOUNTER — Ambulatory Visit (HOSPITAL_COMMUNITY)
Admission: RE | Admit: 2019-03-29 | Discharge: 2019-03-29 | Disposition: A | Discharge status: Home / Self Care | Payer: Medicare Other | Source: Ambulatory Visit | Attending: Cardiology | Admitting: Cardiology

## 2019-03-29 ENCOUNTER — Other Ambulatory Visit: Payer: Self-pay

## 2019-03-29 DIAGNOSIS — I6521 Occlusion and stenosis of right carotid artery: Secondary | ICD-10-CM

## 2019-04-01 ENCOUNTER — Other Ambulatory Visit: Payer: Self-pay | Admitting: Interventional Cardiology

## 2019-04-28 ENCOUNTER — Ambulatory Visit (INDEPENDENT_AMBULATORY_CARE_PROVIDER_SITE_OTHER): Payer: Medicare Other | Admitting: Interventional Cardiology

## 2019-04-28 ENCOUNTER — Encounter: Payer: Self-pay | Admitting: Interventional Cardiology

## 2019-04-28 ENCOUNTER — Other Ambulatory Visit: Payer: Self-pay

## 2019-04-28 VITALS — BP 112/64 | HR 81 | Ht 67.0 in | Wt 175.1 lb

## 2019-04-28 DIAGNOSIS — E1159 Type 2 diabetes mellitus with other circulatory complications: Secondary | ICD-10-CM

## 2019-04-28 DIAGNOSIS — N183 Chronic kidney disease, stage 3 unspecified: Secondary | ICD-10-CM

## 2019-04-28 DIAGNOSIS — Z7189 Other specified counseling: Secondary | ICD-10-CM

## 2019-04-28 DIAGNOSIS — E1122 Type 2 diabetes mellitus with diabetic chronic kidney disease: Secondary | ICD-10-CM

## 2019-04-28 DIAGNOSIS — I10 Essential (primary) hypertension: Secondary | ICD-10-CM | POA: Diagnosis not present

## 2019-04-28 DIAGNOSIS — I25119 Atherosclerotic heart disease of native coronary artery with unspecified angina pectoris: Secondary | ICD-10-CM

## 2019-04-28 DIAGNOSIS — I5042 Chronic combined systolic (congestive) and diastolic (congestive) heart failure: Secondary | ICD-10-CM | POA: Diagnosis not present

## 2019-04-28 DIAGNOSIS — I6521 Occlusion and stenosis of right carotid artery: Secondary | ICD-10-CM

## 2019-04-28 NOTE — Progress Notes (Signed)
Cardiology Office Note:    Date:  04/28/2019   ID:  Marc [name], DOB 04-08-1951, MRN [id number]  PCP:  Lawerance Cruel, MD  Cardiologist:  Sinclair Grooms, MD   Referring MD: Lawerance Cruel, MD   Chief Complaint  Patient presents with  . Coronary Artery Disease  . Congestive Heart Failure    History of Present Illness:    Marc [name] is a 68 y.o. male with a hx of diabetes mellitus type 2, coronary artery disease with prior large anterior infarction, chronic combined systolic and diastolic heart failure, hypertension, hyperlipidemia, CKD stage III, and mild obesity.  Marc [name] is doing well.  He denies angina and dyspnea.  No lower extremity edema.  [facility]  He is not having orthopnea or PND.  No palpitations.  No episodes of syncope.  He still mows his own grass.  Past Medical History:  Diagnosis Date  . Cardiomyopathy ([place])    Left ventricle dysfunction with LVEF 35-40% by echo March 2009  . Chest pain   . Coronary atherosclerosis of native coronary artery    with large anterior myocardial infarction 1997. PTCA LAD. Distal circumflex and first diagonal DES 2011  . Diabetes ([place])   . Heart failure ([place])   . HTN (hypertension)   . Hypercholesteremia   . Hypercholesteremia   . Hyperlipidemia   . Hyperlipidemia   . Psoriasis   . Sciatica     Past Surgical History:  Procedure Laterality Date  . CORONARY ANGIOPLASTY WITH STENT PLACEMENT     x 2 Dr. Tamala Julian    Current Medications: Current Meds  Medication Sig  . aspirin EC 81 MG tablet Take 81 mg by mouth daily.  Marland Kitchen atorvastatin (LIPITOR) 40 MG tablet Take 1 tablet (40 mg total) by mouth daily.  . calcium carbonate (TUMS - DOSED IN MG ELEMENTAL CALCIUM) 500 MG chewable tablet Chew 1 tablet by mouth as needed for heartburn.  . carvedilol (COREG) 25 MG tablet Take 1 tablet (25 mg total) by mouth 2 (two) times daily with a meal.  . clopidogrel (PLAVIX) 75 MG  tablet TAKE 1 TABLET BY MOUTH EVERY DAY  . diclofenac sodium (VOLTAREN) 1 % GEL Apply 2 g topically 4 (four) times daily. PRN knee pain  . ezetimibe (ZETIA) 10 MG tablet TAKE 1 TABLET BY MOUTH EVERY DAY  . famotidine (PEPCID) 40 MG tablet Take 40 mg by mouth daily.  . fish oil-omega-3 fatty acids 1000 MG capsule Take 3 g by mouth daily.  . fluocinonide gel (LIDEX) [id number] % Apply 1 application topically 3 (three) times a week.   . furosemide (LASIX) 20 MG tablet Take 1 tablet (20 mg total) by mouth daily.  Marland Kitchen glimepiride (AMARYL) 4 MG tablet Take 4 mg by mouth daily.  . metFORMIN (GLUCOPHAGE) 500 MG tablet Take 1,000 mg by mouth 2 (two) times daily with a meal.   . Multiple Vitamin (MULTIVITAMIN) capsule Take 1 capsule by mouth daily.  . nitroGLYCERIN (NITROSTAT) 0.4 MG SL tablet PLACE 1 TABLET UNDER THE TONGUE EVERY 5 MINUTES AS NEEDED FOR CHEST PAIN  . sacubitril-valsartan (ENTRESTO) 49-51 MG Take 1 tablet by mouth 2 (two) times daily.  Marland Kitchen selenium sulfide (SELSUN) 2.5 % shampoo Apply 1 application topically daily as needed for itching.      Allergies:   Patient has no known allergies.   Social History   Socioeconomic History  . Marital status: Married  Spouse name: Not on file  . Number of children: Not on file  . Years of education: Not on file  . Highest education level: Not on file  Occupational History  . Not on file  Social Needs  . Financial resource strain: Not on file  . Food insecurity    Worry: Not on file    Inability: Not on file  . Transportation needs    Medical: Not on file    Non-medical: Not on file  Tobacco Use  . Smoking status: Never Smoker  . Smokeless tobacco: Never Used  Substance and Sexual Activity  . Alcohol use: Yes    Alcohol/week: 0.0 standard drinks    Comment: occasional  . Drug use: No  . Sexual activity: Not on file  Lifestyle  . Physical activity    Days per week: Not on file    Minutes per session: Not on file  . Stress: Not on file   Relationships  . Social Herbalist on phone: Not on file    Gets together: Not on file    Attends religious service: Not on file    Active member of club or organization: Not on file    Attends meetings of clubs or organizations: Not on file    Relationship status: Not on file  Other Topics Concern  . Not on file  Social History Narrative  . Not on file     Family History: The patient's family history includes Arrhythmia in his mother; Diabetes in his mother; Healthy in his brother and sister; Heart failure in his mother; Hypertension in his mother; Prostate cancer in his brother and father; Stroke in his father; Thyroid disease in his brother.  ROS:   Please see the history of present illness.    Fungus on the toes of his left foot.  He fatigues quicker now than previous.  All other systems reviewed and are negative.  EKGs/Labs/Other Studies Reviewed:    The following studies were reviewed today: Carotid vascular ultrasound 03/29/2019: Summary: Right Carotid: Velocities in the right ICA are now consistent with a 1-39%                stenosis. The RICA velocities have decreased compared to the                prior exam and are now within normal range compared to the prior                exam.  Left Carotid: Velocities in the left ICA are consistent with a 1-39% stenosis.               The LICA velocities remain within normal range and stable compared               to the prior exam.  Vertebrals:  Bilateral vertebral arteries demonstrate antegrade flow. Small              caliber right vertebral artery. Subclavians: Normal flow hemodynamics were seen in bilateral subclavian              arteries.  *See table(s) above for measurements and observations.     EKG:  EKG not repeated  Recent Labs: 08/28/2018: BUN 33; Creatinine, Ser 1.50; Potassium 4.6; Sodium 137  Recent Lipid Panel    Component Value Date/Time   CHOL 71 (L) 06/26/2015 0856   TRIG 121  06/26/2015 0856   HDL 22 (L) 06/26/2015 [id number]  CHOLHDL 3.2 06/26/2015 0856   VLDL 24 06/26/2015 0856   LDLCALC 25 06/26/2015 0856    Physical Exam:    VS:  BP 112/64   Pulse 81   Ht 5\' 7"  (1.702 m)   Wt 175 lb 1.9 oz (79.4 kg)   SpO2 99%   BMI 27.43 kg/m     Wt Readings from Last 3 Encounters:  04/28/19 175 lb 1.9 oz (79.4 kg)  12/29/18 175 lb (79.4 kg)  08/28/18 174 lb 1.9 oz (79 kg)     GEN: Weight is stable.  Mild to moderate obesity.. No acute distress HEENT: Normal NECK: No JVD. LYMPHATICS: No lymphadenopathy CARDIAC:  RRR without murmur, gallop, or edema. VASCULAR:  Normal Pulses. No bruits. RESPIRATORY:  Clear to auscultation without rales, wheezing or rhonchi  ABDOMEN: Soft, non-tender, non-distended, No pulsatile mass, MUSCULOSKELETAL: No deformity  SKIN: Warm and dry NEUROLOGIC:  Alert and oriented x 3 PSYCHIATRIC:  Normal affect   ASSESSMENT:    1. Coronary artery disease involving native coronary artery of native heart with angina pectoris ([place])   2. Type 2 diabetes mellitus with other circulatory complication, without long-term current use of insulin ([place])   3. CKD stage 3 secondary to diabetes ([place])   4. Essential hypertension   5. Chronic combined systolic and diastolic CHF, NYHA class 2 ([facility])   6. Stenosis of right carotid artery   7. Educated About Covid-19 Virus Infection    PLAN:    In order of problems listed above:  1. Stable.  Secondary prevention for both carotid and coronary disease as discussed. 2. Most recent hemoglobin A1c was 6.5.  He developed a yeast infection on SGLT2 therapy.  Therefore, Vania Rea was discontinued. 3. Most recent creatinine was 1.5. 4. Excellent blood pressure control.  Target 115/70. 5. No evidence of volume overload.  Good heart failure therapy currently on Entresto and carvedilol. 6. Stable without neurological symptoms. 7. Handwashing, face masking, and social distancing encouraged to avoid COVID-19.   Overall education and awareness concerning primary/secondary risk prevention was discussed in detail: LDL less than 70, hemoglobin A1c less than 7, blood pressure target less than 130/80 mmHg, >150 minutes of moderate aerobic activity per week, avoidance of smoking, weight control (via diet and exercise), and continued surveillance/management of/for obstructive sleep apnea.    Medication Adjustments/Labs and Tests Ordered: Current medicines are reviewed at length with the patient today.  Concerns regarding medicines are outlined above.  No orders of the defined types were placed in this encounter.  No orders of the defined types were placed in this encounter.   There are no Patient Instructions on file for this visit.   Signed, Sinclair Grooms, MD  04/28/2019 2:51 PM    [facility]

## 2019-04-28 NOTE — Patient Instructions (Signed)

## 2019-05-06 ENCOUNTER — Other Ambulatory Visit: Payer: Self-pay | Admitting: Interventional Cardiology

## 2019-05-14 DIAGNOSIS — I2510 Atherosclerotic heart disease of native coronary artery without angina pectoris: Secondary | ICD-10-CM | POA: Diagnosis not present

## 2019-05-14 DIAGNOSIS — E1169 Type 2 diabetes mellitus with other specified complication: Secondary | ICD-10-CM | POA: Diagnosis not present

## 2019-05-14 DIAGNOSIS — I10 Essential (primary) hypertension: Secondary | ICD-10-CM | POA: Diagnosis not present

## 2019-05-14 DIAGNOSIS — E119 Type 2 diabetes mellitus without complications: Secondary | ICD-10-CM | POA: Diagnosis not present

## 2019-05-14 DIAGNOSIS — E785 Hyperlipidemia, unspecified: Secondary | ICD-10-CM | POA: Diagnosis not present

## 2019-05-19 DIAGNOSIS — Z23 Encounter for immunization: Secondary | ICD-10-CM | POA: Diagnosis not present

## 2019-06-21 ENCOUNTER — Other Ambulatory Visit: Payer: Self-pay | Admitting: Interventional Cardiology

## 2019-07-02 ENCOUNTER — Other Ambulatory Visit: Payer: Self-pay | Admitting: Interventional Cardiology

## 2019-07-09 DIAGNOSIS — I10 Essential (primary) hypertension: Secondary | ICD-10-CM | POA: Diagnosis not present

## 2019-07-09 DIAGNOSIS — I2510 Atherosclerotic heart disease of native coronary artery without angina pectoris: Secondary | ICD-10-CM | POA: Diagnosis not present

## 2019-07-09 DIAGNOSIS — E785 Hyperlipidemia, unspecified: Secondary | ICD-10-CM | POA: Diagnosis not present

## 2019-07-09 DIAGNOSIS — E1169 Type 2 diabetes mellitus with other specified complication: Secondary | ICD-10-CM | POA: Diagnosis not present

## 2019-07-09 DIAGNOSIS — E119 Type 2 diabetes mellitus without complications: Secondary | ICD-10-CM | POA: Diagnosis not present

## 2019-08-09 ENCOUNTER — Other Ambulatory Visit: Payer: Self-pay | Admitting: Interventional Cardiology

## 2019-09-08 ENCOUNTER — Telehealth: Payer: Self-pay | Admitting: Interventional Cardiology

## 2019-09-08 NOTE — Telephone Encounter (Signed)
Marc [name]- Can we do a PA on this?

## 2019-09-08 NOTE — Telephone Encounter (Signed)
Pt c/o medication issue:  1. Name of Medication: ENTRESTO 49-51 MG  2. How are you currently taking this medication (dosage and times per day)? Twice daily as directed  3. Are you having a reaction (difficulty breathing--STAT)? no  4. What is your medication issue? Pt is having insurance coverage issues.  The patient has [employer]. [employer] company will need a statement from Dr. Tamala Julian so that the company will continue to cover it.

## 2019-09-09 NOTE — Telephone Encounter (Signed)
**Note De-Identified Marc [name] Obfuscation** I called [facility], the pt pharmacy and was advised that the pt picked up his Entresto on 08/25/2019 and that he paid a co-pay of $0 and that a PA is not needed at this time.  I called the pt and left a message on his VM asking him to call me back so we can discuss what his [place] is asking for concerning his Entresto.

## 2019-09-09 NOTE — Telephone Encounter (Signed)
The pt has not called me back but I did start an [place] PA through covermymeds in case a PA is needed at his next refill.

## 2019-09-10 NOTE — Telephone Encounter (Signed)
**Note De-Identified Harjit Douds Obfuscation** Message received from covermymeds: Katy Apo Key: [facility] - PA Case ID: [id number]  Outcome Approved on January 21  PA Case: [id number] Coverage Starts on: 09/09/2019  Coverage Ends on: 09/08/2020.  DrugEntresto 49-51MG  tablets  [employer] Medicare 4-Part Emergency planning/management officer PA Form

## 2019-09-10 NOTE — Telephone Encounter (Signed)
**Note De-Identified Marc [name] Obfuscation** I called the pt but got no ans so I left a detailed message on his VM stating that I did do a Entresto PA on his behalf and that it has been approved for 1 year. I advised that if he feels that his ins provider is requesting something else to call us back. I did leave the office phone number and my name in the message so he can call me back if he has questions or concerns.

## 2019-09-10 NOTE — Telephone Encounter (Signed)
Follow Up ° °Patient is returning call. Please give patient a call back.  °

## 2019-09-13 NOTE — Telephone Encounter (Signed)
**Note De-identified Nichola Cieslinski Obfuscation** LMTCB

## 2019-09-24 NOTE — Telephone Encounter (Signed)
Left message for pt to call back and let us know if he was able to get his Entresto or not.

## 2019-11-11 NOTE — Progress Notes (Signed)
Cardiology Office Note:    Date:  11/12/2019   ID:  Rayshon, Albaugh 1951-02-21, MRN [id number]  PCP:  Lawerance Cruel, MD  Cardiologist:  Sinclair Grooms, MD   Referring MD: Lawerance Cruel, MD   Chief Complaint  Patient presents with  . Coronary Artery Disease  . Congestive Heart Failure    History of Present Illness:    PEARSON PICOU is a 69 y.o. male with a hx of diabetes mellitus type 2, coronary artery disease with prior large anterior infarction, chronic combined systolic and diastolic heart failure, hypertension, hyperlipidemia, CKD stage III, and mild obesity.  Mr. Watchman is doing well.  He has not had angina.  He has not been able to play racquetball because of the pandemic.  He has been swimming of the [employer].  Swimming does not elicit any heart failure or anginal complaints.  He has not had palpitations, syncope, lower extremity edema, or other symptoms.  His knees limit physical activity.  They are source of some level of chronic discomfort.  He still remains quite active.  He sleeps well.  Past Medical History:  Diagnosis Date  . Cardiomyopathy ([place])    Left ventricle dysfunction with LVEF 35-40% by echo March 2009  . Chest pain   . Coronary atherosclerosis of native coronary artery    with large anterior myocardial infarction 1997. PTCA LAD. Distal circumflex and first diagonal DES 2011  . Diabetes ([place])   . Heart failure ([place])   . HTN (hypertension)   . Hypercholesteremia   . Hypercholesteremia   . Hyperlipidemia   . Hyperlipidemia   . Psoriasis   . Sciatica     Past Surgical History:  Procedure Laterality Date  . CORONARY ANGIOPLASTY WITH STENT PLACEMENT     x 2 Dr. Tamala Julian    Current Medications: Current Meds  Medication Sig  . aspirin EC 81 MG tablet Take 81 mg by mouth daily.  Marland Kitchen atorvastatin (LIPITOR) 40 MG tablet TAKE 1 TABLET BY MOUTH EVERY DAY  . betamethasone dipropionate 0.05 % cream Apply topically See  admin instructions.  . calcium carbonate (TUMS - DOSED IN MG ELEMENTAL CALCIUM) 500 MG chewable tablet Chew 1 tablet by mouth as needed for heartburn.  . carvedilol (COREG) 25 MG tablet Take 1 tablet (25 mg total) by mouth 2 (two) times daily with a meal.  . clopidogrel (PLAVIX) 75 MG tablet TAKE 1 TABLET BY MOUTH EVERY DAY  . diclofenac sodium (VOLTAREN) 1 % GEL Apply 2 g topically 4 (four) times daily. PRN knee pain  . ENTRESTO 49-51 MG TAKE 1 TABLET BY MOUTH TWICE A DAY  . ezetimibe (ZETIA) 10 MG tablet TAKE 1 TABLET BY MOUTH EVERY DAY  . famotidine (PEPCID) 40 MG tablet Take 40 mg by mouth daily.  . fish oil-omega-3 fatty acids 1000 MG capsule Take 3 g by mouth daily.  . furosemide (LASIX) 20 MG tablet TAKE 1 TABLET BY MOUTH EVERY DAY  . glimepiride (AMARYL) 4 MG tablet Take 4 mg by mouth daily.  . metFORMIN (GLUCOPHAGE) 500 MG tablet Take 1,000 mg by mouth 2 (two) times daily with a meal.   . Multiple Vitamin (MULTIVITAMIN) capsule Take 1 capsule by mouth daily.  . nitroGLYCERIN (NITROSTAT) 0.4 MG SL tablet PLACE 1 TABLET UNDER THE TONGUE EVERY 5 MINUTES AS NEEDED FOR CHEST PAIN  . selenium sulfide (SELSUN) 2.5 % shampoo Apply 1 application topically daily as needed for itching.  Allergies:   Patient has no known allergies.   Social History   Socioeconomic History  . Marital status: Married    Spouse name: Not on file  . Number of children: Not on file  . Years of education: Not on file  . Highest education level: Not on file  Occupational History  . Not on file  Tobacco Use  . Smoking status: Never Smoker  . Smokeless tobacco: Never Used  Substance and Sexual Activity  . Alcohol use: Yes    Alcohol/week: 0.0 standard drinks    Comment: occasional  . Drug use: No  . Sexual activity: Not on file  Other Topics Concern  . Not on file  Social History Narrative  . Not on file   Social Determinants of Health   Financial Resource Strain:   . Difficulty of Paying Living  Expenses:   Food Insecurity:   . Worried About Charity fundraiser in the Last Year:   . Arboriculturist in the Last Year:   Transportation Needs:   . Film/video editor (Medical):   Marland Kitchen Lack of Transportation (Non-Medical):   Physical Activity:   . Days of Exercise per Week:   . Minutes of Exercise per Session:   Stress:   . Feeling of Stress :   Social Connections:   . Frequency of Communication with Friends and Family:   . Frequency of Social Gatherings with Friends and Family:   . Attends Religious Services:   . Active Member of Clubs or Organizations:   . Attends Archivist Meetings:   Marland Kitchen Marital Status:      Family History: The patient's family history includes Arrhythmia in his mother; Diabetes in his mother; Healthy in his brother and sister; Heart failure in his mother; Hypertension in his mother; Prostate cancer in his brother and father; Stroke in his father; Thyroid disease in his brother.  ROS:   Please see the history of present illness.    Arthritis in both knees.  Some stress within the household related to interactions with his wife.  He is sleeping well.  He is swimming 2 days a week.  He is now getting into yard work and will get even more exercise.  No chest discomfort and no orthopnea.  All other systems reviewed and are negative.  EKGs/Labs/Other Studies Reviewed:    The following studies were reviewed today: No new functional data  EKG:  EKG normal sinus rhythm, poor R wave progression, left axis deviation, left anterior hemiblock.  When compared to the prior tracing from January 2020, no significant change has occurred.  Anteroseptal infarction pattern is constant.  Recent Labs: No results found for requested labs within last 8760 hours.  Recent Lipid Panel    Component Value Date/Time   CHOL 71 (L) 06/26/2015 0856   TRIG 121 06/26/2015 0856   HDL 22 (L) 06/26/2015 0856   CHOLHDL 3.2 06/26/2015 0856   VLDL 24 06/26/2015 0856   LDLCALC 25  06/26/2015 0856    Physical Exam:    VS:  BP 106/68   Pulse 76   Ht 5\' 7"  (1.702 m)   Wt 171 lb 12.8 oz (77.9 kg)   SpO2 96%   BMI 26.91 kg/m     Wt Readings from Last 3 Encounters:  11/12/19 171 lb 12.8 oz (77.9 kg)  04/28/19 175 lb 1.9 oz (79.4 kg)  12/29/18 175 lb (79.4 kg)     GEN: Weight is down 4 pounds  and he appears healthy. No acute distress HEENT: Normal NECK: No JVD. LYMPHATICS: No lymphadenopathy CARDIAC: An S4 gallop is audible.  RRR without murmur, S3 gallop, or edema. VASCULAR:  Normal Pulses. No bruits. RESPIRATORY:  Clear to auscultation without rales, wheezing or rhonchi  ABDOMEN: Soft, non-tender, non-distended, No pulsatile mass, MUSCULOSKELETAL: No deformity  SKIN: Warm and dry NEUROLOGIC:  Alert and oriented x 3 PSYCHIATRIC:  Normal affect   ASSESSMENT:    1. Coronary artery disease involving native coronary artery of native heart with angina pectoris ([place])   2. Type 2 diabetes mellitus with other circulatory complication, without long-term current use of insulin ([place])   3. CKD stage 3 secondary to diabetes ([place])   4. Essential hypertension   5. Chronic combined systolic and diastolic CHF, NYHA class 2 ([place])   6. Educated about COVID-19 virus infection    PLAN:    In order of problems listed above:  1. Secondary prevention discussed.  He is sitting all targets.  No recent laboratory data.  A lipid panel will be done today. 2. Hemoglobin A1c was 6.5 when last evaluated.  This was over a year ago.  An A1c will be done today. 3. Comprehensive metabolic panel will be done today to reassess kidney function.  He will also have a hemoglobin A1c, lipid panel, and CBC. 4. Excellent blood pressure control.  Continue Entresto, carvedilol, furosemide combination.  Spironolactone caused gynecomastia and nipple tenderness. 5. Continue guideline directed therapy for heart failure as noted below.  Tried spironolactone but developed nipple discomfort. 6. Has  received the COVID-19 vaccine.  Still practicing social distancing.   Overall education and awareness concerning primary/secondary risk prevention was discussed in detail: LDL less than 70, hemoglobin A1c less than 7, blood pressure target less than 130/80 mmHg, >150 minutes of moderate aerobic activity per week, avoidance of smoking, weight control (via diet and exercise), and continued surveillance/management of/for obstructive sleep apnea.  Guideline directed therapy for left ventricular systolic dysfunction: Angiotensin receptor-neprilysin inhibitor (ARNI)-Entresto; beta-blocker therapy - carvedilol or metoprolol succinate; mineralocorticoid receptor antagonist (MRA) therapy -spironolactone or eplerenone.  These therapies have been shown to improve clinical outcomes including reduction of rehospitalization survival, and acute heart failure. We tried [place] but he developed yeast infection.  Medication Adjustments/Labs and Tests Ordered: Current medicines are reviewed at length with the patient today.  Concerns regarding medicines are outlined above.  Orders Placed This Encounter  Procedures  . Basic metabolic panel  . Hepatic function panel  . HgB A1c  . CBC  . Lipid panel  . EKG 12-Lead   No orders of the defined types were placed in this encounter.   Patient Instructions  Medication Instructions:  Your physician recommends that you continue on your current medications as directed. Please refer to the Current Medication list given to you today.  *If you need a refill on your cardiac medications before your next appointment, please call your pharmacy*   Lab Work: BMET, Liver, Lipid, CBC and A1C today  If you have labs (blood work) drawn today and your tests are completely normal, you will receive your results only by: Marland Kitchen MyChart Message (if you have MyChart) OR . A paper copy in the mail If you have any lab test that is abnormal or we need to change your treatment, we will  call you to review the results.   Testing/Procedures: None   Follow-Up: At [facility], you and your health needs are our priority.  As part of our  continuing mission to provide you with exceptional heart care, we have created designated Provider Care Teams.  These Care Teams include your primary Cardiologist (physician) and Advanced Practice Providers (APPs -  Physician Assistants and Nurse Practitioners) who all work together to provide you with the care you need, when you need it.  We recommend signing up for the patient portal called "MyChart".  Sign up information is provided on this After Visit Summary.  MyChart is used to connect with patients for Virtual Visits (Telemedicine).  Patients are able to view lab/test results, encounter notes, upcoming appointments, etc.  Non-urgent messages can be sent to your provider as well.   To learn more about what you can do with MyChart, go to [email or web address].    Your next appointment:   6 month(s)  The format for your next appointment:   In Person  Provider:   You may see Sinclair Grooms, MD or one of the following Advanced Practice Providers on your designated Care Team:    Truitt Merle, NP  Cecilie Kicks, NP  Kathyrn Drown, NP    Other Instructions      Signed, Sinclair Grooms, MD  11/12/2019 12:35 PM    [place]

## 2019-11-12 ENCOUNTER — Ambulatory Visit: Payer: HMO | Admitting: Interventional Cardiology

## 2019-11-12 ENCOUNTER — Other Ambulatory Visit: Payer: Self-pay

## 2019-11-12 ENCOUNTER — Encounter: Payer: Self-pay | Admitting: Interventional Cardiology

## 2019-11-12 VITALS — BP 106/68 | HR 76 | Ht 67.0 in | Wt 171.8 lb

## 2019-11-12 DIAGNOSIS — I10 Essential (primary) hypertension: Secondary | ICD-10-CM | POA: Diagnosis not present

## 2019-11-12 DIAGNOSIS — Z7189 Other specified counseling: Secondary | ICD-10-CM

## 2019-11-12 DIAGNOSIS — E1159 Type 2 diabetes mellitus with other circulatory complications: Secondary | ICD-10-CM | POA: Diagnosis not present

## 2019-11-12 DIAGNOSIS — N183 Chronic kidney disease, stage 3 unspecified: Secondary | ICD-10-CM

## 2019-11-12 DIAGNOSIS — E1122 Type 2 diabetes mellitus with diabetic chronic kidney disease: Secondary | ICD-10-CM

## 2019-11-12 DIAGNOSIS — I5042 Chronic combined systolic (congestive) and diastolic (congestive) heart failure: Secondary | ICD-10-CM

## 2019-11-12 DIAGNOSIS — I25119 Atherosclerotic heart disease of native coronary artery with unspecified angina pectoris: Secondary | ICD-10-CM

## 2019-11-12 NOTE — Patient Instructions (Signed)
Medication Instructions:  Your physician recommends that you continue on your current medications as directed. Please refer to the Current Medication list given to you today.  *If you need a refill on your cardiac medications before your next appointment, please call your pharmacy*   Lab Work: BMET, Liver, Lipid, CBC and A1C today  If you have labs (blood work) drawn today and your tests are completely normal, you will receive your results only by: Marland Kitchen MyChart Message (if you have MyChart) OR . A paper copy in the mail If you have any lab test that is abnormal or we need to change your treatment, we will call you to review the results.   Testing/Procedures: None   Follow-Up: At [facility], you and your health needs are our priority.  As part of our continuing mission to provide you with exceptional heart care, we have created designated Provider Care Teams.  These Care Teams include your primary Cardiologist (physician) and Advanced Practice Providers (APPs -  Physician Assistants and Nurse Practitioners) who all work together to provide you with the care you need, when you need it.  We recommend signing up for the patient portal called "MyChart".  Sign up information is provided on this After Visit Summary.  MyChart is used to connect with patients for Virtual Visits (Telemedicine).  Patients are able to view lab/test results, encounter notes, upcoming appointments, etc.  Non-urgent messages can be sent to your provider as well.   To learn more about what you can do with MyChart, go to [email or web address].    Your next appointment:   6 month(s)  The format for your next appointment:   In Person  Provider:   You may see Sinclair Grooms, MD or one of the following Advanced Practice Providers on your designated Care Team:    Truitt Merle, NP  Cecilie Kicks, NP  Kathyrn Drown, NP    Other Instructions

## 2019-11-15 LAB — LIPID PANEL
Chol/HDL Ratio: 2.7 ratio (ref 0.0–5.0)
Cholesterol, Total: 71 mg/dL — ABNORMAL LOW (ref 100–199)
HDL: 26 mg/dL — ABNORMAL LOW (ref >39)
LDL Chol Calc (NIH): 30 mg/dL (ref 0–99)
Triglycerides: 64 mg/dL (ref 0–149)
VLDL Cholesterol Cal: 15 mg/dL (ref 5–40)

## 2019-11-15 LAB — BASIC METABOLIC PANEL
BUN/Creatinine Ratio: 14 (ref 10–24)
BUN: 20 mg/dL (ref 8–27)
CO2: 24 mmol/L (ref 20–29)
Calcium: 10 mg/dL (ref 8.6–10.2)
Chloride: 102 mmol/L (ref 96–106)
Creatinine, Ser: 1.48 mg/dL — ABNORMAL HIGH (ref 0.76–1.27)
GFR calc Af Amer: 55 mL/min/{1.73_m2} — ABNORMAL LOW (ref >59)
GFR calc non Af Amer: 48 mL/min/{1.73_m2} — ABNORMAL LOW (ref >59)
Glucose: 140 mg/dL — ABNORMAL HIGH (ref 65–99)
Potassium: 4.6 mmol/L (ref 3.5–5.2)
Sodium: 141 mmol/L (ref 134–144)

## 2019-11-15 LAB — HEMOGLOBIN A1C
Est. average glucose Bld gHb Est-mCnc: 128 mg/dL
Hgb A1c MFr Bld: 6.1 % — ABNORMAL HIGH (ref 4.8–5.6)

## 2019-11-15 LAB — CBC
Hematocrit: 37.5 % (ref 37.5–51.0)
Hemoglobin: 11.5 g/dL — ABNORMAL LOW (ref 13.0–17.7)
MCH: 25.3 pg — ABNORMAL LOW (ref 26.6–33.0)
MCHC: 30.7 g/dL — ABNORMAL LOW (ref 31.5–35.7)
MCV: 83 fL (ref 79–97)
Platelets: 142 10*3/uL — ABNORMAL LOW (ref 150–450)
RBC: 4.54 x10E6/uL (ref 4.14–5.80)
RDW: 13.9 % (ref 11.6–15.4)
WBC: 5.1 10*3/uL (ref 3.4–10.8)

## 2019-11-15 LAB — HEPATIC FUNCTION PANEL
ALT: 11 IU/L (ref 0–44)
AST: 20 IU/L (ref 0–40)
Albumin: 4.4 g/dL (ref 3.8–4.8)
Alkaline Phosphatase: 49 IU/L (ref 39–117)
Bilirubin Total: 0.5 mg/dL (ref 0.0–1.2)
Bilirubin, Direct: 0.17 mg/dL (ref 0.00–0.40)
Total Protein: 7.2 g/dL (ref 6.0–8.5)

## 2019-11-16 ENCOUNTER — Other Ambulatory Visit: Payer: Self-pay

## 2019-11-16 ENCOUNTER — Telehealth: Payer: Self-pay | Admitting: *Deleted

## 2019-11-16 ENCOUNTER — Other Ambulatory Visit: Payer: Self-pay | Admitting: *Deleted

## 2019-11-16 ENCOUNTER — Other Ambulatory Visit: Payer: HMO

## 2019-11-16 DIAGNOSIS — D649 Anemia, unspecified: Secondary | ICD-10-CM

## 2019-11-16 NOTE — Addendum Note (Signed)
Addended by: Loren Racer on: 11/16/2019 09:32 AM   Modules accepted: Orders

## 2019-11-16 NOTE — Patient Outreach (Addendum)
  [facility]) Care Management Chronic Special Needs Program    11/16/2019  Name: Marc [name], DOB: [birth year]  MRN: [id number]   [name] is enrolled in a chronic special needs plan for Diabetes.  A completed health risk assessment has been received from the client.   The client's individualized care plan was developed based on available data.  Plan:   Send unsuccessful outreach letter with a copy of individualized care plan to client  Send educational material:     Emmi - "Low blood sugar in people with diabetes", "Heart attack",  "Checking your blood pressure at home", "Why taking your medicine or drug as ordered is important"   Send individualized care plan to provider   Chronic care management coordinator, Jacqlyn Larsen [phone]), will attempt outreach in 2-4 months.   Jerilynn Birkenhead, RN, MSN, CCM, [place] Management Clinical Nurse Specialist Office Number [phone]

## 2019-11-16 NOTE — Telephone Encounter (Signed)
-----   Message from Belva Crome, MD sent at 11/13/2019  2:05 PM EDT ----- Let the patient know the labs are okay but he is developing anemia that needs to be evaluated. Needs Ferritin and Iron/TIBC. When was last colonoscopy? F/U with the PCP. Also, stop aspirin. A copy will be sent to Lawerance Cruel, MD

## 2019-11-16 NOTE — Telephone Encounter (Signed)
Spoke with pt and reviewed recommendations. Scheduled pt for labs today. Will route these labs to PCP as well as the ones from today once they are back. Pt appreciative for call. Pt states "it has been awhile" since his last colonoscopy.

## 2019-11-17 LAB — IRON AND TIBC
Iron Saturation: 17 % (ref 15–55)
Iron: 52 ug/dL (ref 38–169)
Total Iron Binding Capacity: 300 ug/dL (ref 250–450)
UIBC: 248 ug/dL (ref 111–343)

## 2019-11-17 LAB — FERRITIN: Ferritin: 35 ng/mL (ref 30–400)

## 2019-11-18 ENCOUNTER — Other Ambulatory Visit: Payer: Self-pay | Admitting: *Deleted

## 2019-12-02 ENCOUNTER — Other Ambulatory Visit: Payer: Self-pay | Admitting: *Deleted

## 2019-12-02 ENCOUNTER — Encounter: Payer: Self-pay | Admitting: *Deleted

## 2019-12-02 NOTE — Patient Outreach (Signed)
[facility] [facility]) Care Management Chronic Special Needs Program  12/02/2019  Name: Marc [name] DOB: 12/26/1950  MRN: [id number]  [name] is enrolled in a chronic special needs plan for Diabetes. Chronic Care Management Coordinator telephoned client to review health risk assessment and to develop individualized care plan.  Introduced the chronic care management program, importance of client participation, and taking their care plan to all provider appointments and inpatient facilities.  Reviewed the transition of care process and possible referral to community care management.  Subjective: Client states he lives with spouse and is independent in all aspects of his care, client swims 3 times per week at [facility] [place] twice weekly with a push mower, reports eating healthy and gave examples to RN care Freight forwarder of what he eats, credits spouse with cooking healthy meals. Client checks CBG 2 x week with fasting ranges 110-119, AIC is 6.1.  Client attends all doctor's appointments, weighs every other day, does not record citing weights are always consistent and has had no issues with heart failure and will keep on this schedule.  Client reports he takes all medications as prescribed.  Client states he does not think he received HTA calendar in the mail and is not aware of HTA conciegre number.    Goals Addressed            This Visit's Progress   . "to stay active" (pt-stated)   On track    Continue to do your swimming, yard work and other activities Continue to choose healthy food choices being mindful of carbohydrate intake Get adequate sleep Please call HTA conciegre for any benefit questions at [phone] Please use the 24 hour nurse advice line for any health related questions at [phone]    . Client understands the importance of follow-up with providers by attending scheduled visits   On track    Review of medical record indicates member completed 3  visits with primary provider in 2020 and one visit in 2021. 8 visits with cardiologist in 2020 and one visit in 2021, 1 visit with ophthalmology in 2020. It is important to continue to keep all follow up appointments with your providers. Schedule your annual wellness visit with your primary care provider each year.       . Client verbalize knowledge of Heart Failure disease self management skills by no hospitalization within next 12 months related to heart failure   On track    Please use HTA calendar to remind you of the heart failure action plan, record daily weights and write down questions that you want to ask your provider.  Take this tool with you to all follow up appointments with your provider.   Please review your Heart Failure action plan/ zones in the back of [employer] calendar. Please weigh daily and record weights. Call your health care provider for worsening symptoms such as 3 pound weight gain over night or 5 pounds in a week, increased shortness or breath or swelling in your legs and feet. Please call 911 for severe symptoms such as unrelieved shortness of breath and chest pain. Adhere to a low salt diet.      . Client verbalizes knowledge of Heart Attack self management skills by ability to repeat signs and symptoms of heart attack.   On track    Please call 911 for any symptoms of a heart attack.  Symptoms may include chest pain, pressure or tightness, pain that radiates to your jaw, neck or  back, shortness of breath, nausea, indigestion, lightheadedness or sudden dizziness. Follow a heart healthy diet (rich in fruits, vegetables, lean proteins and low in salt).  Continue exercising/ swimming Take medications as prescribed. Follow up with your health care provider as recommended.      . Client will report no worsening of symptoms of Atrial Fibrillation within the next 6 months   On track    Please review A-fib Action Plan in the back of [employer]  calendar. Please report to your health care provider any worsening of symptoms such as racing or irregular heart beat, dizziness, or weakness. Call 911 for emergency such as chest pain, fainting, struggling to breathe. Please follow up with your health care provider as recommended.      . Client will report no worsening of symptoms related to heart disease within the next 9-12 months   On track    Please continue to choose healthy lifestyle choices related to diet and exercise Take all medications as prescribed Follow up with your primary care provider    . Client will verbalize knowledge of self management of Hypertension as evidences by BP reading of 140/90 or less; or as defined by provider   On track    Take blood pressure medications as prescribed. Plan to check blood pressure regularly and take results to your health care provider appointments. Plan to follow a low salt diet. Increase activity as tolerated. Follow up with your health care provider as recommended. Please ask your health care provider " what is my target blood pressure range". Blood pressure 106/68 on 11/12/19 per medical record review See section on Hypertension in back of HTA calendar    . HEMOGLOBIN A1C < 7.0       Per medical record review Hgb AIC completed on 11/12/19   6.1 Diabetes self management actions as follows- Continue to keep your follow up appointments with your provider and have lab work completed as recommended. Monitor glucose (blood sugar) per health care provider recommendation. Check feet daily Visit health care provider every 3-6 months as directed. It is important to have your Hgb AIC checked every 3-6 months (every 6 months if you are at goal and every 3 months if you are not at goal). Eye exam yearly Carbohydrate controlled meal planning Take diabetes medications as prescribed by health care provider Physical activity Please see diabetes action plan in back of HTA calendar     . Maintain  timely refills of diabetic medication as prescribed within the year .   On track    Take medications as prescribed. Follow up with your health care provider if you have any questions. Please contact your assigned RN care manager if you have difficulty obtaining medications. Review of electronic medical record medication dispense report indicates client maintains timely refills of diabetic medications.      . Obtain annual  Lipid Profile, LDL-C   On track    Per medical record review, Lipid profile completed on 11/12/19 LDL= 39 The goal for LDL is less than 70mg /dl as you are at high risk for complications. Try to avoid saturated fats, trans-fats and eat more fiber. Continue to follow up with your health care provider for any needed lab work.     . Obtain Annual Eye (retinal)  Exam    On track    Review of medical record indicates client completed eye exam 09/21/2019 with negative retinopathy, Client records indicate annual ophthalmology appointments complete. Continue to keep annual diabetic eye  exam appointment and report any changes as soon as possible.    . Obtain Annual Foot Exam       Client states provider checks feet at least 2 times per year. Per client report, foot exam completed on 11/2019 Your doctor should check your feet at least once a year. Plan to schedule a foot exam with your health care provider once every year. Check your skin and feet daily for cuts, bruises, redness, blisters or sore.     . Obtain annual screen for micro albuminuria (urine) , nephropathy (kidney problems)   On track    Per medical record review, microalbuminuria completed on 10/27/18 and per client report 11/2019. Continue to obtain yearly physicals and lab checks as recommended by your health care provider. It is important for your health care provider to check your urine for protein at least once every year.        . Obtain Hemoglobin A1C at least 2 times per year   On track    Client self  reports A1C checked 2 times per year.   Per medical record last A1C was 6.1 on 11/12/2019     . Visit Primary Care Provider or Endocrinologist at least 2 times per year    On track    Client self reports he completes 2 - 3 visits with primary care provider each year. Review of medical record indicates 3 visits completed in 2020 Continue to keep all follow up appointments with you provider.       Plan:    RN care manager faxed today's note with updated individualized care plan to primary care provider, mailed successful outreach letter to client's home with updated individualized care plan, consent form, HTA calendar.  Chronic care management coordination will outreach in:  10-12 months   Kassie Mends Nursing/RN [place] Case Manager, C-SNP  [phone]

## 2019-12-31 ENCOUNTER — Other Ambulatory Visit: Payer: Self-pay | Admitting: Interventional Cardiology

## 2020-03-02 ENCOUNTER — Telehealth: Payer: Self-pay | Admitting: Interventional Cardiology

## 2020-03-02 NOTE — Telephone Encounter (Signed)
Spoke with pt and reviewed recommendations per Dr. Tamala Julian.  Pt agreeable to plan.

## 2020-03-02 NOTE — Telephone Encounter (Addendum)
Pt c/o swelling: STAT is pt has developed SOB within 24 hours  1) How much weight have you gained and in what time span? Patient has not gained weight  2) If swelling, where is the swelling located? Swollen  3) Are you currently taking a fluid pill? yes  4) Are you currently SOB? no  5) Do you have a log of your daily weights (if so, list)? no  6) Have you gained 3 pounds in a day or 5 pounds in a week? no  7) Have you traveled recently? Went to [place] last week   Patient states he woke up this morning and his ankles were swollen. He states the right is more swollen than the left and he is not having any other symptoms. Please advise.

## 2020-03-02 NOTE — Telephone Encounter (Signed)
Okay to take Furosemide 40 mg daily for 2 or 3 days to clear swelling and improve breathing then back to 20 mg daily.

## 2020-03-02 NOTE — Telephone Encounter (Signed)
Pt developed swelling over the last two days.  Recently came back from vacation in [place] and has been active with swimming and walking.  Has slight SOB occasionally but doesn't notice when he's up moving around and busy.  Has not tried elevating legs.  No weights or vitals available.  Advised pt ok to take one extra Furosemide but no more additional doses due to kidney function until I hear back from Dr. Tamala Julian.  He is wearing compression stockings today with some improvement.  Advised pt to elevate legs as much as possible and continue wearing compression stockings until swelling resolves.  Advised if no improvement by Monday, call us back.  Pt verbalized understanding and was in agreement with plan.  Will route to Dr. Tamala Julian for review to see if further recommendations.

## 2020-04-21 ENCOUNTER — Other Ambulatory Visit: Payer: Self-pay | Admitting: Interventional Cardiology

## 2020-05-03 NOTE — Progress Notes (Signed)
Cardiology Office Note:    Date:  05/04/2020   ID:  Marc [name], DOB [birth year], MRN [id number]  PCP:  Lawerance Cruel, MD  Cardiologist:  Sinclair Grooms, MD   Referring MD: Lawerance Cruel, MD   Chief Complaint  Patient presents with  . Coronary Artery Disease  . Congestive Heart Failure    History of Present Illness:    Marc [name] is a 69 y.o. male with a hx of diabetes mellitus type 2, coronary artery disease with prior large anterior infarction, chronic combined systolic and diastolic heart failure, hypertension, hyperlipidemia, CKD stage III, and mild obesity.  [name] is doing a great job of secondary prevention.  He is physically active, achieving greater than 150 minutes of activity per week.  He is compliant with his medication regimen.  He is swimming 3 days/week.  He has lost weight.  His blood sugars under excellent control with an A1c of 6.1.  He denies angina, orthopnea, PND, and lower extremity swelling.  Past Medical History:  Diagnosis Date  . Cardiomyopathy (Marc [name])    Left ventricle dysfunction with LVEF 35-40% by echo March 2009  . Chest pain   . Coronary atherosclerosis of native coronary artery    with large anterior myocardial infarction 1997. PTCA LAD. Distal circumflex and first diagonal DES 2011  . Diabetes (Marc [name])   . Heart failure (Marc [name])   . HTN (hypertension)   . Hypercholesteremia   . Hypercholesteremia   . Hyperlipidemia   . Hyperlipidemia   . Psoriasis   . Sciatica     Past Surgical History:  Procedure Laterality Date  . CORONARY ANGIOPLASTY WITH STENT PLACEMENT     x 2 Dr. Tamala Julian    Current Medications: Current Meds  Medication Sig  . atorvastatin (LIPITOR) 40 MG tablet TAKE 1 TABLET BY MOUTH EVERY DAY  . betamethasone dipropionate 0.05 % cream Apply topically See admin instructions.  . calcium carbonate (TUMS - DOSED IN MG ELEMENTAL CALCIUM) 500 MG chewable tablet Chew 1 tablet by mouth as needed for  heartburn.  . carvedilol (COREG) 25 MG tablet Take 1 tablet (25 mg total) by mouth 2 (two) times daily with a meal.  . clopidogrel (PLAVIX) 75 MG tablet TAKE 1 TABLET BY MOUTH EVERY DAY  . diclofenac sodium (VOLTAREN) 1 % GEL Apply 2 g topically 4 (four) times daily. PRN knee pain  . ENTRESTO 49-51 MG TAKE 1 TABLET BY MOUTH TWICE A DAY  . ezetimibe (ZETIA) 10 MG tablet TAKE 1 TABLET BY MOUTH EVERY DAY  . famotidine (PEPCID) 40 MG tablet Take 40 mg by mouth daily.  . fish oil-omega-3 fatty acids 1000 MG capsule Take 3 g by mouth daily.  . furosemide (LASIX) 20 MG tablet TAKE 1 TABLET BY MOUTH EVERY DAY  . glimepiride (AMARYL) 4 MG tablet Take 4 mg by mouth daily.  . metFORMIN (GLUCOPHAGE) 500 MG tablet Take 1,000 mg by mouth 2 (two) times daily with a meal.   . Multiple Vitamin (MULTIVITAMIN) capsule Take 1 capsule by mouth daily.  . nitroGLYCERIN (NITROSTAT) 0.4 MG SL tablet PLACE 1 TABLET UNDER THE TONGUE EVERY 5 MINUTES AS NEEDED FOR CHEST PAIN  . selenium sulfide (SELSUN) 2.5 % shampoo Apply 1 application topically daily as needed for itching.      Allergies:   Patient has no known allergies.   Social History   Socioeconomic History  . Marital status: Married    Spouse name: Not on file  .  Number of children: Not on file  . Years of education: Not on file  . Highest education level: Not on file  Occupational History  . Not on file  Tobacco Use  . Smoking status: Never Smoker  . Smokeless tobacco: Never Used  Vaping Use  . Vaping Use: Never used  Substance and Sexual Activity  . Alcohol use: Yes    Alcohol/week: 0.0 standard drinks    Comment: occasional  . Drug use: No  . Sexual activity: Not on file  Other Topics Concern  . Not on file  Social History Narrative  . Not on file   Social Determinants of Health   Financial Resource Strain:   . Difficulty of Paying Living Expenses: Not on file  Food Insecurity:   . Worried About Charity fundraiser in the Last Year:  Not on file  . Ran Out of Food in the Last Year: Not on file  Transportation Needs: No Transportation Needs  . Lack of Transportation (Medical): No  . Lack of Transportation (Non-Medical): No  Physical Activity:   . Days of Exercise per Week: Not on file  . Minutes of Exercise per Session: Not on file  Stress:   . Feeling of Stress : Not on file  Social Connections:   . Frequency of Communication with Friends and Family: Not on file  . Frequency of Social Gatherings with Friends and Family: Not on file  . Attends Religious Services: Not on file  . Active Member of Clubs or Organizations: Not on file  . Attends Archivist Meetings: Not on file  . Marital Status: Not on file     Family History: The patient's family history includes Arrhythmia in his mother; Diabetes in his mother; Healthy in his brother and sister; Heart failure in his mother; Hypertension in his mother; Prostate cancer in his brother and father; Stroke in his father; Thyroid disease in his brother.  ROS:   Please see the history of present illness.    Acid reflux has been a significant issue.  This is finally under control after discontinuing aspirin, taking H2 receptor blocker therapy, and sleeping with elevation.  All other systems reviewed and are negative.  EKGs/Labs/Other Studies Reviewed:    The following studies were reviewed today: No new functional data  EKG:  EKG a new tracing is not performed  Recent Labs: 11/12/2019: ALT 11; BUN 20; Creatinine, Ser 1.48; Hemoglobin 11.5; Platelets 142; Potassium 4.6; Sodium 141  Recent Lipid Panel    Component Value Date/Time   CHOL 71 (L) 11/12/2019 1209   TRIG 64 11/12/2019 1209   HDL 26 (L) 11/12/2019 1209   CHOLHDL 2.7 11/12/2019 1209   CHOLHDL 3.2 06/26/2015 0856   VLDL 24 06/26/2015 0856   LDLCALC 30 11/12/2019 1209    Physical Exam:    VS:  BP 114/70   Pulse 90   Ht $R'5\' 7"'xT$  (1.702 m)   Wt 171 lb (77.6 kg)   SpO2 99%   BMI 26.78 kg/m      Wt Readings from Last 3 Encounters:  05/04/20 171 lb (77.6 kg)  11/12/19 171 lb 12.8 oz (77.9 kg)  04/28/19 175 lb 1.9 oz (79.4 kg)     GEN: Looks great.  Weight is in good control.. No acute distress HEENT: Normal NECK: No JVD. LYMPHATICS: No lymphadenopathy CARDIAC:  RRR without murmur, gallop, or edema. VASCULAR:  Normal Pulses. No bruits. RESPIRATORY:  Clear to auscultation without rales, wheezing or rhonchi  ABDOMEN: Soft, non-tender, non-distended, No pulsatile mass, MUSCULOSKELETAL: No deformity  SKIN: Warm and dry NEUROLOGIC:  Alert and oriented x 3 PSYCHIATRIC:  Normal affect   ASSESSMENT:    1. Coronary artery disease involving native coronary artery of native heart with angina pectoris ([place])   2. Chronic combined systolic and diastolic CHF, NYHA class 2 (HCC)   3. Other hyperlipidemia   4. Essential hypertension   5. CKD stage 3 secondary to diabetes ([place])   6. Educated about COVID-19 virus infection   7. Hyperglycemia   8. Type 2 diabetes mellitus with other circulatory complication, without long-term current use of insulin (HCC)    PLAN:    In order of problems listed above:  1. Secondary prevention is bringing practiced in an excellent fashion. 2. Heart failure therapy regimen includes Entresto and and carvedilol.  We tried SGLT2 therapy but he developed yeast infection. 3. Lipids are being managed excellently with LDL less than 50 on 40 mg of Lipitor. 4. Excellent blood pressure control at 114/70 mmHg.  Continue Lasix, carvedilol, and Entresto.  Kidney function will be reassessed today with a be met. 5. We will see what kidney function is like today from be met. 6. He is vaccinated and practicing social distancing. 7. Hemoglobin A1c will be done today.  Overall education and awareness concerning primary/secondary risk prevention was discussed in detail: LDL less than 70, hemoglobin A1c less than 7, blood pressure target less than 130/80 mmHg, >150 minutes  of moderate aerobic activity per week, avoidance of smoking, weight control (via diet and exercise), and continued surveillance/management of/for obstructive sleep apnea.   Medication Adjustments/Labs and Tests Ordered: Current medicines are reviewed at length with the patient today.  Concerns regarding medicines are outlined above.  Orders Placed This Encounter  Procedures  . Basic metabolic panel  . HgB A1c   No orders of the defined types were placed in this encounter.   Patient Instructions  Medication Instructions:  Your physician recommends that you continue on your current medications as directed. Please refer to the Current Medication list given to you today.  *If you need a refill on your cardiac medications before your next appointment, please call your pharmacy*   Lab Work: BMET, A1C today  If you have labs (blood work) drawn today and your tests are completely normal, you will receive your results only by: Marland Kitchen MyChart Message (if you have MyChart) OR . A paper copy in the mail If you have any lab test that is abnormal or we need to change your treatment, we will call you to review the results.   Testing/Procedures: None   Follow-Up: At [facility], you and your health needs are our priority.  As part of our continuing mission to provide you with exceptional heart care, we have created designated Provider Care Teams.  These Care Teams include your primary Cardiologist (physician) and Advanced Practice Providers (APPs -  Physician Assistants and Nurse Practitioners) who all work together to provide you with the care you need, when you need it.  We recommend signing up for the patient portal called "MyChart".  Sign up information is provided on this After Visit Summary.  MyChart is used to connect with patients for Virtual Visits (Telemedicine).  Patients are able to view lab/test results, encounter notes, upcoming appointments, etc.  Non-urgent messages can be sent to  your provider as well.   To learn more about what you can do with MyChart, go to [email or web address].  Your next appointment:   6 month(s)  The format for your next appointment:   In Person  Provider:   You may see Sinclair Grooms, MD or one of the following Advanced Practice Providers on your designated Care Team:    Truitt Merle, NP  Cecilie Kicks, NP  Kathyrn Drown, NP    Other Instructions      Signed, Sinclair Grooms, MD  05/04/2020 10:38 AM    [place]

## 2020-05-04 ENCOUNTER — Ambulatory Visit: Payer: HMO | Admitting: Interventional Cardiology

## 2020-05-04 ENCOUNTER — Encounter: Payer: Self-pay | Admitting: Interventional Cardiology

## 2020-05-04 ENCOUNTER — Other Ambulatory Visit: Payer: Self-pay

## 2020-05-04 VITALS — BP 114/70 | HR 90 | Ht 67.0 in | Wt 171.0 lb

## 2020-05-04 DIAGNOSIS — I5042 Chronic combined systolic (congestive) and diastolic (congestive) heart failure: Secondary | ICD-10-CM

## 2020-05-04 DIAGNOSIS — Z7189 Other specified counseling: Secondary | ICD-10-CM

## 2020-05-04 DIAGNOSIS — I10 Essential (primary) hypertension: Secondary | ICD-10-CM | POA: Diagnosis not present

## 2020-05-04 DIAGNOSIS — E7849 Other hyperlipidemia: Secondary | ICD-10-CM | POA: Diagnosis not present

## 2020-05-04 DIAGNOSIS — E1159 Type 2 diabetes mellitus with other circulatory complications: Secondary | ICD-10-CM

## 2020-05-04 DIAGNOSIS — I25119 Atherosclerotic heart disease of native coronary artery with unspecified angina pectoris: Secondary | ICD-10-CM | POA: Diagnosis not present

## 2020-05-04 DIAGNOSIS — R739 Hyperglycemia, unspecified: Secondary | ICD-10-CM

## 2020-05-04 DIAGNOSIS — N183 Chronic kidney disease, stage 3 unspecified: Secondary | ICD-10-CM

## 2020-05-04 DIAGNOSIS — E1122 Type 2 diabetes mellitus with diabetic chronic kidney disease: Secondary | ICD-10-CM

## 2020-05-04 NOTE — Patient Instructions (Addendum)
Medication Instructions:  Your physician recommends that you continue on your current medications as directed. Please refer to the Current Medication list given to you today.  *If you need a refill on your cardiac medications before your next appointment, please call your pharmacy*   Lab Work: BMET, A1C today  If you have labs (blood work) drawn today and your tests are completely normal, you will receive your results only by: Marland Kitchen MyChart Message (if you have MyChart) OR . A paper copy in the mail If you have any lab test that is abnormal or we need to change your treatment, we will call you to review the results.   Testing/Procedures: None   Follow-Up: At [facility], you and your health needs are our priority.  As part of our continuing mission to provide you with exceptional heart care, we have created designated Provider Care Teams.  These Care Teams include your primary Cardiologist (physician) and Advanced Practice Providers (APPs -  Physician Assistants and Nurse Practitioners) who all work together to provide you with the care you need, when you need it.  We recommend signing up for the patient portal called "MyChart".  Sign up information is provided on this After Visit Summary.  MyChart is used to connect with patients for Virtual Visits (Telemedicine).  Patients are able to view lab/test results, encounter notes, upcoming appointments, etc.  Non-urgent messages can be sent to your provider as well.   To learn more about what you can do with MyChart, go to [email or web address].    Your next appointment:   6 month(s)  The format for your next appointment:   In Person  Provider:   You may see Sinclair Grooms, MD or one of the following Advanced Practice Providers on your designated Care Team:    Truitt Merle, NP  Cecilie Kicks, NP  Kathyrn Drown, NP    Other Instructions

## 2020-05-05 LAB — BASIC METABOLIC PANEL
BUN/Creatinine Ratio: 20 (ref 10–24)
BUN: 30 mg/dL — ABNORMAL HIGH (ref 8–27)
CO2: 23 mmol/L (ref 20–29)
Calcium: 9.5 mg/dL (ref 8.6–10.2)
Chloride: 103 mmol/L (ref 96–106)
Creatinine, Ser: 1.51 mg/dL — ABNORMAL HIGH (ref 0.76–1.27)
GFR calc Af Amer: 54 mL/min/{1.73_m2} — ABNORMAL LOW (ref >59)
GFR calc non Af Amer: 47 mL/min/{1.73_m2} — ABNORMAL LOW (ref >59)
Glucose: 139 mg/dL — ABNORMAL HIGH (ref 65–99)
Potassium: 5.2 mmol/L (ref 3.5–5.2)
Sodium: 139 mmol/L (ref 134–144)

## 2020-05-05 LAB — HEMOGLOBIN A1C
Est. average glucose Bld gHb Est-mCnc: 143 mg/dL
Hgb A1c MFr Bld: 6.6 % — ABNORMAL HIGH (ref 4.8–5.6)

## 2020-05-19 ENCOUNTER — Other Ambulatory Visit: Payer: Self-pay | Admitting: Interventional Cardiology

## 2020-08-03 ENCOUNTER — Other Ambulatory Visit: Payer: Self-pay | Admitting: *Deleted

## 2020-08-03 NOTE — Patient Outreach (Signed)
  [facility]) Care Management Chronic Special Needs Program    08/03/2020  Name: Marc [name], DOB: [birth year]  MRN: [id number]   [name] is enrolled in a chronic special needs plan for Diabetes.  [facility] care management will continue to provide services for this member through 08/18/20.  [facility] care management team will assume care 08/19/20.  Jacqlyn Larsen [facility], BSN [place], [place]

## 2020-08-24 ENCOUNTER — Other Ambulatory Visit: Payer: Self-pay | Admitting: *Deleted

## 2020-08-28 ENCOUNTER — Other Ambulatory Visit: Payer: Self-pay | Admitting: Interventional Cardiology

## 2020-10-18 ENCOUNTER — Ambulatory Visit: Payer: HMO | Admitting: *Deleted

## 2020-10-23 NOTE — Progress Notes (Signed)
Cardiology Office Note:    Date:  10/24/2020   ID:  Marc [name], DOB 11/10/1950, MRN [id number]  PCP:  Lawerance Cruel, MD  Cardiologist:  Sinclair Grooms, MD   Referring MD: Lawerance Cruel, MD   Chief Complaint  Patient presents with  . Congestive Heart Failure  . Shortness of Breath    History of Present Illness:    Marc [name] is a 70 y.o. male with a hx of diabetes mellitus type 2, coronary artery disease with prior large anterior infarction, chronic combined systolic and diastolic heart failure, hypertension, hyperlipidemia, CKD stage III, and mild obesity.  Right leg giving out of energy with minimal physical activity.  He is not lightheaded or dizzy.  He denies angina.  He does have some nocturnal wheezing for which his wife will awaken him.  When he awakens he is not particularly short of breath.  When he is up and moving about he is not short of breath.  He is not short of breath today and denies angina.  Went upper GI endoscopy a month ago and no significant lesions were found.  Has had low hemoglobin dating back to 2011 at which time it was 12.5.  It was 11.5, 1-year ago when he had borderline indices for iron deficiency.  He was asked to follow-up with Dr. Harrington Challenger concerning the low hemoglobin although it was not significantly worse than prior.  Past Medical History:  Diagnosis Date  . Cardiomyopathy ([place])    Left ventricle dysfunction with LVEF 35-40% by echo March 2009  . Chest pain   . Coronary atherosclerosis of native coronary artery    with large anterior myocardial infarction 1997. PTCA LAD. Distal circumflex and first diagonal DES 2011  . Diabetes ([place])   . Heart failure ([place])   . HTN (hypertension)   . Hypercholesteremia   . Hypercholesteremia   . Hyperlipidemia   . Hyperlipidemia   . Psoriasis   . Sciatica     Past Surgical History:  Procedure Laterality Date  . CORONARY ANGIOPLASTY WITH STENT PLACEMENT     x 2 Dr. Tamala Julian     Current Medications: Current Meds  Medication Sig  . atorvastatin (LIPITOR) 40 MG tablet TAKE 1 TABLET BY MOUTH EVERY DAY  . betamethasone dipropionate 0.05 % cream Apply topically See admin instructions.  . calcium carbonate (TUMS - DOSED IN MG ELEMENTAL CALCIUM) 500 MG chewable tablet Chew 1 tablet by mouth as needed for heartburn.  . carvedilol (COREG) 25 MG tablet Take 1 tablet (25 mg total) by mouth 2 (two) times daily with a meal.  . clopidogrel (PLAVIX) 75 MG tablet TAKE 1 TABLET BY MOUTH EVERY DAY  . diclofenac sodium (VOLTAREN) 1 % GEL Apply 2 g topically 4 (four) times daily. PRN knee pain  . ENTRESTO 49-51 MG TAKE 1 TABLET BY MOUTH TWICE A DAY  . ezetimibe (ZETIA) 10 MG tablet TAKE 1 TABLET BY MOUTH EVERY DAY  . fish oil-omega-3 fatty acids 1000 MG capsule Take 3 g by mouth daily.  Marland Kitchen glimepiride (AMARYL) 4 MG tablet Take 4 mg by mouth daily.  . metFORMIN (GLUCOPHAGE) 500 MG tablet Take 1,000 mg by mouth 2 (two) times daily with a meal.   . Multiple Vitamin (MULTIVITAMIN) capsule Take 1 capsule by mouth daily.  . nitroGLYCERIN (NITROSTAT) 0.4 MG SL tablet PLACE 1 TABLET UNDER THE TONGUE EVERY 5 MINUTES AS NEEDED FOR CHEST PAIN  . selenium sulfide (SELSUN) 2.5 % shampoo Apply  1 application topically daily as needed for itching.   . [DISCONTINUED] furosemide (LASIX) 20 MG tablet TAKE 1 TABLET BY MOUTH EVERY DAY     Allergies:   Candesartan cilexetil and Spironolactone   Social History   Socioeconomic History  . Marital status: Married    Spouse name: Not on file  . Number of children: Not on file  . Years of education: Not on file  . Highest education level: Not on file  Occupational History  . Not on file  Tobacco Use  . Smoking status: Never Smoker  . Smokeless tobacco: Never Used  Vaping Use  . Vaping Use: Never used  Substance and Sexual Activity  . Alcohol use: Yes    Alcohol/week: 0.0 standard drinks    Comment: occasional  . Drug use: No  . Sexual  activity: Not on file  Other Topics Concern  . Not on file  Social History Narrative  . Not on file   Social Determinants of Health   Financial Resource Strain: Not on file  Food Insecurity: Not on file  Transportation Needs: No Transportation Needs  . Lack of Transportation (Medical): No  . Lack of Transportation (Non-Medical): No  Physical Activity: Not on file  Stress: Not on file  Social Connections: Not on file     Family History: The patient's family history includes Arrhythmia in his mother; Diabetes in his mother; Healthy in his brother and sister; Heart failure in his mother; Hypertension in his mother; Prostate cancer in his brother and father; Stroke in his father; Thyroid disease in his brother.  ROS:   Please see the history of present illness.    Decreased energy.  Appetite is adequate.  Recently had a lot of reflux symptoms.  Doubled up on Protonix and now feeling better.  Denies melena and hematochezia.  All other systems reviewed and are negative.  EKGs/Labs/Other Studies Reviewed:    The following studies were reviewed today: None.  Last echo 2018.  EKG:  EKG sinus rhythm, left anterior hemiblock, first-degree AV block, nonspecific ST abnormality.  Compared to March 2021, faster.  Recent Labs: 11/12/2019: ALT 11; Hemoglobin 11.5; Platelets 142 05/04/2020: BUN 30; Creatinine, Ser 1.51; Potassium 5.2; Sodium 139  Recent Lipid Panel    Component Value Date/Time   CHOL 71 (L) 11/12/2019 1209   TRIG 64 11/12/2019 1209   HDL 26 (L) 11/12/2019 1209   CHOLHDL 2.7 11/12/2019 1209   CHOLHDL 3.2 06/26/2015 0856   VLDL 24 06/26/2015 0856   LDLCALC 30 11/12/2019 1209    Physical Exam:    VS:  BP (!) 88/56   Pulse 97   Ht $R'5\' 7"'fi$  (1.702 m)   Wt 169 lb (76.7 kg)   SpO2 97%   BMI 26.47 kg/m     Wt Readings from Last 3 Encounters:  10/24/20 169 lb (76.7 kg)  05/04/20 171 lb (77.6 kg)  11/12/19 171 lb 12.8 oz (77.9 kg)     GEN: Appears pale.. No acute  distress HEENT: Conjunctival pallor. NECK: No JVD. LYMPHATICS: No lymphadenopathy CARDIAC: No murmur. RRR S3 gallop, and trace ankle edema. VASCULAR:  Normal Pulses. No bruits. RESPIRATORY:  Clear to auscultation without rales, wheezing or rhonchi  ABDOMEN: Soft, non-tender, non-distended, No pulsatile mass, MUSCULOSKELETAL: No deformity  SKIN: Warm and dry NEUROLOGIC:  Alert and oriented x 3 PSYCHIATRIC:  Normal affect   ASSESSMENT:    1. Chronic combined systolic and diastolic CHF, NYHA class 2 ([place])   2. Coronary artery  disease involving native coronary artery of native heart with angina pectoris ([place])   3. Other hyperlipidemia   4. Essential hypertension   5. CKD stage 3 secondary to diabetes ([place])   6. Educated about COVID-19 virus infection    PLAN:    In order of problems listed above:  1. 2D Doppler echocardiogram to assess LV systolic function. 2. Secondary prevention discussed. 3. Continue Lipitor 40 mg/day. 4. Blood pressure is low.  Hold Lasix for 2 days then decrease to 20 mg Monday Wednesday and Friday 5. Check basic metabolic panel today to assess kidney function. 6. Hemoglobin performed on May 29, 2020 was 10.5.  He does not recall having this mentioned.  We did tell him to see his primary care doctor last April because of a hemoglobin of 11.5.  The GI doctor who did the upper endoscopy recently apparently did no blood work.  CBC is obtained today. 7. Did not discuss Covid.  2D echo, c-Met, BNP, CBC, will be done as soon as possible.  Follow-up in no greater than 3 weeks.  May need to be seen earlier depending upon findings.  Cautioned to see his primary care doctor about anemia.   Medication Adjustments/Labs and Tests Ordered: Current medicines are reviewed at length with the patient today.  Concerns regarding medicines are outlined above.  Orders Placed This Encounter  Procedures  . Basic metabolic panel  . Hepatic function panel  . CBC  . Pro b  natriuretic peptide  . EKG 12-Lead  . ECHOCARDIOGRAM COMPLETE   Meds ordered this encounter  Medications  . furosemide (LASIX) 20 MG tablet    Sig: Take 1 tablet (20 mg total) by mouth every Monday, Wednesday, and Friday.    Dispense:  45 tablet    Refill:  1    Dose change    Patient Instructions  Medication Instructions:  1) DECREASE Furosemide to just Monday, Wednesday and Friday.  *If you need a refill on your cardiac medications before your next appointment, please call your pharmacy*   Lab Work: BMET, Liver, CBC and Pro BNP today  If you have labs (blood work) drawn today and your tests are completely normal, you will receive your results only by: Marland Kitchen MyChart Message (if you have MyChart) OR . A paper copy in the mail If you have any lab test that is abnormal or we need to change your treatment, we will call you to review the results.   Testing/Procedures: Your physician has requested that you have an echocardiogram. Echocardiography is a painless test that uses sound waves to create images of your heart. It provides your doctor with information about the size and shape of your heart and how well your heart's chambers and valves are working. This procedure takes approximately one hour. There are no restrictions for this procedure.   Follow-Up: At [facility], you and your health needs are our priority.  As part of our continuing mission to provide you with exceptional heart care, we have created designated Provider Care Teams.  These Care Teams include your primary Cardiologist (physician) and Advanced Practice Providers (APPs -  Physician Assistants and Nurse Practitioners) who all work together to provide you with the care you need, when you need it.  We recommend signing up for the patient portal called "MyChart".  Sign up information is provided on this After Visit Summary.  MyChart is used to connect with patients for Virtual Visits (Telemedicine).  Patients are able  to view lab/test results,  encounter notes, upcoming appointments, etc.  Non-urgent messages can be sent to your provider as well.   To learn more about what you can do with MyChart, go to [email or web address].    Your next appointment:   1 month(s)  The format for your next appointment:   In Person  Provider:   You may see Sinclair Grooms, MD or one of the following Advanced Practice Providers on your designated Care Team:    Kathyrn Drown, NP    Other Instructions      Signed, Sinclair Grooms, MD  10/24/2020 11:09 AM    [place]

## 2020-10-24 ENCOUNTER — Ambulatory Visit (INDEPENDENT_AMBULATORY_CARE_PROVIDER_SITE_OTHER): Payer: HMO | Admitting: Interventional Cardiology

## 2020-10-24 ENCOUNTER — Encounter: Payer: Self-pay | Admitting: Interventional Cardiology

## 2020-10-24 ENCOUNTER — Other Ambulatory Visit: Payer: Self-pay

## 2020-10-24 VITALS — BP 88/56 | HR 97 | Ht 67.0 in | Wt 169.0 lb

## 2020-10-24 DIAGNOSIS — I25119 Atherosclerotic heart disease of native coronary artery with unspecified angina pectoris: Secondary | ICD-10-CM

## 2020-10-24 DIAGNOSIS — E1122 Type 2 diabetes mellitus with diabetic chronic kidney disease: Secondary | ICD-10-CM

## 2020-10-24 DIAGNOSIS — Z7189 Other specified counseling: Secondary | ICD-10-CM

## 2020-10-24 DIAGNOSIS — I10 Essential (primary) hypertension: Secondary | ICD-10-CM

## 2020-10-24 DIAGNOSIS — I5042 Chronic combined systolic (congestive) and diastolic (congestive) heart failure: Secondary | ICD-10-CM

## 2020-10-24 DIAGNOSIS — E7849 Other hyperlipidemia: Secondary | ICD-10-CM | POA: Diagnosis not present

## 2020-10-24 DIAGNOSIS — N183 Chronic kidney disease, stage 3 unspecified: Secondary | ICD-10-CM

## 2020-10-24 MED ORDER — FUROSEMIDE 20 MG PO TABS: 20.0000 mg | ORAL_TABLET | ORAL | 1 refills | Status: DC

## 2020-10-24 NOTE — Patient Instructions (Addendum)
Medication Instructions:  1) DECREASE Furosemide to just Monday, Wednesday and Friday.  *If you need a refill on your cardiac medications before your next appointment, please call your pharmacy*   Lab Work: BMET, Liver, CBC and Pro BNP today  If you have labs (blood work) drawn today and your tests are completely normal, you will receive your results only by: Marland Kitchen MyChart Message (if you have MyChart) OR . A paper copy in the mail If you have any lab test that is abnormal or we need to change your treatment, we will call you to review the results.   Testing/Procedures: Your physician has requested that you have an echocardiogram. Echocardiography is a painless test that uses sound waves to create images of your heart. It provides your doctor with information about the size and shape of your heart and how well your heart's chambers and valves are working. This procedure takes approximately one hour. There are no restrictions for this procedure.   Follow-Up: At [facility], you and your health needs are our priority.  As part of our continuing mission to provide you with exceptional heart care, we have created designated Provider Care Teams.  These Care Teams include your primary Cardiologist (physician) and Advanced Practice Providers (APPs -  Physician Assistants and Nurse Practitioners) who all work together to provide you with the care you need, when you need it.  We recommend signing up for the patient portal called "MyChart".  Sign up information is provided on this After Visit Summary.  MyChart is used to connect with patients for Virtual Visits (Telemedicine).  Patients are able to view lab/test results, encounter notes, upcoming appointments, etc.  Non-urgent messages can be sent to your provider as well.   To learn more about what you can do with MyChart, go to [email or web address].    Your next appointment:   1 month(s)  The format for your next appointment:   In  Person  Provider:   You may see Sinclair Grooms, MD or one of the following Advanced Practice Providers on your designated Care Team:    Kathyrn Drown, NP    Other Instructions

## 2020-10-25 LAB — HEPATIC FUNCTION PANEL
ALT: 33 IU/L (ref 0–44)
AST: 29 IU/L (ref 0–40)
Albumin: 4 g/dL (ref 3.8–4.8)
Alkaline Phosphatase: 64 IU/L (ref 44–121)
Bilirubin Total: 0.8 mg/dL (ref 0.0–1.2)
Bilirubin, Direct: 0.25 mg/dL (ref 0.00–0.40)
Total Protein: 6.5 g/dL (ref 6.0–8.5)

## 2020-10-25 LAB — BASIC METABOLIC PANEL
BUN/Creatinine Ratio: 21 (ref 10–24)
BUN: 41 mg/dL — ABNORMAL HIGH (ref 8–27)
CO2: 22 mmol/L (ref 20–29)
Calcium: 9.4 mg/dL (ref 8.6–10.2)
Chloride: 100 mmol/L (ref 96–106)
Creatinine, Ser: 1.95 mg/dL — ABNORMAL HIGH (ref 0.76–1.27)
Glucose: 134 mg/dL — ABNORMAL HIGH (ref 65–99)
Potassium: 4.3 mmol/L (ref 3.5–5.2)
Sodium: 136 mmol/L (ref 134–144)
eGFR: 37 mL/min/{1.73_m2} — ABNORMAL LOW (ref >59)

## 2020-10-25 LAB — CBC
Hematocrit: 29.4 % — ABNORMAL LOW (ref 37.5–51.0)
Hemoglobin: 8.7 g/dL — ABNORMAL LOW (ref 13.0–17.7)
MCH: 23.9 pg — ABNORMAL LOW (ref 26.6–33.0)
MCHC: 29.6 g/dL — ABNORMAL LOW (ref 31.5–35.7)
MCV: 81 fL (ref 79–97)
Platelets: 156 10*3/uL (ref 150–450)
RBC: 3.64 x10E6/uL — ABNORMAL LOW (ref 4.14–5.80)
RDW: 16.6 % — ABNORMAL HIGH (ref 11.6–15.4)
WBC: 6.1 10*3/uL (ref 3.4–10.8)

## 2020-10-25 LAB — PRO B NATRIURETIC PEPTIDE: NT-Pro BNP: 1685 pg/mL — ABNORMAL HIGH (ref 0–376)

## 2020-10-28 ENCOUNTER — Telehealth: Payer: Self-pay | Admitting: Interventional Cardiology

## 2020-10-28 NOTE — Telephone Encounter (Signed)
   Spoke to Mr. Fussner on 10/28/2020. No longer dizzy on Lasix 20 mg M, W, F but more dyspneic.  Decrease Carvedilol to 12.5 mg BID; Decrease Entresto 49/51 mg(Tablets) to 1/2 tablet BID  Furosemide to 40 mg today and tomorrow then reassess on Monday, for continuing either 40 mg or 20 mg daily.  Will need repeat BMET this week.

## 2020-10-30 ENCOUNTER — Ambulatory Visit: Payer: HMO | Admitting: Interventional Cardiology

## 2020-10-30 ENCOUNTER — Encounter: Payer: Self-pay | Admitting: Interventional Cardiology

## 2020-10-30 ENCOUNTER — Telehealth: Payer: Self-pay | Admitting: Interventional Cardiology

## 2020-10-30 ENCOUNTER — Other Ambulatory Visit: Payer: Self-pay

## 2020-10-30 VITALS — BP 104/70 | HR 109 | Ht 67.0 in | Wt 170.4 lb

## 2020-10-30 DIAGNOSIS — I5042 Chronic combined systolic (congestive) and diastolic (congestive) heart failure: Secondary | ICD-10-CM

## 2020-10-30 DIAGNOSIS — D649 Anemia, unspecified: Secondary | ICD-10-CM | POA: Diagnosis not present

## 2020-10-30 NOTE — Telephone Encounter (Signed)
Left message to call back  

## 2020-10-30 NOTE — Telephone Encounter (Signed)
Call received from [place].  Hemacult stool test came back negative.

## 2020-10-30 NOTE — Telephone Encounter (Signed)
Marc [name] with [facility], Dr. Harrington Challenger' office, states she is returning a call to Dr. Thompson Caul nurse regarding a medication. No additional information or name of medication provided. Please return call to [place] when available at [phone]

## 2020-10-30 NOTE — Patient Instructions (Signed)
Medication Instructions:  Your physician recommends that you continue on your current medications as directed. Please refer to the Current Medication list given to you today.  *If you need a refill on your cardiac medications before your next appointment, please call your pharmacy*   Lab Work: BMET and CBC today  If you have labs (blood work) drawn today and your tests are completely normal, you will receive your results only by: Marland Kitchen MyChart Message (if you have MyChart) OR . A paper copy in the mail If you have any lab test that is abnormal or we need to change your treatment, we will call you to review the results.   Testing/Procedures: None   Follow-Up:  Keep current appointment scheduled November 17, 2020   Other Instructions

## 2020-10-30 NOTE — Progress Notes (Signed)
Cardiology Office Note:    Date:  10/30/2020   ID:  Marc [name], DOB 24-Dec-1950, MRN [id number]  PCP:  Marc Cruel, MD  Cardiologist:  Marc Grooms, MD   Referring MD: Marc Cruel, MD   Chief Complaint  Patient presents with  . Congestive Heart Failure  . Coronary Artery Disease  . Follow-up    Iron deficiency anemia    History of Present Illness:    Marc [name] is a 70 y.o. male with a hx of diabetes mellitus type 2, coronary artery disease with prior large anterior infarction, chronic combined systolic and diastolic heart failure, hypertension, hyperlipidemia, CKD stage III, mild obesity, severe progressive anemia with indices and iron studies suggesting iron deficiency..  Marc [name] follows up today for CHF.  He was seen last week.  It was discovered that hemoglobin was quite low at 8.7.  Plavix was discontinued.  Stool for occult blood by Dr. Melinda [name] was negative.  Because of relative hypotension and azotemia I initially cut back on furosemide.  Patient became markedly more short of breath and retained fluid in his lower extremities.  Subsequently cut carvedilol to 12.5 mg twice daily add Entresto to one half of a 49/51 mg tablet twice daily.  At that point furosemide was resumed on 10/28/2020 and he has had a dramatic diuresis and improvement in breathing.  He was having significant orthopnea and having to sit up.  He was noticing lower extremity swelling his weight today is near his weight on 10/24/2020.  The patient was able to sleep in bed last night after 2 successive days of Lasix 40 mg each a.m.  Past Medical History:  Diagnosis Date  . Cardiomyopathy ([place])    Left ventricle dysfunction with LVEF 35-40% by echo March 2009  . Chest pain   . Coronary atherosclerosis of native coronary artery    with large anterior myocardial infarction 1997. PTCA LAD. Distal circumflex and first diagonal DES 2011  . Diabetes ([place])   . Heart failure ([place])    . HTN (hypertension)   . Hypercholesteremia   . Hypercholesteremia   . Hyperlipidemia   . Hyperlipidemia   . Psoriasis   . Sciatica     Past Surgical History:  Procedure Laterality Date  . CORONARY ANGIOPLASTY WITH STENT PLACEMENT     x 2 Dr. Tamala [name]    Current Medications: Current Meds  Medication Sig  . atorvastatin (LIPITOR) 40 MG tablet TAKE 1 TABLET BY MOUTH EVERY DAY  . betamethasone dipropionate 0.05 % cream Apply topically See admin instructions.  . calcium carbonate (TUMS - DOSED IN MG ELEMENTAL CALCIUM) 500 MG chewable tablet Chew 1 tablet by mouth as needed for heartburn.  . carvedilol (COREG) 25 MG tablet Take 1 tablet (25 mg total) by mouth 2 (two) times daily with a meal.  . clopidogrel (PLAVIX) 75 MG tablet TAKE 1 TABLET BY MOUTH EVERY DAY  . diclofenac sodium (VOLTAREN) 1 % GEL Apply 2 g topically 4 (four) times daily. PRN knee pain  . ENTRESTO 49-51 MG TAKE 1 TABLET BY MOUTH TWICE A DAY  . ezetimibe (ZETIA) 10 MG tablet TAKE 1 TABLET BY MOUTH EVERY DAY  . fish oil-omega-3 fatty acids 1000 MG capsule Take 3 g by mouth daily.  . furosemide (LASIX) 20 MG tablet Take 1 tablet (20 mg total) by mouth every Monday, Wednesday, and Friday.  Marland Kitchen glimepiride (AMARYL) 4 MG tablet Take 4 mg by mouth daily.  . metFORMIN (  GLUCOPHAGE) 500 MG tablet Take 1,000 mg by mouth 2 (two) times daily with a meal.   . Multiple Vitamin (MULTIVITAMIN) capsule Take 1 capsule by mouth daily.  . nitroGLYCERIN (NITROSTAT) 0.4 MG SL tablet PLACE 1 TABLET UNDER THE TONGUE EVERY 5 MINUTES AS NEEDED FOR CHEST PAIN  . pantoprazole (PROTONIX) 40 MG tablet Take 40 mg by mouth in the morning and at bedtime.  . selenium sulfide (SELSUN) 2.5 % shampoo Apply 1 application topically daily as needed for itching.      Allergies:   Candesartan cilexetil and Spironolactone   Social History   Socioeconomic History  . Marital status: Married    Spouse name: Not on file  . Number of children: Not on file  .  Years of education: Not on file  . Highest education level: Not on file  Occupational History  . Not on file  Tobacco Use  . Smoking status: Never Smoker  . Smokeless tobacco: Never Used  Vaping Use  . Vaping Use: Never used  Substance and Sexual Activity  . Alcohol use: Yes    Alcohol/week: 0.0 standard drinks    Comment: occasional  . Drug use: No  . Sexual activity: Not on file  Other Topics Concern  . Not on file  Social History Narrative  . Not on file   Social Determinants of Health   Financial Resource Strain: Not on file  Food Insecurity: Not on file  Transportation Needs: No Transportation Needs  . Lack of Transportation (Medical): No  . Lack of Transportation (Non-Medical): No  Physical Activity: Not on file  Stress: Not on file  Social Connections: Not on file     Family History: The patient's family history includes Arrhythmia in his mother; Diabetes in his mother; Healthy in his brother and sister; Heart failure in his mother; Hypertension in his mother; Prostate cancer in his brother and father; Stroke in his father; Thyroid disease in his brother.  ROS:   Please see the history of present illness.    He denies chest pain.  He has had hoarseness.  All other systems reviewed and are negative.  EKGs/Labs/Other Studies Reviewed:    The following studies were reviewed today: No new data  EKG:  EKG not repeated  Recent Labs: 10/24/2020: ALT 33; BUN 41; Creatinine, Ser 1.95; Hemoglobin 8.7; NT-Pro BNP 1,685; Platelets 156; Potassium 4.3; Sodium 136  Recent Lipid Panel    Component Value Date/Time   CHOL 71 (L) 11/12/2019 1209   TRIG 64 11/12/2019 1209   HDL 26 (L) 11/12/2019 1209   CHOLHDL 2.7 11/12/2019 1209   CHOLHDL 3.2 06/26/2015 0856   VLDL 24 06/26/2015 0856   LDLCALC 30 11/12/2019 1209    Physical Exam:    VS:  BP 104/70   Pulse (!) 109   Ht '5\' 7"'$  (1.702 m)   Wt 170 lb 6.4 oz (77.3 kg)   SpO2 96%   BMI 26.69 kg/m     Wt Readings from  Last 3 Encounters:  10/30/20 170 lb 6.4 oz (77.3 kg)  10/24/20 169 lb (76.7 kg)  05/04/20 171 lb (77.6 kg)     GEN: Grayness to skin complexion. No acute distress HEENT: Normal NECK: No JVD. LYMPHATICS: No lymphadenopathy CARDIAC: No murmur. RRR S3 gallop is present and there is left pedal edema.  This is improved compared to ankle edema noted bilaterally on exam 10/24/2020. VASCULAR:  Normal Pulses. No bruits. RESPIRATORY:  Clear to auscultation without rales, wheezing or rhonchi  ABDOMEN: Soft, non-tender, non-distended, No pulsatile mass, MUSCULOSKELETAL: No deformity  SKIN: Warm and dry NEUROLOGIC:  Alert and oriented x 3 PSYCHIATRIC:  Normal affect   ASSESSMENT:    1. Chronic combined systolic and diastolic CHF, NYHA class 2 ([place])   2. Anemia, unspecified type    PLAN:    In order of problems listed above:  1. Acute on chronic combined systolic and diastolic heart failure precipitated by severe anemia.  Patient had a much better night last evening.  He denies chest pain.  Lower extremity edema is better.  He is not having lightheadedness or dizziness.  A basic metabolic panel is obtained today to help determine if current furosemide dose is appropriate or if there is injury to the kidneys. 2. Upper endoscopy performed 09/29/2020 by Dr. Marin Comment did not reveal abnormality.  We learn today that the fecal occult blood test is unremarkable.  He will likely need to have lower GI evaluation and if no abnormality is found, perhaps hematologic evaluation if his iron hemoglobin does not improve with iron therapy.  We will recheck a hemoglobin today with blood work.   Medication Adjustments/Labs and Tests Ordered: Current medicines are reviewed at length with the patient today.  Concerns regarding medicines are outlined above.  Orders Placed This Encounter  Procedures  . Basic metabolic panel  . CBC   No orders of the defined types were placed in this encounter.   Patient Instructions   Medication Instructions:  Your physician recommends that you continue on your current medications as directed. Please refer to the Current Medication list given to you today.  *If you need a refill on your cardiac medications before your next appointment, please call your pharmacy*   Lab Work: BMET and CBC today  If you have labs (blood work) drawn today and your tests are completely normal, you will receive your results only by: Marland Kitchen MyChart Message (if you have MyChart) OR . A paper copy in the mail If you have any lab test that is abnormal or we need to change your treatment, we will call you to review the results.   Testing/Procedures: None   Follow-Up:  Keep current appointment scheduled November 17, 2020   Other Instructions      Signed, Marc Grooms, MD  10/30/2020 5:34 PM    [facility]

## 2020-10-31 ENCOUNTER — Telehealth: Payer: Self-pay | Admitting: Interventional Cardiology

## 2020-10-31 ENCOUNTER — Other Ambulatory Visit: Payer: Self-pay | Admitting: *Deleted

## 2020-10-31 DIAGNOSIS — I5042 Chronic combined systolic (congestive) and diastolic (congestive) heart failure: Secondary | ICD-10-CM

## 2020-10-31 LAB — CBC
Hematocrit: 29.1 % — ABNORMAL LOW (ref 37.5–51.0)
Hemoglobin: 8.9 g/dL — ABNORMAL LOW (ref 13.0–17.7)
MCH: 24.3 pg — ABNORMAL LOW (ref 26.6–33.0)
MCHC: 30.6 g/dL — ABNORMAL LOW (ref 31.5–35.7)
MCV: 79 fL (ref 79–97)
Platelets: 174 10*3/uL (ref 150–450)
RBC: 3.67 x10E6/uL — ABNORMAL LOW (ref 4.14–5.80)
RDW: 17 % — ABNORMAL HIGH (ref 11.6–15.4)
WBC: 7.2 10*3/uL (ref 3.4–10.8)

## 2020-10-31 LAB — BASIC METABOLIC PANEL
BUN/Creatinine Ratio: 18 (ref 10–24)
BUN: 29 mg/dL — ABNORMAL HIGH (ref 8–27)
CO2: 21 mmol/L (ref 20–29)
Calcium: 9.1 mg/dL (ref 8.6–10.2)
Chloride: 105 mmol/L (ref 96–106)
Creatinine, Ser: 1.64 mg/dL — ABNORMAL HIGH (ref 0.76–1.27)
Glucose: 205 mg/dL — ABNORMAL HIGH (ref 65–99)
Potassium: 4.7 mmol/L (ref 3.5–5.2)
Sodium: 144 mmol/L (ref 134–144)
eGFR: 45 mL/min/{1.73_m2} — ABNORMAL LOW (ref >59)

## 2020-10-31 NOTE — Telephone Encounter (Signed)
Nurse spoke with the patient

## 2020-11-07 ENCOUNTER — Other Ambulatory Visit: Payer: Self-pay

## 2020-11-07 ENCOUNTER — Other Ambulatory Visit: Payer: HMO | Admitting: *Deleted

## 2020-11-07 DIAGNOSIS — I5042 Chronic combined systolic (congestive) and diastolic (congestive) heart failure: Secondary | ICD-10-CM

## 2020-11-07 LAB — BASIC METABOLIC PANEL
BUN/Creatinine Ratio: 14 (ref 10–24)
BUN: 24 mg/dL (ref 8–27)
CO2: 20 mmol/L (ref 20–29)
Calcium: 9.2 mg/dL (ref 8.6–10.2)
Chloride: 100 mmol/L (ref 96–106)
Creatinine, Ser: 1.69 mg/dL — ABNORMAL HIGH (ref 0.76–1.27)
Glucose: 150 mg/dL — ABNORMAL HIGH (ref 65–99)
Potassium: 4.1 mmol/L (ref 3.5–5.2)
Sodium: 137 mmol/L (ref 134–144)
eGFR: 43 mL/min/{1.73_m2} — ABNORMAL LOW (ref >59)

## 2020-11-09 ENCOUNTER — Telehealth: Payer: Self-pay

## 2020-11-09 ENCOUNTER — Telehealth: Payer: Self-pay | Admitting: Interventional Cardiology

## 2020-11-09 ENCOUNTER — Other Ambulatory Visit: Payer: Self-pay | Admitting: Interventional Cardiology

## 2020-11-09 DIAGNOSIS — D649 Anemia, unspecified: Secondary | ICD-10-CM

## 2020-11-09 NOTE — Telephone Encounter (Addendum)
RN returned call to patient regarding lab work. Patient states he was supposed to be discussing an iron transfusion with Dr. Tamala Julian, but has not been able to talk to him. Patient request a message be sent to Dr. Tamala Julian regarding the transfusion.   Call routed to [place] for advice.

## 2020-11-09 NOTE — Telephone Encounter (Signed)
   Patient wants to have iron studies and Ferritin done.  Will add this to CBC

## 2020-11-09 NOTE — Telephone Encounter (Signed)
Patient is requesting to go over his lab results.

## 2020-11-09 NOTE — Telephone Encounter (Signed)
See separate phone note from 3/24

## 2020-11-09 NOTE — Telephone Encounter (Signed)
Scheduled pt for lab work tomorrow.

## 2020-11-09 NOTE — Progress Notes (Signed)
Needs CBC to f/u on Anemia. Still SOB.

## 2020-11-09 NOTE — Telephone Encounter (Signed)
I ordered a CBC. Make sure you can see it and have him to come in for blood draw. He can do it tomorrow. If I did not place the order correctly, please do so. Thanks.

## 2020-11-10 ENCOUNTER — Other Ambulatory Visit: Payer: HMO | Admitting: *Deleted

## 2020-11-10 ENCOUNTER — Other Ambulatory Visit: Payer: Self-pay

## 2020-11-10 DIAGNOSIS — D649 Anemia, unspecified: Secondary | ICD-10-CM

## 2020-11-10 NOTE — Addendum Note (Signed)
Addended by: Loren Racer on: 11/10/2020 10:18 AM   Modules accepted: Orders

## 2020-11-10 NOTE — Addendum Note (Signed)
Addended by: Loren Racer on: 11/10/2020 07:50 AM   Modules accepted: Orders

## 2020-11-11 ENCOUNTER — Encounter (HOSPITAL_COMMUNITY): Payer: Self-pay | Admitting: Internal Medicine

## 2020-11-11 ENCOUNTER — Inpatient Hospital Stay (HOSPITAL_COMMUNITY): Payer: HMO

## 2020-11-11 ENCOUNTER — Inpatient Hospital Stay (HOSPITAL_COMMUNITY)
Admission: AD | Admit: 2020-11-11 | Discharge: 2020-12-05 | DRG: 286 | Disposition: A | Discharge status: Home Health | Payer: HMO | Source: Ambulatory Visit | Attending: Internal Medicine | Admitting: Internal Medicine

## 2020-11-11 DIAGNOSIS — I444 Left anterior fascicular block: Secondary | ICD-10-CM | POA: Diagnosis present

## 2020-11-11 DIAGNOSIS — I2582 Chronic total occlusion of coronary artery: Secondary | ICD-10-CM | POA: Diagnosis present

## 2020-11-11 DIAGNOSIS — I130 Hypertensive heart and chronic kidney disease with heart failure and stage 1 through stage 4 chronic kidney disease, or unspecified chronic kidney disease: Secondary | ICD-10-CM | POA: Diagnosis not present

## 2020-11-11 DIAGNOSIS — E669 Obesity, unspecified: Secondary | ICD-10-CM | POA: Diagnosis present

## 2020-11-11 DIAGNOSIS — Z888 Allergy status to other drugs, medicaments and biological substances status: Secondary | ICD-10-CM

## 2020-11-11 DIAGNOSIS — E1169 Type 2 diabetes mellitus with other specified complication: Secondary | ICD-10-CM | POA: Diagnosis present

## 2020-11-11 DIAGNOSIS — R0609 Other forms of dyspnea: Secondary | ICD-10-CM | POA: Diagnosis present

## 2020-11-11 DIAGNOSIS — D631 Anemia in chronic kidney disease: Secondary | ICD-10-CM | POA: Diagnosis present

## 2020-11-11 DIAGNOSIS — I25119 Atherosclerotic heart disease of native coronary artery with unspecified angina pectoris: Secondary | ICD-10-CM | POA: Diagnosis present

## 2020-11-11 DIAGNOSIS — N170 Acute kidney failure with tubular necrosis: Secondary | ICD-10-CM | POA: Diagnosis not present

## 2020-11-11 DIAGNOSIS — I429 Cardiomyopathy, unspecified: Secondary | ICD-10-CM | POA: Diagnosis not present

## 2020-11-11 DIAGNOSIS — D509 Iron deficiency anemia, unspecified: Secondary | ICD-10-CM | POA: Diagnosis present

## 2020-11-11 DIAGNOSIS — E782 Mixed hyperlipidemia: Secondary | ICD-10-CM | POA: Diagnosis not present

## 2020-11-11 DIAGNOSIS — I252 Old myocardial infarction: Secondary | ICD-10-CM

## 2020-11-11 DIAGNOSIS — I5042 Chronic combined systolic (congestive) and diastolic (congestive) heart failure: Secondary | ICD-10-CM

## 2020-11-11 DIAGNOSIS — I255 Ischemic cardiomyopathy: Secondary | ICD-10-CM | POA: Diagnosis present

## 2020-11-11 DIAGNOSIS — N1832 Chronic kidney disease, stage 3b: Secondary | ICD-10-CM | POA: Diagnosis not present

## 2020-11-11 DIAGNOSIS — N179 Acute kidney failure, unspecified: Secondary | ICD-10-CM | POA: Diagnosis not present

## 2020-11-11 DIAGNOSIS — Z6825 Body mass index (BMI) 25.0-25.9, adult: Secondary | ICD-10-CM | POA: Diagnosis not present

## 2020-11-11 DIAGNOSIS — I2510 Atherosclerotic heart disease of native coronary artery without angina pectoris: Secondary | ICD-10-CM | POA: Diagnosis present

## 2020-11-11 DIAGNOSIS — Z955 Presence of coronary angioplasty implant and graft: Secondary | ICD-10-CM

## 2020-11-11 DIAGNOSIS — Z823 Family history of stroke: Secondary | ICD-10-CM

## 2020-11-11 DIAGNOSIS — E876 Hypokalemia: Secondary | ICD-10-CM | POA: Diagnosis not present

## 2020-11-11 DIAGNOSIS — E1122 Type 2 diabetes mellitus with diabetic chronic kidney disease: Secondary | ICD-10-CM | POA: Diagnosis present

## 2020-11-11 DIAGNOSIS — Z79899 Other long term (current) drug therapy: Secondary | ICD-10-CM

## 2020-11-11 DIAGNOSIS — R0602 Shortness of breath: Secondary | ICD-10-CM | POA: Diagnosis present

## 2020-11-11 DIAGNOSIS — Z20822 Contact with and (suspected) exposure to covid-19: Secondary | ICD-10-CM | POA: Diagnosis present

## 2020-11-11 DIAGNOSIS — K219 Gastro-esophageal reflux disease without esophagitis: Secondary | ICD-10-CM | POA: Diagnosis present

## 2020-11-11 DIAGNOSIS — Z8042 Family history of malignant neoplasm of prostate: Secondary | ICD-10-CM

## 2020-11-11 DIAGNOSIS — T508X5A Adverse effect of diagnostic agents, initial encounter: Secondary | ICD-10-CM | POA: Diagnosis present

## 2020-11-11 DIAGNOSIS — R0600 Dyspnea, unspecified: Secondary | ICD-10-CM | POA: Diagnosis not present

## 2020-11-11 DIAGNOSIS — N183 Chronic kidney disease, stage 3 unspecified: Secondary | ICD-10-CM | POA: Diagnosis present

## 2020-11-11 DIAGNOSIS — D500 Iron deficiency anemia secondary to blood loss (chronic): Secondary | ICD-10-CM | POA: Diagnosis present

## 2020-11-11 DIAGNOSIS — L409 Psoriasis, unspecified: Secondary | ICD-10-CM | POA: Diagnosis present

## 2020-11-11 DIAGNOSIS — E871 Hypo-osmolality and hyponatremia: Secondary | ICD-10-CM | POA: Diagnosis not present

## 2020-11-11 DIAGNOSIS — N141 Nephropathy induced by other drugs, medicaments and biological substances: Secondary | ICD-10-CM | POA: Diagnosis present

## 2020-11-11 DIAGNOSIS — I509 Heart failure, unspecified: Secondary | ICD-10-CM | POA: Diagnosis not present

## 2020-11-11 DIAGNOSIS — I5021 Acute systolic (congestive) heart failure: Secondary | ICD-10-CM | POA: Diagnosis not present

## 2020-11-11 DIAGNOSIS — Z7984 Long term (current) use of oral hypoglycemic drugs: Secondary | ICD-10-CM

## 2020-11-11 DIAGNOSIS — I5043 Acute on chronic combined systolic (congestive) and diastolic (congestive) heart failure: Secondary | ICD-10-CM | POA: Diagnosis not present

## 2020-11-11 DIAGNOSIS — D649 Anemia, unspecified: Secondary | ICD-10-CM | POA: Diagnosis not present

## 2020-11-11 DIAGNOSIS — E785 Hyperlipidemia, unspecified: Secondary | ICD-10-CM | POA: Diagnosis not present

## 2020-11-11 DIAGNOSIS — Z833 Family history of diabetes mellitus: Secondary | ICD-10-CM

## 2020-11-11 DIAGNOSIS — I5023 Acute on chronic systolic (congestive) heart failure: Secondary | ICD-10-CM | POA: Diagnosis not present

## 2020-11-11 DIAGNOSIS — Z7902 Long term (current) use of antithrombotics/antiplatelets: Secondary | ICD-10-CM

## 2020-11-11 DIAGNOSIS — I340 Nonrheumatic mitral (valve) insufficiency: Secondary | ICD-10-CM | POA: Diagnosis present

## 2020-11-11 DIAGNOSIS — Z8349 Family history of other endocrine, nutritional and metabolic diseases: Secondary | ICD-10-CM

## 2020-11-11 DIAGNOSIS — I517 Cardiomegaly: Secondary | ICD-10-CM

## 2020-11-11 DIAGNOSIS — Z8249 Family history of ischemic heart disease and other diseases of the circulatory system: Secondary | ICD-10-CM

## 2020-11-11 LAB — CBC WITH DIFFERENTIAL/PLATELET
Abs Immature Granulocytes: 0.02 10*3/uL (ref 0.00–0.07)
Basophils Absolute: 0 10*3/uL (ref 0.0–0.1)
Basophils Relative: 0 %
Eosinophils Absolute: 0.2 10*3/uL (ref 0.0–0.5)
Eosinophils Relative: 2 %
HCT: 30 % — ABNORMAL LOW (ref 39.0–52.0)
Hemoglobin: 8.9 g/dL — ABNORMAL LOW (ref 13.0–17.0)
Immature Granulocytes: 0 %
Lymphocytes Relative: 13 %
Lymphs Abs: 1 10*3/uL (ref 0.7–4.0)
MCH: 24.3 pg — ABNORMAL LOW (ref 26.0–34.0)
MCHC: 29.7 g/dL — ABNORMAL LOW (ref 30.0–36.0)
MCV: 81.7 fL (ref 80.0–100.0)
Monocytes Absolute: 0.4 10*3/uL (ref 0.1–1.0)
Monocytes Relative: 6 %
Neutro Abs: 5.9 10*3/uL (ref 1.7–7.7)
Neutrophils Relative %: 79 %
Platelets: 250 10*3/uL (ref 150–400)
RBC: 3.67 MIL/uL — ABNORMAL LOW (ref 4.22–5.81)
RDW: 19.5 % — ABNORMAL HIGH (ref 11.5–15.5)
WBC: 7.5 10*3/uL (ref 4.0–10.5)
nRBC: 0 % (ref 0.0–0.2)

## 2020-11-11 LAB — HEPATIC FUNCTION PANEL
ALT: 25 U/L (ref 0–44)
AST: 31 U/L (ref 15–41)
Albumin: 3.6 g/dL (ref 3.5–5.0)
Alkaline Phosphatase: 55 U/L (ref 38–126)
Bilirubin, Direct: 0.2 mg/dL (ref 0.0–0.2)
Indirect Bilirubin: 1.2 mg/dL — ABNORMAL HIGH (ref 0.3–0.9)
Total Bilirubin: 1.4 mg/dL — ABNORMAL HIGH (ref 0.3–1.2)
Total Protein: 7.1 g/dL (ref 6.5–8.1)

## 2020-11-11 LAB — CBC
Hematocrit: 26.3 % — ABNORMAL LOW (ref 37.5–51.0)
Hemoglobin: 7.8 g/dL — ABNORMAL LOW (ref 13.0–17.7)
MCH: 23.7 pg — ABNORMAL LOW (ref 26.6–33.0)
MCHC: 29.7 g/dL — ABNORMAL LOW (ref 31.5–35.7)
MCV: 80 fL (ref 79–97)
Platelets: 227 10*3/uL (ref 150–450)
RBC: 3.29 x10E6/uL — ABNORMAL LOW (ref 4.14–5.80)
RDW: 17 % — ABNORMAL HIGH (ref 11.6–15.4)
WBC: 6.2 10*3/uL (ref 3.4–10.8)

## 2020-11-11 LAB — IRON,TIBC AND FERRITIN PANEL
Ferritin: 126 ng/mL (ref 30–400)
Iron Saturation: 12 % — ABNORMAL LOW (ref 15–55)
Iron: 33 ug/dL — ABNORMAL LOW (ref 38–169)
Total Iron Binding Capacity: 283 ug/dL (ref 250–450)
UIBC: 250 ug/dL (ref 111–343)

## 2020-11-11 LAB — TSH: TSH: 3.685 u[IU]/mL (ref 0.350–4.500)

## 2020-11-11 LAB — FOLATE: Folate: 28.7 ng/mL (ref >5.9)

## 2020-11-11 LAB — GLUCOSE, CAPILLARY: Glucose-Capillary: 145 mg/dL — ABNORMAL HIGH (ref 70–99)

## 2020-11-11 LAB — HIV ANTIBODY (ROUTINE TESTING W REFLEX): HIV Screen 4th Generation wRfx: NONREACTIVE

## 2020-11-11 LAB — RETICULOCYTES
Immature Retic Fract: 27.8 % — ABNORMAL HIGH (ref 2.3–15.9)
RBC.: 3.58 MIL/uL — ABNORMAL LOW (ref 4.22–5.81)
Retic Count, Absolute: 111.7 10*3/uL (ref 19.0–186.0)
Retic Ct Pct: 3.1 % (ref 0.4–3.1)

## 2020-11-11 LAB — VITAMIN B12: Vitamin B-12: 605 pg/mL (ref 180–914)

## 2020-11-11 LAB — MAGNESIUM: Magnesium: 2 mg/dL (ref 1.7–2.4)

## 2020-11-11 LAB — BRAIN NATRIURETIC PEPTIDE: B Natriuretic Peptide: 1728.5 pg/mL — ABNORMAL HIGH (ref 0.0–100.0)

## 2020-11-11 MED ORDER — FUROSEMIDE 10 MG/ML IJ SOLN
20.0000 mg | Freq: Once | INTRAMUSCULAR | Status: AC
  Administered 2020-11-11: 20 mg via INTRAVENOUS
  Filled 2020-11-11: qty 2

## 2020-11-11 MED ORDER — OMEGA-3-ACID ETHYL ESTERS 1 G PO CAPS
1.0000 g | ORAL_CAPSULE | Freq: Every day | ORAL | Status: DC
  Administered 2020-11-12 – 2020-12-05 (×24): 1 g via ORAL
  Filled 2020-11-11 (×25): qty 1

## 2020-11-11 MED ORDER — FUROSEMIDE 20 MG PO TABS: 20.0000 mg | ORAL_TABLET | ORAL | Status: DC

## 2020-11-11 MED ORDER — FUROSEMIDE 10 MG/ML IJ SOLN: 40.0000 mg | Freq: Once | INTRAMUSCULAR | Status: DC

## 2020-11-11 MED ORDER — SODIUM CHLORIDE 0.9% FLUSH: 3.0000 mL | INTRAVENOUS | Status: DC | PRN

## 2020-11-11 MED ORDER — CARVEDILOL 25 MG PO TABS
25.0000 mg | ORAL_TABLET | Freq: Two times a day (BID) | ORAL | Status: DC
  Administered 2020-11-12: 12.5 mg via ORAL
  Filled 2020-11-11 (×2): qty 1

## 2020-11-11 MED ORDER — INSULIN ASPART 100 UNIT/ML ~~LOC~~ SOLN
0.0000 [IU] | Freq: Three times a day (TID) | SUBCUTANEOUS | Status: DC
  Administered 2020-11-12: 2 [IU] via SUBCUTANEOUS
  Administered 2020-11-13: 3 [IU] via SUBCUTANEOUS
  Administered 2020-11-13: 5 [IU] via SUBCUTANEOUS
  Administered 2020-11-14: 3 [IU] via SUBCUTANEOUS
  Administered 2020-11-14 – 2020-11-15 (×2): 8 [IU] via SUBCUTANEOUS
  Administered 2020-11-15 – 2020-11-16 (×3): 3 [IU] via SUBCUTANEOUS
  Administered 2020-11-16: 5 [IU] via SUBCUTANEOUS
  Administered 2020-11-16: 3 [IU] via SUBCUTANEOUS
  Administered 2020-11-17: 5 [IU] via SUBCUTANEOUS
  Administered 2020-11-17: 3 [IU] via SUBCUTANEOUS
  Administered 2020-11-17: 5 [IU] via SUBCUTANEOUS
  Administered 2020-11-18: 2 [IU] via SUBCUTANEOUS
  Administered 2020-11-18: 3 [IU] via SUBCUTANEOUS
  Administered 2020-11-18 (×2): 5 [IU] via SUBCUTANEOUS
  Administered 2020-11-19: 3 [IU] via SUBCUTANEOUS
  Administered 2020-11-19 (×2): 8 [IU] via SUBCUTANEOUS
  Administered 2020-11-19: 5 [IU] via SUBCUTANEOUS
  Administered 2020-11-20: 3 [IU] via SUBCUTANEOUS
  Administered 2020-11-20: 2 [IU] via SUBCUTANEOUS
  Administered 2020-11-20: 3 [IU] via SUBCUTANEOUS
  Administered 2020-11-20: 8 [IU] via SUBCUTANEOUS
  Administered 2020-11-21: 5 [IU] via SUBCUTANEOUS
  Administered 2020-11-21: 2 [IU] via SUBCUTANEOUS
  Administered 2020-11-21: 3 [IU] via SUBCUTANEOUS
  Administered 2020-11-22: 8 [IU] via SUBCUTANEOUS
  Administered 2020-11-22: 2 [IU] via SUBCUTANEOUS
  Administered 2020-11-22: 5 [IU] via SUBCUTANEOUS
  Administered 2020-11-22: 3 [IU] via SUBCUTANEOUS
  Administered 2020-11-23: 5 [IU] via SUBCUTANEOUS
  Administered 2020-11-23: 3 [IU] via SUBCUTANEOUS
  Administered 2020-11-23: 2 [IU] via SUBCUTANEOUS
  Administered 2020-11-23: 8 [IU] via SUBCUTANEOUS
  Administered 2020-11-24: 5 [IU] via SUBCUTANEOUS
  Administered 2020-11-24: 2 [IU] via SUBCUTANEOUS
  Administered 2020-11-24: 3 [IU] via SUBCUTANEOUS
  Administered 2020-11-24: 11 [IU] via SUBCUTANEOUS
  Administered 2020-11-25: 5 [IU] via SUBCUTANEOUS
  Administered 2020-11-25 (×2): 3 [IU] via SUBCUTANEOUS
  Administered 2020-11-25: 8 [IU] via SUBCUTANEOUS
  Administered 2020-11-26: 11 [IU] via SUBCUTANEOUS
  Administered 2020-11-26 – 2020-11-27 (×4): 3 [IU] via SUBCUTANEOUS
  Administered 2020-11-27: 2 [IU] via SUBCUTANEOUS
  Administered 2020-11-27 – 2020-11-28 (×3): 5 [IU] via SUBCUTANEOUS
  Administered 2020-11-28: 3 [IU] via SUBCUTANEOUS
  Administered 2020-11-28: 2 [IU] via SUBCUTANEOUS
  Administered 2020-11-28: 5 [IU] via SUBCUTANEOUS
  Administered 2020-11-29: 2 [IU] via SUBCUTANEOUS
  Administered 2020-11-29 – 2020-11-30 (×5): 5 [IU] via SUBCUTANEOUS
  Administered 2020-12-01: 3 [IU] via SUBCUTANEOUS
  Administered 2020-12-01: 5 [IU] via SUBCUTANEOUS
  Administered 2020-12-01: 2 [IU] via SUBCUTANEOUS
  Administered 2020-12-01 (×2): 3 [IU] via SUBCUTANEOUS
  Administered 2020-12-02 (×2): 5 [IU] via SUBCUTANEOUS
  Administered 2020-12-02: 3 [IU] via SUBCUTANEOUS
  Administered 2020-12-02: 5 [IU] via SUBCUTANEOUS
  Administered 2020-12-03 (×2): 3 [IU] via SUBCUTANEOUS
  Administered 2020-12-03: 8 [IU] via SUBCUTANEOUS
  Administered 2020-12-03 – 2020-12-04 (×2): 3 [IU] via SUBCUTANEOUS
  Administered 2020-12-04: 8 [IU] via SUBCUTANEOUS
  Administered 2020-12-04: 2 [IU] via SUBCUTANEOUS
  Administered 2020-12-04: 5 [IU] via SUBCUTANEOUS
  Administered 2020-12-05: 2 [IU] via SUBCUTANEOUS
  Administered 2020-12-05: 5 [IU] via SUBCUTANEOUS

## 2020-11-11 MED ORDER — SACUBITRIL-VALSARTAN 49-51 MG PO TABS
1.0000 | ORAL_TABLET | Freq: Two times a day (BID) | ORAL | Status: DC
  Administered 2020-11-12: 1 via ORAL
  Administered 2020-11-12: 0.5 via ORAL
  Filled 2020-11-11 (×2): qty 1

## 2020-11-11 MED ORDER — ATORVASTATIN CALCIUM 40 MG PO TABS
40.0000 mg | ORAL_TABLET | Freq: Every day | ORAL | Status: DC
  Administered 2020-11-12 – 2020-12-05 (×24): 40 mg via ORAL
  Filled 2020-11-11 (×24): qty 1

## 2020-11-11 MED ORDER — ONDANSETRON HCL 4 MG/2ML IJ SOLN: 4.0000 mg | Freq: Four times a day (QID) | INTRAMUSCULAR | Status: DC | PRN

## 2020-11-11 MED ORDER — ADULT MULTIVITAMIN W/MINERALS CH
1.0000 | ORAL_TABLET | Freq: Every day | ORAL | Status: DC
  Administered 2020-11-12 – 2020-12-05 (×24): 1 via ORAL
  Filled 2020-11-11 (×24): qty 1

## 2020-11-11 MED ORDER — EZETIMIBE 10 MG PO TABS
10.0000 mg | ORAL_TABLET | Freq: Every day | ORAL | Status: DC
  Administered 2020-11-12 – 2020-12-05 (×24): 10 mg via ORAL
  Filled 2020-11-11 (×24): qty 1

## 2020-11-11 MED ORDER — ACETAMINOPHEN 325 MG PO TABS: 650.0000 mg | ORAL_TABLET | ORAL | Status: DC | PRN

## 2020-11-11 MED ORDER — PANTOPRAZOLE SODIUM 40 MG PO TBEC
40.0000 mg | DELAYED_RELEASE_TABLET | Freq: Every day | ORAL | Status: DC
  Administered 2020-11-12 – 2020-12-05 (×24): 40 mg via ORAL
  Filled 2020-11-11 (×24): qty 1

## 2020-11-11 MED ORDER — SODIUM CHLORIDE 0.9 % IV SOLN: 250.0000 mL | INTRAVENOUS | Status: DC | PRN

## 2020-11-11 MED ORDER — SODIUM CHLORIDE 0.9% FLUSH
3.0000 mL | Freq: Two times a day (BID) | INTRAVENOUS | Status: DC
  Administered 2020-11-11 – 2020-11-21 (×13): 3 mL via INTRAVENOUS

## 2020-11-11 NOTE — H&P (Signed)
Date of admission: 11/11/2020   Chief Complaint:    Shortness of breath and anemia  History of Present Illness:     Patient is a 70 year old male with a hx of diabetes mellitus type 2, coronary artery disease with prior large anterior infarction, chronic combined systolic and diastolic heart failure, hypertension, hyperlipidemia, CKD stage III, mild obesity, severe progressive anemia with indices and iron studies suggesting iron deficiency.  Patient was started on iron replacement but his hemoglobin has decreased from 8.4=>7.8.  Patient states that he feels more tired and winded for the last 2 weeks.  Denies any chest pain.  Has 2 pillow orthopnea that has not changed from before.  States that he had a colonoscopy about 10 years ago that was negative.  He also had a fecal occult test that was negative for occult blood.  He had an endoscopy about 4 weeks ago.  Been having throat irritation secondary to that.  He denies any chest pains.  Past Medical History:  Diagnosis Date  . Cardiomyopathy ([place])    Left ventricle dysfunction with LVEF 35-40% by echo March 2009  . Chest pain   . Coronary atherosclerosis of native coronary artery    with large anterior myocardial infarction 1997. PTCA LAD. Distal circumflex and first diagonal DES 2011  . Diabetes ([place])   . Heart failure ([place])   . HTN (hypertension)   . Hypercholesteremia   . Hypercholesteremia   . Hyperlipidemia   . Hyperlipidemia   . Psoriasis   . Sciatica     Past Surgical History:  Procedure Laterality Date  . CORONARY ANGIOPLASTY WITH STENT PLACEMENT     x 2 Dr. Tamala Julian     Allergy:    Allergies  Allergen Reactions  . Candesartan Cilexetil Itching    Other reaction(s): itching, tingling in extrmeities  . Spironolactone     Other reaction(s): GI Upset (intolerance), gynecomastia    Prior to Admission medications   Medication Sig Start Date End Date Taking? Authorizing Provider  atorvastatin (LIPITOR) 40 MG tablet TAKE 1  TABLET BY MOUTH EVERY DAY 08/29/20   Belva Crome, MD  betamethasone dipropionate 0.05 % cream Apply topically See admin instructions. 09/10/19   [provider]  calcium carbonate (TUMS - DOSED IN MG ELEMENTAL CALCIUM) 500 MG chewable tablet Chew 1 tablet by mouth as needed for heartburn.    [provider]  carvedilol (COREG) 25 MG tablet Take 1 tablet (25 mg total) by mouth 2 (two) times daily with a meal. 01/02/16   Belva Crome, MD  clopidogrel (PLAVIX) 75 MG tablet TAKE 1 TABLET BY MOUTH EVERY DAY 12/31/19   Belva Crome, MD  diclofenac sodium (VOLTAREN) 1 % GEL Apply 2 g topically 4 (four) times daily. PRN knee pain    [provider]  ENTRESTO 49-51 MG TAKE 1 TABLET BY MOUTH TWICE A DAY 05/19/20   Belva Crome, MD  ezetimibe (ZETIA) 10 MG tablet TAKE 1 TABLET BY MOUTH EVERY DAY 08/29/20   Belva Crome, MD  fish oil-omega-3 fatty acids 1000 MG capsule Take 3 g by mouth daily.    [provider]  furosemide (LASIX) 20 MG tablet Take 1 tablet (20 mg total) by mouth every Monday, Wednesday, and Friday. 10/25/20   Belva Crome, MD  glimepiride (AMARYL) 4 MG tablet Take 4 mg by mouth daily. 04/29/15   [provider]  metFORMIN (GLUCOPHAGE) 500 MG tablet Take 1,000 mg by mouth 2 (  two) times daily with a meal.     [provider]  Multiple Vitamin (MULTIVITAMIN) capsule Take 1 capsule by mouth daily.    [provider]  nitroGLYCERIN (NITROSTAT) 0.4 MG SL tablet PLACE 1 TABLET UNDER THE TONGUE EVERY 5 MINUTES AS NEEDED FOR CHEST PAIN 04/21/20   Belva Crome, MD  pantoprazole (PROTONIX) 40 MG tablet Take 40 mg by mouth in the morning and at bedtime. 09/03/20   [provider]  selenium sulfide (SELSUN) 2.5 % shampoo Apply 1 application topically daily as needed for itching.     [provider]    Family History  Problem Relation Age of Onset  . Arrhythmia Mother   . Diabetes Mother   . Heart failure Mother   .  Hypertension Mother   . Prostate cancer Father   . Stroke Father   . Thyroid disease Brother   . Healthy Sister   . Healthy Brother   . Prostate cancer Brother     Social History   Socioeconomic History  . Marital status: Married    Spouse name: Not on file  . Number of children: Not on file  . Years of education: Not on file  . Highest education level: Not on file  Occupational History  . Not on file  Tobacco Use  . Smoking status: Never Smoker  . Smokeless tobacco: Never Used  Vaping Use  . Vaping Use: Never used  Substance and Sexual Activity  . Alcohol use: Yes    Alcohol/week: 0.0 standard drinks    Comment: occasional  . Drug use: No  . Sexual activity: Not on file  Other Topics Concern  . Not on file  Social History Narrative  . Not on file   Social Determinants of Health   Financial Resource Strain: Not on file  Food Insecurity: Not on file  Transportation Needs: No Transportation Needs  . Lack of Transportation (Medical): No  . Lack of Transportation (Non-Medical): No  Physical Activity: Not on file  Stress: Not on file  Social Connections: Not on file    Review of Systems: General Health: Denies fever/chills, night sweats, weight changes, has exercise intolerance  HEENT: Denies headache, changes in vision, diploplia, changes in hearing, tinnitus, vertigo, epistaxis, sore throat  Respiratory: Has dyspnea,Denies pleuritic pain, cough, wheezing, hemoptysis, cyanosis, snoring/apnea  Cardiovascular: Has DOE, orthopnea, edema, palpitations, no claudication, leg cramps  Gastrointestinal: Denies changes in appetite, odynophagia, dysphagia, heartburn, nausea/vomiting, hematemesis, jaundice, abdominal pain, hematochezia, melena, diarrhea, constipation  Genitourinary: Denies dysuria, nocturia, hematuria, frequency, hesitancy, urgency, urinary incontinence, urethral discharge, dyspareunia  Neurologic: Denies dizziness, syncope, coordination loss, paralysis, motor  weakness, seizures, paresthesias  Extremities: Has edema of the legs Endocrine: Denies polyuria, polyphagia, heat/cold intolerance  Psych: Denies depression, sadness, sleep disturbances, anorexia, anhedonia, anxiety   Physical Examination:     Today's Vitals   11/11/20 1940  BP: 115/76  Pulse: (!) 106  Resp: 18  Temp: 97.6 F (36.4 C)  TempSrc: Oral  SpO2: 100%  Weight: 76.7 kg  Height: '5\' 7"'$  (1.702 m)   Body mass index is 26.47 kg/m.   Gen - Patient is awake, alert, oriented to place, person and date, comfortable, not in acute distress, moderately built, well nourished Eyes - PERLA, EOMs intact, pinkish conjunctiva HENT- Head atraumatic, No lymphadenopathy, No JVD, JVP within normal limit, No thyromegaly, supple neck, no carotid bruit   Resp - chest symmetrical, BS CTA bilaterally; no rales/rhonchi/wheezes auscultated CV- regular rhythm, normal rate; no  murmurs/clicks/gallops/rubs auscultated; pulses 2 + GI - soft, NT/ND, BS+; no hepatosplenomegaly, no masses palpated Musculosk - no limitation of motion, no joint swelling or redness Neuro - no focal abnormalities Integumentary - No rash  Laboratory data:      Lab Results  Component Value Date/Time   WBC 6.2 11/10/2020 12:55 PM   WBC 6.8 10/27/2009 05:10 AM   HGB 7.8 (L) 11/10/2020 12:55 PM   HCT 26.3 (L) 11/10/2020 12:55 PM   MCV 80 11/10/2020 12:55 PM   PLT 227 11/10/2020 12:55 PM   Lab Results  Component Value Date/Time   NA 137 11/07/2020 10:16 AM   K 4.1 11/07/2020 10:16 AM   CL 100 11/07/2020 10:16 AM   CO2 20 11/07/2020 10:16 AM   CREATININE 1.69 (H) 11/07/2020 10:16 AM   BUN 24 11/07/2020 10:16 AM   CBC    Component Value Date/Time   WBC 6.2 11/10/2020 1255   WBC 6.8 10/27/2009 0510   RBC 3.29 (L) 11/10/2020 1255   RBC 4.48 10/27/2009 0510   HGB 7.8 (L) 11/10/2020 1255   HCT 26.3 (L) 11/10/2020 1255   PLT 227 11/10/2020 1255   MCV 80 11/10/2020 1255   MCH 23.7 (L) 11/10/2020 1255   MCHC 29.7  (L) 11/10/2020 1255   MCHC 33.7 10/27/2009 0510   RDW 17.0 (H) 11/10/2020 1255    Lab Results  Component Value Date   ALT 33 10/24/2020   AST 29 10/24/2020   ALKPHOS 64 10/24/2020   BILITOT 0.8 10/24/2020   No results found for: TSH  Imaging Studies:       Chest x Ray:  Cardiology Studies:     Telemetry: Sinus rhythm with tachycardia.      Patient Active Problem List   Diagnosis Date Noted  . CKD stage 3 secondary to diabetes ([place]) 07/19/2015  . Chronic combined systolic and diastolic CHF, NYHA class 2 ([place])   . Cardiomyopathy ([place])   . Hyperlipidemia   . Coronary artery disease involving native coronary artery of native heart with angina pectoris ([place])   . HTN (hypertension)   . Diabetes ([place])   . Sciatica   . Chest pain   . Psoriasis     Assessment and plan: Congestive heart failure, ACC-C, NYHA class III: Patient has distant heart failure that is decompensated from his anemia.  Will consult hospitalist team to help manage his anemia.  Blood replacement would be decided by the hospitalist to avoid spurious errors and the blood work.  I will continue the current medication for the patient and would not increase his Coreg because the heart rate appears to be compensated 3 secondary to the anemia.  Will give a as needed dose of Lasix.  Iron deficiency anemia: We will have hospitalist to evaluate and manage.  Coronary artery disease and hypertension: Stable.  Continue the current medications.  Admission, diagnosis, medications and treatment plan discussed with patient and he agreed and verbalized understanding, all questions answered.  Lynann Beaver. Juliane Lack, MD 11/11/2020 7:44 PM

## 2020-11-11 NOTE — Consult Note (Addendum)
Medical Consultation   Marc [name]  [id number]  DOB: Aug 13, 1951  DOA: 11/11/2020  PCP: Lawerance Cruel, MD    Requesting physician: Dr. Sandre Kitty with Cardiology   Reason for consultation:  Anemia, concern for blood loss anemia   History of Present Illness:  70 year old male with past medical history of non-insulin-dependent diabetes mellitus type 2, coronary artery disease (S/P MI 1997, S/P cath 2011 with 2 stents), ischemic cardiomyopathy with systolic and diastolic congestive heart failure (last EF 40% in 2014), chronic kidney disease stage III, hypertension, hyperlipidemia and gastroesophageal reflux disease who presents to [facility] as a direct admission to the cardiac unit under the care of Dr. Daneen Schick due to progressive weakness and dyspnea on exertion with concerns for progressive anemia.  Patient explains for the past several months he has been experiencing ongoing feelings of heartburn, refractory to initiation of a daily PPI several months ago.  In early February, patient was evaluated by gastroenterology and promptly scheduled for an upper endoscopy on 2/11 which revealed no discernible cause of the patient's ongoing symptoms.  Despite this negative work-up, by the end of February patient began to experience generalized weakness.  This generalized weakness was initially mild in intensity but progressively began to worsen in the weeks that followed and eventually became severe in intensity, worse with exertion and improved with rest.  Over the span of time patient also began to develop shortness of breath, initially primarily with exertion but also occurring when the patient lies flat.  Patient also endorses pillow orthopnea and paroxysmal nocturnal dyspnea over the span of time.  Patient is also been complaining of associated bilateral lower extremity pitting edema and increasing abdominal girth over this span of time.  Patient also  states that he has been experiencing ongoing cough, intermittently productive with clear sputum.  Patient denies associated chest pain, fevers, sick contacts, recent travel or confirmed contact with COVID-19 infection.  Of note, patient has received both his Covid vaccine and booster.  As the patient is followed up as an outpatient with his cardiologist and primary care provider there has been concern about patient's progressive anemia.  Patient was noted to have a hemoglobin of 11.53 in early 2021.  Patient was then found to have a hemoglobin 8.9 on in mid March, followed by a repeat hemoglobin of 7.8 on 3/25.  Patient denies abdominal pain, hematemesis, blood in the stool.  Patient does state that in the past several weeks he began to exhibit black stools but this was only after he was started on outpatient oral iron supplementation.  Patient has since had stool Hemoccult testing and was found to be negative per my review of outpatient notes.  Due to progressively worsening symptoms of fatigue and dyspnea on exertion with concern for anemia patient was directly admitted to the medical floor at [facility] health under the care of Dr. Tamala Julian.  Hospital medicine was consulted for assistance in working up the patient's anemia and progressive fatigue.  Review of Systems:   Review of Systems  Constitutional: Positive for malaise/fatigue.  Respiratory: Positive for cough, sputum production and shortness of breath.   Cardiovascular: Positive for orthopnea, leg swelling and PND.  Neurological: Positive for weakness.  All other systems reviewed and are negative.     Past Medical History: Past Medical History:  Diagnosis Date  . Cardiomyopathy ([place])    Left ventricle dysfunction with LVEF 35-40%  by echo March 2009  . Chest pain   . Coronary atherosclerosis of native coronary artery    with large anterior myocardial infarction 1997. PTCA LAD. Distal circumflex and first diagonal DES 2011  . Diabetes ([place])    . Heart failure ([place])   . HTN (hypertension)   . Hypercholesteremia   . Hypercholesteremia   . Hyperlipidemia   . Hyperlipidemia   . Psoriasis   . Sciatica     Past Surgical History: Past Surgical History:  Procedure Laterality Date  . CORONARY ANGIOPLASTY WITH STENT PLACEMENT     x 2 Dr. Tamala Julian     Allergies:   Allergies  Allergen Reactions  . Candesartan Cilexetil Itching    Other reaction(s): itching, tingling in extrmeities  . Spironolactone     Other reaction(s): GI Upset (intolerance), gynecomastia     Social History:  reports that he has never smoked. He has never used smokeless tobacco. He reports current alcohol use. He reports that he does not use drugs.   Family History: Family History  Problem Relation Age of Onset  . Arrhythmia Mother   . Diabetes Mother   . Heart failure Mother   . Hypertension Mother   . Prostate cancer Father   . Stroke Father   . Thyroid disease Brother   . Healthy Sister   . Healthy Brother   . Prostate cancer Brother        Physical Exam: Vitals:   11/11/20 1940  BP: 115/76  Pulse: (!) 106  Resp: 18  Temp: 97.6 F (36.4 C)  TempSrc: Oral  SpO2: 100%  Weight: 76.7 kg  Height: '5\' 7"'$  (1.702 m)    Constitutional: Acute alert and oriented x3, no associated distress.   Skin: no rashes, no lesions, good skin turgor noted. Eyes: Pupils are equally reactive to light.  No evidence of scleral icterus or conjunctival pallor.  ENMT: Mucous membranes are moist. Posterior pharynx clear of any exudate or lesions. Normal dentition.   Neck: Markedly elevated jugular venous pulse at 45 degrees.  Notable jugular venous distention when patient lies flat.  Neck is supple, no masses, no thyromegaly Respiratory: Very faint rales in the bilateral lower fields.  No evidence of concurrent wheezing.  Normal respiratory effort. No accessory muscle use.  Cardiovascular: Regular rate and rhythm, no murmurs / rubs / gallops.  Notable  bilateral lower extremity pitting edema that tracks from the distal lower extremities up to midway up the shins.. 2+ pedal pulses. No carotid bruits.  Back:   Nontender without crepitus or deformity. Abdomen: Abdomen is soft and nontender.  No evidence of intra-abdominal masses.  Positive bowel sounds noted in all quadrants.   Musculoskeletal: No joint deformity upper and lower extremities. Good ROM, no contractures. Normal muscle tone.  Neurologic: CN 2-12 grossly intact. Sensation intact, strength noted to be 5 out of 5 in all 4 extremities.  Patient is following all commands.  Patient is responsive to verbal stimuli.   Psychiatric: Patient presents as a normal mood with appropriate affect.  Patient seems to possess insight as to theircurrent situation.      Data reviewed:  I have personally reviewed following labs and imaging studies Labs:  CBC: Recent Labs  Lab 11/10/20 1255 11/11/20 2151  WBC 6.2 7.5  NEUTROABS  --  5.9  HGB 7.8* 8.9*  HCT 26.3* 30.0*  MCV 80 81.7  PLT 227 [id number]    Basic Metabolic Panel: Recent Labs  Lab 11/07/20 1016  NA 137  K 4.1  CL 100  CO2 20  GLUCOSE 150*  BUN 24  CREATININE 1.69*  CALCIUM 9.2   GFR Estimated Creatinine Clearance: 38.6 mL/min (A) (by C-G formula based on SCr of 1.69 mg/dL (H)). Liver Function Tests: No results for input(s): AST, ALT, ALKPHOS, BILITOT, PROT, ALBUMIN in the last 168 hours. No results for input(s): LIPASE, AMYLASE in the last 168 hours. No results for input(s): AMMONIA in the last 168 hours. Coagulation profile No results for input(s): INR, PROTIME in the last 168 hours.  Cardiac Enzymes: No results for input(s): CKTOTAL, CKMB, CKMBINDEX, TROPONINI in the last 168 hours. BNP: Invalid input(s): POCBNP CBG: Recent Labs  Lab 11/11/20 2010  GLUCAP 145*   D-Dimer No results for input(s): DDIMER in the last 72 hours. Hgb A1c No results for input(s): HGBA1C in the last 72 hours. Lipid Profile No results  for input(s): CHOL, HDL, LDLCALC, TRIG, CHOLHDL, LDLDIRECT in the last 72 hours. Thyroid function studies No results for input(s): TSH, T4TOTAL, T3FREE, THYROIDAB in the last 72 hours.  Invalid input(s): FREET3 Anemia work up Recent Labs    11/10/20 1255  FERRITIN 126  TIBC 283  IRON 33*   Urinalysis No results found for: COLORURINE, APPEARANCEUR, LABSPEC, [place], GLUCOSEU, HGBUR, BILIRUBINUR, KETONESUR, PROTEINUR, UROBILINOGEN, NITRITE, [place]   Microbiology No results found for this or any previous visit (from the past 240 hour(s)).     Inpatient Medications:   Scheduled Meds: . furosemide  40 mg Intravenous Once  . sodium chloride flush  3 mL Intravenous Q12H   Continuous Infusions: . sodium chloride     Bedside Telemetry: Personally reviewed, sinus tachycardia 100 bpm.  Radiological Exams on Admission: No results found.  Impression/Recommendations Principal Problem:   Dyspnea on exertion   Patient complaining of a 1 and half month history of progressive dyspnea on exertion with additional concerning symptoms such as paroxysmal nocturnal dyspnea, pillow orthopnea, worsening peripheral edema and increasing abdominal girth.  While patient denies any hematemesis or blood in the stool patient additionally has been found to have iron deficiency anemia with progressively decreasing hemoglobin in the past several weeks from 11.531-year ago to 8.4 several weeks ago now down to 7.8.  Of note, proBNP performed on 3/8 was found to be 1685.  I do believe patient's symptoms are multifactorial in origin.  There is clinical evidence of cardiogenic volume overload on examination and I believe the patient would benefit from a course of intravenous diuretics as blood pressure tolerates.  Considering patient's chronic kidney disease with baseline creatinine of approximately 1.5-1.6, patient may benefit from a moderate dose of intravenous Lasix such as 40 mg or  higher.  Furthermore, decreasing oxygen carrying capacity due to drop in hemoglobin is certainly exacerbating patient symptoms.  Patient did undergo upper endoscopy on 2/11 revealing no evidence of sources of bleeding in the esophagus stomach or duodenum.  As patient is being provided with intravenous diuretics (I see first dose has been ordered by Cardiology) we will work-up patient's anemia further with iron panel (done yesterday), folate, vitamin B12, type and screen, hepatic function panel, reticulocyte count and serial CBCs throughout the evening.  Stool Hemoccult performed recently in the outpatient setting was negative although these tests are frequently unreliable.  Continue to hold patient's home regimen of antiplatelet therapy (Plavix) in light of possible gastrointestinal bleeding.  Additionally obtaining Covid PCR testing, TSH and chest x-ray  If hemoglobin does indeed downtrend suggesting of progressive blood loss anemia we will send a  message to gastroenterology and requested nonurgent consultation in the morning to get their opinion on whether we can complete patient endoscopic work-up with a colonoscopy during this hospitalization.  If Hgb drops below 7 we will proceed with PRBC transfusion - patient has already given consent.   Active Problems:   Acute on chronic combined systolic and diastolic CHF (congestive heart failure) (HCC)   Continue home regimen of Entresto  Please see assessment and plan above    Coronary artery disease involving native coronary artery of native heart without angina pectoris  . Patient is currently chest pain free . Obtaining baseline EKG . Monitoring patient on telemetry . Continue home regimen of antiplatelet therapy, lipid lowering therapy and AV nodal blocking therapy    Type 2 diabetes mellitus with stage 3 chronic kidney disease, without long-term current use of insulin ([place])  . Patient been placed on Accu-Cheks before every meal and  nightly with sliding scale insulin . Holding home regimen of hypoglycemics . Hemoglobin A1C ordered . Diabetic Diet    Chronic kidney disease, stage 3b ([place])  . Strict intake and output monitoring . Obtain a chemistry to evaluate renal function . Minimizing nephrotoxic agents as much as possible . Serial chemistries to monitor renal function and electrolytes    Mixed hyperlipidemia due to type 2 diabetes mellitus ([place])  . Continuing home regimen of lipid lowering therapy.    GERD without esophagitis  . Continuing home regimen of daily PPI therapy.    Iron (Fe) deficiency anemia   Please see assessment and plan above   Thank you for this consultation.  Our [facility] hospitalist team will follow the patient with you.   Time Spent:  95 min  Vernelle Emerald M.D. [facility] 11/11/2020, 10:08 PM

## 2020-11-12 ENCOUNTER — Inpatient Hospital Stay (HOSPITAL_COMMUNITY): Payer: HMO

## 2020-11-12 DIAGNOSIS — E782 Mixed hyperlipidemia: Secondary | ICD-10-CM

## 2020-11-12 DIAGNOSIS — E1169 Type 2 diabetes mellitus with other specified complication: Secondary | ICD-10-CM

## 2020-11-12 DIAGNOSIS — I2510 Atherosclerotic heart disease of native coronary artery without angina pectoris: Secondary | ICD-10-CM | POA: Diagnosis not present

## 2020-11-12 DIAGNOSIS — D509 Iron deficiency anemia, unspecified: Secondary | ICD-10-CM

## 2020-11-12 DIAGNOSIS — N1832 Chronic kidney disease, stage 3b: Secondary | ICD-10-CM

## 2020-11-12 DIAGNOSIS — E1122 Type 2 diabetes mellitus with diabetic chronic kidney disease: Secondary | ICD-10-CM

## 2020-11-12 DIAGNOSIS — I5021 Acute systolic (congestive) heart failure: Secondary | ICD-10-CM | POA: Diagnosis not present

## 2020-11-12 DIAGNOSIS — I429 Cardiomyopathy, unspecified: Secondary | ICD-10-CM

## 2020-11-12 DIAGNOSIS — N179 Acute kidney failure, unspecified: Secondary | ICD-10-CM

## 2020-11-12 DIAGNOSIS — I5043 Acute on chronic combined systolic (congestive) and diastolic (congestive) heart failure: Secondary | ICD-10-CM

## 2020-11-12 LAB — APTT: aPTT: 33 seconds (ref 24–36)

## 2020-11-12 LAB — ECHOCARDIOGRAM COMPLETE
Calc EF: 23.1 %
Height: 67 in
MV M vel: 3.76 m/s
MV Peak grad: 56.6 mmHg
Radius: 0.4 cm
S' Lateral: 4.9 cm
Single Plane A2C EF: 30.4 %
Single Plane A4C EF: 14.1 %
Weight: 2680.79 oz

## 2020-11-12 LAB — CBC
HCT: 26.9 % — ABNORMAL LOW (ref 39.0–52.0)
HCT: 26.2 % — ABNORMAL LOW (ref 39.0–52.0)
Hemoglobin: 8.6 g/dL — ABNORMAL LOW (ref 13.0–17.0)
Hemoglobin: 8 g/dL — ABNORMAL LOW (ref 13.0–17.0)
MCH: 25.7 pg — ABNORMAL LOW (ref 26.0–34.0)
MCH: 24.5 pg — ABNORMAL LOW (ref 26.0–34.0)
MCHC: 32 g/dL (ref 30.0–36.0)
MCHC: 30.5 g/dL (ref 30.0–36.0)
MCV: 80.3 fL (ref 80.0–100.0)
MCV: 80.4 fL (ref 80.0–100.0)
Platelets: 284 10*3/uL (ref 150–400)
Platelets: 233 10*3/uL (ref 150–400)
RBC: 3.35 MIL/uL — ABNORMAL LOW (ref 4.22–5.81)
RBC: 3.26 MIL/uL — ABNORMAL LOW (ref 4.22–5.81)
RDW: 19.4 % — ABNORMAL HIGH (ref 11.5–15.5)
RDW: 19.3 % — ABNORMAL HIGH (ref 11.5–15.5)
WBC: 7.9 10*3/uL (ref 4.0–10.5)
WBC: 6.4 10*3/uL (ref 4.0–10.5)
nRBC: 0 % (ref 0.0–0.2)
nRBC: 0 % (ref 0.0–0.2)

## 2020-11-12 LAB — ABO/RH: ABO/RH(D): AB POS

## 2020-11-12 LAB — GLUCOSE, CAPILLARY
Glucose-Capillary: 84 mg/dL (ref 70–99)
Glucose-Capillary: 145 mg/dL — ABNORMAL HIGH (ref 70–99)
Glucose-Capillary: 126 mg/dL — ABNORMAL HIGH (ref 70–99)
Glucose-Capillary: 140 mg/dL — ABNORMAL HIGH (ref 70–99)

## 2020-11-12 LAB — BASIC METABOLIC PANEL
Anion gap: 9 (ref 5–15)
BUN: 28 mg/dL — ABNORMAL HIGH (ref 8–23)
CO2: 27 mmol/L (ref 22–32)
Calcium: 9.4 mg/dL (ref 8.9–10.3)
Chloride: 101 mmol/L (ref 98–111)
Creatinine, Ser: 1.76 mg/dL — ABNORMAL HIGH (ref 0.61–1.24)
GFR, Estimated: 41 mL/min — ABNORMAL LOW (ref >60)
Glucose, Bld: 95 mg/dL (ref 70–99)
Potassium: 4 mmol/L (ref 3.5–5.1)
Sodium: 137 mmol/L (ref 135–145)

## 2020-11-12 LAB — HEMOGLOBIN A1C
Hgb A1c MFr Bld: 5.7 % — ABNORMAL HIGH (ref 4.8–5.6)
Mean Plasma Glucose: 116.89 mg/dL

## 2020-11-12 LAB — PROTIME-INR
INR: 1.2 (ref 0.8–1.2)
Prothrombin Time: 14.6 seconds (ref 11.4–15.2)

## 2020-11-12 LAB — SARS CORONAVIRUS 2 (TAT 6-24 HRS): SARS Coronavirus 2: NEGATIVE

## 2020-11-12 LAB — TYPE AND SCREEN
ABO/RH(D): AB POS
Antibody Screen: NEGATIVE

## 2020-11-12 MED ORDER — SODIUM CHLORIDE 0.9 % IV SOLN
125.0000 mg | Freq: Every day | INTRAVENOUS | Status: AC
  Administered 2020-11-12 – 2020-11-14 (×3): 125 mg via INTRAVENOUS
  Filled 2020-11-12 (×3): qty 10

## 2020-11-12 MED ORDER — SACUBITRIL-VALSARTAN 49-51 MG PO TABS
0.5000 | ORAL_TABLET | Freq: Two times a day (BID) | ORAL | Status: DC
  Administered 2020-11-12: 0.5 via ORAL
  Filled 2020-11-12 (×3): qty 0.5

## 2020-11-12 MED ORDER — PERFLUTREN LIPID MICROSPHERE
1.0000 mL | INTRAVENOUS | Status: AC | PRN
  Administered 2020-11-12: 2 mL via INTRAVENOUS
  Filled 2020-11-12: qty 10

## 2020-11-12 MED ORDER — FUROSEMIDE 10 MG/ML IJ SOLN
40.0000 mg | Freq: Two times a day (BID) | INTRAMUSCULAR | Status: DC
  Administered 2020-11-12 – 2020-11-14 (×4): 40 mg via INTRAVENOUS
  Filled 2020-11-12 (×4): qty 4

## 2020-11-12 MED ORDER — CARVEDILOL 12.5 MG PO TABS
12.5000 mg | ORAL_TABLET | Freq: Two times a day (BID) | ORAL | Status: DC
  Administered 2020-11-12 – 2020-11-16 (×6): 12.5 mg via ORAL
  Filled 2020-11-12 (×8): qty 1

## 2020-11-12 MED ORDER — GUAIFENESIN-DM 100-10 MG/5ML PO SYRP
5.0000 mL | ORAL_SOLUTION | ORAL | Status: DC | PRN
  Administered 2020-11-12 – 2020-12-04 (×19): 5 mL via ORAL
  Filled 2020-11-12 (×20): qty 5

## 2020-11-12 MED ORDER — FUROSEMIDE 40 MG PO TABS
40.0000 mg | ORAL_TABLET | Freq: Every day | ORAL | Status: DC
Start: 2020-11-12 — End: 2020-11-12
  Administered 2020-11-12: 40 mg via ORAL
  Filled 2020-11-12: qty 1

## 2020-11-12 MED ORDER — FLUTICASONE PROPIONATE 50 MCG/ACT NA SUSP
2.0000 | Freq: Two times a day (BID) | NASAL | Status: DC
  Administered 2020-11-12 – 2020-12-03 (×30): 2 via NASAL
  Filled 2020-11-12: qty 16

## 2020-11-12 NOTE — Progress Notes (Signed)
   11/11/20 2311  Assess: MEWS Score  Temp 98.6 F (37 C)  BP 100/70  Pulse Rate (!) 109  Resp 18  SpO2 93 %  O2 Device Room Air  Assess: MEWS Score  MEWS Temp 0  MEWS Systolic 1  MEWS Pulse 1  MEWS RR 0  MEWS LOC 0  MEWS Score 2  MEWS Score Color Yellow  Assess: if the MEWS score is Yellow or Red  Were vital signs taken at a resting state? Yes  Focused Assessment No change from prior assessment  Early Detection of Sepsis Score *See Row Information* Low  MEWS guidelines implemented *See Row Information* No, previously yellow, continue vital signs every 4 hours  Document  Patient Outcome Stabilized after interventions  Progress note created (see row info) Yes

## 2020-11-12 NOTE — Progress Notes (Signed)
Paged Dr. Cathlean Sauer regarding BP 84/60 (map 69). Pulse 99 and O2 100%. Patient asymptomatic.

## 2020-11-12 NOTE — Progress Notes (Signed)
PROGRESS NOTE    Marc [name]  [id number] DOB: 06-Sep-1950 DOA: 11/11/2020 PCP: Lawerance Cruel, MD    Brief Narrative:  [name] is being consulted for symptomatic anemia.  70 year old male with past medical history for type 2 diabetes mellitus, coronary artery disease, ischemic cardiomyopathy, diastolic heart failure, chronic kidney disease stage III, hypertension, dyslipidemia and GERD who was directly admitted to the cardiology service due to progressive generalized weakness and dyspnea on exertion, concerns for worsening anemia. Patient reported several weeks of generalized weakness, associated with dyspnea, PND, orthopnea, increased abdominal girth and lower extremity edema. Outpatient follow-up with cardiology 3/25 showed hemoglobin 7.8, down from 8.9 couple weeks prior. Outpatient GI work-up 02/22, upper endoscopy negative for upper GI bleed.  He has been on oral iron, since then positive black stools. His vital signs at the time of consultation blood pressure 115/76, heart rate 106, respiratory rate 18, temperature 97.6, oxygenation 90%, lungs with showing faint rales bilaterally, heart S1-S2, present, rhythmic, abdomen soft, positive bilateral extremity edema.   White count 7.5, hemoglobin 8.9, hematocrit 30.0, platelets 250. SARS COVID 19 negative. Chest radiograph with mild cardiomegaly, and bilateral vascular congestion, with bilateral interstitial infiltrates.   EKG 108 bpm, left axis deviation with left anterior fascicular block, interventricular conduction delay, sinus rhythm, poor r wave progression, with no significant ST segment or t wave changes.   Assessment & Plan:   Principal Problem:   Acute on chronic combined systolic and diastolic CHF (congestive heart failure) (HCC) Active Problems:   Cardiomyopathy ([place])   Coronary artery disease involving native coronary artery of native heart without angina pectoris   Type 2 diabetes mellitus with stage 3  chronic kidney disease, without long-term current use of insulin (HCC)   Chronic kidney disease, stage 3b (HCC)   Iron (Fe) deficiency anemia   Ischemic cardiomyopathy   Mixed hyperlipidemia due to type 2 diabetes mellitus (HCC)   Dyspnea on exertion   GERD without esophagitis   1. Acute decompensation of systolic heart failure/ ischemic cardiomyopathy. Patient continue to be volume overloaded. His urine output over last 24 hrs 1,175 ml.  Systolic blood pressure 98 to 101 mmHg, with warm lower extremities.   Continue diuresis with furosemide, to target further negative fluid balance.  Continue with carvedilol and sacubitril/ valsartan for heart failure management.   Echocardiogram with EF 20 to 25 % with global hypokinesis, with akinesis of the apex, RV systolic function preserved. Moderate to severe mitral regurgitation.   2. Symptomatic iron deficiency anemia/ anemia of chronic disease. No clinical signs of active bleeding, recent EGD with no upper GI bleed.   Iron is 33, transferrin saturation is 12, TIBC 283 and ferritin is 126,   Will add IV iron, and follow iron level as outpatient.   3. CKD stage 3b. Renal function with serum cr at 1,76, K is 4,0 and serum bicarbonate at 27. Continue diuresis with furosemide for volume overload.  Follow up renal function in am, avoid hypotension and nephrotoxic medications.   Anemia of chronic renal disease, once Iron stores replenish consider erythropoietin injections.   4. T2DM with dyslipidemia. Continue glucose cover and monitoring with insulin sliding scale. Patient is tolerating po well.  Continue with ezetimibe    5. GERD. Continue with antiacid therapy.   6. Sinusitis. Add bid flonase.   Patient continue to be at high risk for worsening heart failure   Status is: Inpatient  Remains inpatient appropriate because:IV treatments appropriate due to intensity of illness  or inability to take PO   Dispo: The patient is from: Home               Anticipated d/c is to: Home              Patient currently is not medically stable to d/c.   Difficult to place patient No   DVT prophylaxis: Heparin   Code Status:   full  Family Communication:  No family at the bedside      Subjective: Patient continue to have dyspnea and lower extremity edema, positive pharyngeal phlegm and cough, no chest pain, no melena.    Objective: Vitals:   11/11/20 2311 11/12/20 0042 11/12/20 0504 11/12/20 0600  BP: 100/70 100/72 101/68 98/69  Pulse: (!) 109  (!) 102 99  Resp: '18 18 18   '$ Temp: 98.6 F (37 C)  98.5 F (36.9 C)   TempSrc: Oral  Oral   SpO2: 93%  98%   Weight:   76 kg   Height:        Intake/Output Summary (Last 24 hours) at 11/12/2020 1217 Last data filed at 11/12/2020 0500 Gross per 24 hour  Intake 200 ml  Output 1175 ml  Net -975 ml   Filed Weights   11/11/20 1940 11/12/20 0504  Weight: 76.7 kg 76 kg    Examination:   General:  deconditioned and positive dyspnea at rest.  Neurology: Awake and alert, non focal  E ENT: no pallor, no icterus, oral mucosa moist Cardiovascular: moderate JVD. S1-S2 present, rhythmic, no gallops, rubs, or murmurs. ++ pitting bilateral lower extremity edema. Pulmonary: positive breath sounds bilaterally, with no wheezing, scattered rhonchi and bilateral rales. Gastrointestinal. Abdomen soft and non tender Skin. No rashes Musculoskeletal: no joint deformities     Data Reviewed: I have personally reviewed following labs and imaging studies  CBC: Recent Labs  Lab 11/10/20 1255 11/11/20 2151 11/12/20 0253 11/12/20 0851  WBC 6.2 7.5 7.9 6.4  NEUTROABS  --  5.9  --   --   HGB 7.8* 8.9* 8.6* 8.0*  HCT 26.3* 30.0* 26.9* 26.2*  MCV 80 81.7 80.3 80.4  PLT 227 250 284 0000000   Basic Metabolic Panel: Recent Labs  Lab 11/07/20 1016 11/11/20 2151 11/12/20 0253  NA 137  --  137  K 4.1  --  4.0  CL 100  --  101  CO2 20  --  27  GLUCOSE 150*  --  95  BUN 24  --  28*   CREATININE 1.69*  --  1.76*  CALCIUM 9.2  --  9.4  MG  --  2.0  --    GFR: Estimated Creatinine Clearance: 37 mL/min (A) (by C-G formula based on SCr of 1.76 mg/dL (H)). Liver Function Tests: Recent Labs  Lab 11/11/20 2151  AST 31  ALT 25  ALKPHOS 55  BILITOT 1.4*  PROT 7.1  ALBUMIN 3.6   No results for input(s): LIPASE, AMYLASE in the last 168 hours. No results for input(s): AMMONIA in the last 168 hours. Coagulation Profile: Recent Labs  Lab 11/12/20 0253  INR 1.2   Cardiac Enzymes: No results for input(s): CKTOTAL, CKMB, CKMBINDEX, TROPONINI in the last 168 hours. BNP (last 3 results) Recent Labs    10/24/20 1119  PROBNP 1,685*   HbA1C: Recent Labs    11/12/20 0253  HGBA1C 5.7*   CBG: Recent Labs  Lab 11/11/20 2010 11/12/20 0556  GLUCAP 145* 84   Lipid Profile: No results for input(s):  CHOL, HDL, LDLCALC, TRIG, CHOLHDL, LDLDIRECT in the last 72 hours. Thyroid Function Tests: Recent Labs    11/11/20 2158  TSH 3.685   Anemia Panel: Recent Labs    11/10/20 1255 11/11/20 2151 11/11/20 2157 11/11/20 2158  VITAMINB12  --  605  --   --   FOLATE  --   --  28.7  --   FERRITIN 126  --   --   --   TIBC 283  --   --   --   IRON 33*  --   --   --   RETICCTPCT  --   --   --  3.1      Radiology Studies: I have reviewed all of the imaging during this hospital visit personally     Scheduled Meds: . atorvastatin  40 mg Oral Daily  . carvedilol  12.5 mg Oral BID WC  . ezetimibe  10 mg Oral Daily  . furosemide  40 mg Oral Daily  . insulin aspart  0-15 Units Subcutaneous TID AC & HS  . multivitamin with minerals  1 tablet Oral Daily  . omega-3 acid ethyl esters  1 g Oral Daily  . pantoprazole  40 mg Oral Daily  . sacubitril-valsartan  0.5 tablet Oral BID  . sodium chloride flush  3 mL Intravenous Q12H   Continuous Infusions: . sodium chloride       LOS: 1 day        Mauricio Gerome Apley, MD

## 2020-11-12 NOTE — Progress Notes (Signed)
   Not reflected on medication schedule in Epic, for past 10 days he is taking: Furosemide 40 mg daily, Entresto 49/51 mg, 0.5 mg BID; and Carvedilol 25 mg , 0.5 mg BID. I updated the meds ordered. He had become hypotensive on full dose carvedilol and Entresto. The doses were decreased to use higher dose of lasix to clear volume.

## 2020-11-12 NOTE — Progress Notes (Signed)
Dr. Cathlean Sauer recommended continuing to monitor VS and no new orders at this time.

## 2020-11-12 NOTE — Progress Notes (Signed)
Progress Note  Patient Name: Marc [name] Date of Encounter: 11/12/2020  [facility] Cardiologist: Sinclair Grooms, MD   Subjective   Better. Still orthopneic, speaking in interrupted sentences. Net diuresis roughly 1 L since admission.  Weight down by roughly 1.5 pounds. Renal parameters and BNP are essentially unchanged from earlier this month.  He noticed reduction in exercise tolerance starting after the winter Holidays (onset of anemia), but then fairly abrupt worsening at the beginning of March (worsening cardiomyopathy?). Just before admission he developed PND for several nights (cough would wake him up around midnight, would walk around 20-30 minutes to improve) and is now orthopneic.  Inpatient Medications    Scheduled Meds: . atorvastatin  40 mg Oral Daily  . carvedilol  12.5 mg Oral BID WC  . ezetimibe  10 mg Oral Daily  . furosemide  40 mg Oral Daily  . insulin aspart  0-15 Units Subcutaneous TID AC & HS  . multivitamin with minerals  1 tablet Oral Daily  . omega-3 acid ethyl esters  1 g Oral Daily  . pantoprazole  40 mg Oral Daily  . sacubitril-valsartan  0.5 tablet Oral BID  . sodium chloride flush  3 mL Intravenous Q12H   Continuous Infusions: . sodium chloride     PRN Meds: sodium chloride, acetaminophen, guaiFENesin-dextromethorphan, ondansetron (ZOFRAN) IV, sodium chloride flush   Vital Signs    Vitals:   11/11/20 2311 11/12/20 0042 11/12/20 0504 11/12/20 0600  BP: 100/70 100/72 101/68 98/69  Pulse: (!) 109  (!) 102 99  Resp: '18 18 18   '$ Temp: 98.6 F (37 C)  98.5 F (36.9 C)   TempSrc: Oral  Oral   SpO2: 93%  98%   Weight:   76 kg   Height:        Intake/Output Summary (Last 24 hours) at 11/12/2020 1227 Last data filed at 11/12/2020 0500 Gross per 24 hour  Intake 200 ml  Output 1175 ml  Net -975 ml   Last 3 Weights 11/12/2020 11/11/2020 10/30/2020  Weight (lbs) 167 lb 8.8 oz 169 lb 170 lb 6.4 oz  Weight (kg) 76 kg 76.658 kg  77.293 kg      Telemetry    sinus tachycardia - Personally Reviewed  ECG    Sinus tachycardia, 1st deg AVB, left axis deviation/LAFB (but QRS 130 ms, nonspecific IVCD), lateral Q waves and PRWP, no acute repol changes; No changes - Personally Reviewed  Physical Exam  Orthopneic GEN: No acute distress.   Neck: 10 cm JVD Cardiac: RRR, faint holosystolic apical murmur is only heard in left decubitus, no diastolic murmurs, rubs, loud S3/summation gallop.  Respiratory: Clear to auscultation bilaterally. GI: Soft, nontender, non-distended  MS: symmetrical 1-2+ pretibial edema edema; No deformity. Neuro:  Nonfocal  Psych: Normal affect   Labs    High Sensitivity Troponin:  No results for input(s): TROPONINIHS in the last 720 hours.    Chemistry Recent Labs  Lab 11/07/20 1016 11/11/20 2151 11/12/20 0253  NA 137  --  137  K 4.1  --  4.0  CL 100  --  101  CO2 20  --  27  GLUCOSE 150*  --  95  BUN 24  --  28*  CREATININE 1.69*  --  1.76*  CALCIUM 9.2  --  9.4  PROT  --  7.1  --   ALBUMIN  --  3.6  --   AST  --  31  --   ALT  --  25  --   ALKPHOS  --  55  --   BILITOT  --  1.4*  --   GFRNONAA  --   --  41*  ANIONGAP  --   --  9     Hematology Recent Labs  Lab 11/11/20 2151 11/11/20 2158 11/12/20 0253 11/12/20 0851  WBC 7.5  --  7.9 6.4  RBC 3.67* 3.58* 3.35* 3.26*  HGB 8.9*  --  8.6* 8.0*  HCT 30.0*  --  26.9* 26.2*  MCV 81.7  --  80.3 80.4  MCH 24.3*  --  25.7* 24.5*  MCHC 29.7*  --  32.0 30.5  RDW 19.5*  --  19.4* 19.3*  PLT 250  --  284 233    BNP Recent Labs  Lab 11/11/20 2158  BNP 1,728.5*     DDimer No results for input(s): DDIMER in the last 168 hours.   Radiology    DG Chest Port 1 View  Result Date: 11/11/2020 CLINICAL DATA:  Shortness of breath EXAM: PORTABLE CHEST 1 VIEW COMPARISON:  None. FINDINGS: Cardiac shadow is mildly prominent but accentuated by the frontal technique. Vascular congestion and patchy infiltrates are noted throughout  both lungs likely representing congestive failure. No bony abnormality noted. IMPRESSION: Changes most likely representing vascular congestion and pulmonary edema. Electronically Signed   By: Inez Catalina M.D.   On: 11/11/2020 22:16   ECHOCARDIOGRAM COMPLETE  Result Date: 11/12/2020    ECHOCARDIOGRAM REPORT   Patient Name:   Marc [name] Date of Exam: 11/12/2020 Medical Rec #:  [id number]           Height:       67.0 in Accession #:    [id number]          Weight:       167.5 lb Date of Birth:  09-08-1950           BSA:          1.876 m Patient Age:    70 years            BP:           98/69 mmHg Patient Gender: M                   HR:           107 bpm. Exam Location:  Inpatient Procedure: 2D Echo, Cardiac Doppler, Color Doppler and Intracardiac            Opacification Agent Indications:    CHF-Acute Systolic [id number]  History:        Patient has prior history of Echocardiogram examinations, most                 recent 03/24/2018. Cardiomyopathy, Signs/Symptoms:Chest Pain; Risk                 Factors:Hypertension, Diabetes and Dyslipidemia.  Sonographer:    Vickie Epley RDCS Referring Phys: [id number] [place]  1. Global hypokinesis with akinesis of the apex; overall severe LV dysfunction.  2. Left ventricular ejection fraction, by estimation, is 20 to 25%. The left ventricle has severely decreased function. The left ventricle demonstrates regional wall motion abnormalities (see scoring diagram/findings for description). The left ventricular internal cavity size was mildly dilated. Left ventricular diastolic parameters are indeterminate.  3. Right ventricular systolic function is normal. The right ventricular size is normal. There is moderately elevated pulmonary artery systolic pressure.  4. Left atrial size was  moderately dilated.  5. Moderate pleural effusion in the left lateral region.  6. The mitral valve is normal in structure. Moderate to severe mitral valve regurgitation. No evidence of  mitral stenosis.  7. The aortic valve is tricuspid. Aortic valve regurgitation is not visualized. No aortic stenosis is present.  8. The inferior vena cava is dilated in size with >50% respiratory variability, suggesting right atrial pressure of 8 mmHg. FINDINGS  Left Ventricle: Left ventricular ejection fraction, by estimation, is 20 to 25%. The left ventricle has severely decreased function. The left ventricle demonstrates regional wall motion abnormalities. Definity contrast agent was given IV to delineate the left ventricular endocardial borders. The left ventricular internal cavity size was mildly dilated. There is no left ventricular hypertrophy. Left ventricular diastolic parameters are indeterminate. Right Ventricle: The right ventricular size is normal.Right ventricular systolic function is normal. There is moderately elevated pulmonary artery systolic pressure. The tricuspid regurgitant velocity is 3.44 m/s, and with an assumed right atrial pressure of 8 mmHg, the estimated right ventricular systolic pressure is [id number] mmHg. Left Atrium: Left atrial size was moderately dilated. Right Atrium: Right atrial size was normal in size. Pericardium: There is no evidence of pericardial effusion. Mitral Valve: The mitral valve is normal in structure. Moderate to severe mitral valve regurgitation. No evidence of mitral valve stenosis. Tricuspid Valve: The tricuspid valve is normal in structure. Tricuspid valve regurgitation is mild . No evidence of tricuspid stenosis. Aortic Valve: The aortic valve is tricuspid. Aortic valve regurgitation is not visualized. No aortic stenosis is present. Pulmonic Valve: The pulmonic valve was normal in structure. Pulmonic valve regurgitation is not visualized. No evidence of pulmonic stenosis. Aorta: The aortic root is normal in size and structure. Venous: The inferior vena cava is dilated in size with greater than 50% respiratory variability, suggesting right atrial pressure of 8  mmHg. IAS/Shunts: No atrial level shunt detected by color flow Doppler. Additional Comments: Global hypokinesis with akinesis of the apex; overall severe LV dysfunction. There is a moderate pleural effusion in the left lateral region.  LEFT VENTRICLE PLAX 2D LVIDd:         5.80 cm LVIDs:         4.90 cm LV PW:         0.80 cm LV IVS:        0.80 cm LVOT diam:     2.00 cm LV SV:         35 LV SV Index:   19 LVOT Area:     3.14 cm  LV Volumes (MOD) LV vol d, MOD A2C: 250.0 ml LV vol d, MOD A4C: 206.0 ml LV vol s, MOD A2C: 174.0 ml LV vol s, MOD A4C: 177.0 ml LV SV MOD A2C:     76.0 ml LV SV MOD A4C:     206.0 ml LV SV MOD BP:      52.9 ml RIGHT VENTRICLE RV S prime:     11.90 cm/s TAPSE (M-mode): 1.8 cm LEFT ATRIUM             Index       RIGHT ATRIUM           Index LA diam:        4.50 cm 2.40 cm/m  RA Area:     16.20 cm LA Vol (A2C):   70.5 ml 37.59 ml/m RA Volume:   48.80 ml  26.02 ml/m LA Vol (A4C):   57.7 ml 30.76 ml/m LA Biplane Vol:  66.2 ml 35.29 ml/m  AORTIC VALVE LVOT Vmax:   68.50 cm/s LVOT Vmean:  51.500 cm/s LVOT VTI:    0.112 m  AORTA Ao Root diam: 3.20 cm Ao Asc diam:  3.10 cm MR Peak grad:    56.6 mmHg   TRICUSPID VALVE MR Mean grad:    36.0 mmHg   TR Peak grad:   47.3 mmHg MR Vmax:         376.00 cm/s TR Vmax:        344.00 cm/s MR Vmean:        286.0 cm/s MR PISA:         1.01 cm    SHUNTS MR PISA Eff ROA: 10 mm      Systemic VTI:  0.11 m MR PISA Radius:  0.40 cm     Systemic Diam: 2.00 cm Kirk Ruths MD Electronically signed by Kirk Ruths MD Signature Date/Time: 11/12/2020/11:59:06 AM    Final     Cardiac Studies   ECHO 11/12/2020  1. Global hypokinesis with akinesis of the apex; overall severe LV  dysfunction.  2. Left ventricular ejection fraction, by estimation, is 20 to 25%. The  left ventricle has severely decreased function. The left ventricle  demonstrates regional wall motion abnormalities (see scoring  diagram/findings for description). The left  ventricular  internal cavity size was mildly dilated. Left ventricular  diastolic parameters are indeterminate.  3. Right ventricular systolic function is normal. The right ventricular  size is normal. There is moderately elevated pulmonary artery systolic  pressure.  4. Left atrial size was moderately dilated.  5. Moderate pleural effusion in the left lateral region.  6. The mitral valve is normal in structure. Moderate to severe mitral  valve regurgitation. No evidence of mitral stenosis.  7. The aortic valve is tricuspid. Aortic valve regurgitation is not  visualized. No aortic stenosis is present.  8. The inferior vena cava is dilated in size with >50% respiratory  variability, suggesting right atrial pressure of 8 mmHg.  Nuclear stress test 2016  Nuclear stress EF: 26%.  Defect 1: There is a large defect of severe severity present in the basal inferolateral, mid anteroseptal, mid inferolateral, apical anterior, apical septal, apical inferior and apex location.  Findings consistent with prior myocardial infarction.  This is a high risk study.  The left ventricular ejection fraction is severely decreased (<30%).  Cardiac MRI 2016 1) Moderate LVE. Anterior, septal and apical akinesis. Quantitative EF 46% 2) No mural apical thrombus 3) Near full thickness scar involving above walls 4) No effusion 5) Normal RV   Patient Profile     70 y.o. male with longstanding cardiomyopathy with evidence of an extensive anterior wall scar and previously with mild-moderate reduction in left ventricular systolic function is admitted with acute exacerbation of chronic combined systolic and diastolic heart failure in the setting of worsening iron deficiency anemia.  He is found to have further reduction in left ventricular systolic function, new regional wall motion abnormality and moderate to severe mitral insufficiency with probably ischemic mechanism.  Assessment & Plan    1. CHF: Left  ventricular systolic function has deteriorated markedly compared to 2019 and the difference appears to be a new inferior/inferolateral wall motion abnormality.  No change in the area of scar in the LAD artery distribution.  Mitral insufficiency is now at least moderate to severe.  He is on maximum tolerated doses of Entresto and carvedilol (dose was recently reduced due to hypotension).  Currently on carvedilol  12.5 mg (half of 25 mg tablet) twice daily and Entresto 49/50 1/2 tablet twice daily per Dr. Thompson Caul notes.  Did not tolerate SGLT2 therapy due to yeast infection. 2. MR: There is marked worsening of the severity of mitral regurgitation compared to his echocardiogram from 2019 and the mechanism appears to be ischemic tethering due to a new wall motion abnormality in the inferolateral wall.  The jet appears to originate from the middle third of the mitral coaptation line, with a mostly central jet, slightly eccentric anteriorly directed jet in the long axis view.  There may be slight reversal of systolic flow seen in the right upper pulmonary vein.  The mitral insufficiency and decreased EF are the major cause for his heart failure decompensation, rather than just the anemia. Would hope to see improvement of MR with treatment of CHF and revascularization of RCA. If not, anatomy appears amenable to MitraClip. 3. CAD: "with large anterior myocardial infarction 1997. PTCA LAD. Distal circumflex and first diagonal DES 2011". There is a new wall motion abnormality involving the territory of the distal LCX and/or right coronary artery.  Although there is severe hypokinesis in this territory, the inferolateral myocardium is not thin suggesting that there may be at least some viability. He had chest discomfort ("big burp that would not come out") with previous MI in 1997. No chest discomfort in last several months, since dyspnea has worsened. It's possible that he did not infarct and has hibernating myocardium in  the inferior wall. 4. Anemia: Although the labs do confirm a decrease in transferrin saturation, he only has borderline microcytosis and his ferritin is not low.  Anemia may be multifactorial and also related to his renal insufficiency.  Recently.  Stool Hemoccult studies were negative per notes.  EGD performed 09/29/2020 did not show abnormalities.  Has not had a colonoscopy yet (last 2009). 5. HLP: Excellent LDL reduction on statin, but HDL is also very low at only 26.  This is an improvement from 2015 when his HDL was only 19. 6. DM: Excellent glycemic control, hemoglobin A1c 5.7%.  Of yeast infection when treated with SGLT2 inhibitors. 7. CKD 3: Creatinine is currently appears to be only slightly worse than his baseline which appears to be around 1.5 (GFR around 50).  Continue to monitor daily during diuresis.  Will need repeat coronary angiography, but first needs additional diuresis so he can lie flat for the procedure. Will also need to make sure that renal function is not worse than baseline (as it is currently) and would be useful to clarify cause of his Fe deficiency, before he is committed to dual antiplatelet therapy.     For questions or updates, please contact [place] Please consult [email or web address] for contact info under        Signed, Sanda Klein, MD  11/12/2020, 12:27 PM

## 2020-11-12 NOTE — Progress Notes (Signed)
  Echocardiogram 2D Echocardiogram has been performed.  Michiel Cowboy 11/12/2020, 9:42 AM

## 2020-11-13 DIAGNOSIS — I2510 Atherosclerotic heart disease of native coronary artery without angina pectoris: Secondary | ICD-10-CM | POA: Diagnosis not present

## 2020-11-13 DIAGNOSIS — I5043 Acute on chronic combined systolic (congestive) and diastolic (congestive) heart failure: Secondary | ICD-10-CM | POA: Diagnosis not present

## 2020-11-13 DIAGNOSIS — N1832 Chronic kidney disease, stage 3b: Secondary | ICD-10-CM | POA: Diagnosis not present

## 2020-11-13 DIAGNOSIS — I429 Cardiomyopathy, unspecified: Secondary | ICD-10-CM | POA: Diagnosis not present

## 2020-11-13 LAB — GLUCOSE, CAPILLARY
Glucose-Capillary: 109 mg/dL — ABNORMAL HIGH (ref 70–99)
Glucose-Capillary: 221 mg/dL — ABNORMAL HIGH (ref 70–99)
Glucose-Capillary: 153 mg/dL — ABNORMAL HIGH (ref 70–99)
Glucose-Capillary: 125 mg/dL — ABNORMAL HIGH (ref 70–99)

## 2020-11-13 LAB — HEMOGLOBIN AND HEMATOCRIT, BLOOD
HCT: 25.5 % — ABNORMAL LOW (ref 39.0–52.0)
Hemoglobin: 7.9 g/dL — ABNORMAL LOW (ref 13.0–17.0)

## 2020-11-13 LAB — BASIC METABOLIC PANEL
Anion gap: 7 (ref 5–15)
BUN: 25 mg/dL — ABNORMAL HIGH (ref 8–23)
CO2: 26 mmol/L (ref 22–32)
Calcium: 8.9 mg/dL (ref 8.9–10.3)
Chloride: 100 mmol/L (ref 98–111)
Creatinine, Ser: 1.63 mg/dL — ABNORMAL HIGH (ref 0.61–1.24)
GFR, Estimated: 45 mL/min — ABNORMAL LOW (ref >60)
Glucose, Bld: 113 mg/dL — ABNORMAL HIGH (ref 70–99)
Potassium: 3.3 mmol/L — ABNORMAL LOW (ref 3.5–5.1)
Sodium: 133 mmol/L — ABNORMAL LOW (ref 135–145)

## 2020-11-13 MED ORDER — POTASSIUM CHLORIDE 20 MEQ PO PACK
40.0000 meq | PACK | Freq: Once | ORAL | Status: AC
  Administered 2020-11-13: 40 meq via ORAL

## 2020-11-13 MED ORDER — ENOXAPARIN SODIUM 40 MG/0.4ML ~~LOC~~ SOLN
40.0000 mg | SUBCUTANEOUS | Status: DC
  Administered 2020-11-13 – 2020-11-15 (×3): 40 mg via SUBCUTANEOUS
  Filled 2020-11-13 (×3): qty 0.4

## 2020-11-13 MED ORDER — POTASSIUM CHLORIDE CRYS ER 20 MEQ PO TBCR
40.0000 meq | EXTENDED_RELEASE_TABLET | Freq: Once | ORAL | Status: AC
  Administered 2020-11-13: 40 meq via ORAL
  Filled 2020-11-13: qty 2

## 2020-11-13 MED ORDER — SACUBITRIL-VALSARTAN 49-51 MG PO TABS
0.5000 | ORAL_TABLET | Freq: Two times a day (BID) | ORAL | Status: DC
  Administered 2020-11-14: 0.5 via ORAL

## 2020-11-13 MED ORDER — POLYETHYLENE GLYCOL 3350 17 G PO PACK
17.0000 g | PACK | Freq: Every day | ORAL | Status: DC
  Administered 2020-11-13 – 2020-12-01 (×9): 17 g via ORAL
  Filled 2020-11-13 (×18): qty 1

## 2020-11-13 MED ORDER — POTASSIUM CHLORIDE 20 MEQ PO PACK
80.0000 meq | PACK | Freq: Once | ORAL | Status: DC
  Filled 2020-11-13: qty 4

## 2020-11-13 NOTE — Progress Notes (Signed)
Progress Note  Patient Name: ALGOT DENNINGTON Date of Encounter: 11/13/2020  Primary Cardiologist: Sinclair Grooms, MD   Subjective   Overnight had asymptomatic hypotension.  Patient notes he is feeling better.  Still on lasix.  He ambulates to the bathroom on his own; and is doing his own I/Os with nurse assist.  Has have two voids since AM lasix dose.  Inpatient Medications    Scheduled Meds: . atorvastatin  40 mg Oral Daily  . carvedilol  12.5 mg Oral BID WC  . ezetimibe  10 mg Oral Daily  . fluticasone  2 spray Each Nare BID  . furosemide  40 mg Intravenous BID  . insulin aspart  0-15 Units Subcutaneous TID AC & HS  . multivitamin with minerals  1 tablet Oral Daily  . omega-3 acid ethyl esters  1 g Oral Daily  . pantoprazole  40 mg Oral Daily  . sacubitril-valsartan  0.5 tablet Oral BID  . sodium chloride flush  3 mL Intravenous Q12H   Continuous Infusions: . sodium chloride    . ferric gluconate (FERRLECIT/NULECIT) IV 125 mg (11/13/20 0940)   PRN Meds: sodium chloride, acetaminophen, guaiFENesin-dextromethorphan, ondansetron (ZOFRAN) IV, sodium chloride flush   Vital Signs    Vitals:   11/12/20 2300 11/13/20 0200 11/13/20 0551 11/13/20 0736  BP: 111/74  111/70 97/68  Pulse: 95  (!) 102 99  Resp: '18 20 18 18  '$ Temp: 99 F (37.2 C)  98.2 F (36.8 C) 98 F (36.7 C)  TempSrc: Oral  Oral Oral  SpO2: 98% 99% 100% 100%  Weight:   77 kg   Height:        Intake/Output Summary (Last 24 hours) at 11/13/2020 1107 Last data filed at 11/13/2020 0830 Gross per 24 hour  Intake 1380 ml  Output 1700 ml  Net -320 ml   Filed Weights   11/11/20 1940 11/12/20 0504 11/13/20 0551  Weight: 76.7 kg 76 kg 77 kg    Telemetry    Sinus rhythm to sinus tachycardia - Personally Reviewed  ECG    No new last 24 - Personally Reviewed  Physical Exam   GEN: No acute distress.   Neck: No JVD Cardiac: RRR, II/VI Holosystolic murmur, rubs, or gallops.  Respiratory: Clear  to auscultation bilaterally. GI: Soft, nontender, non-distended  MS: No edema; No deformity. Neuro:  Nonfocal  Psych: Normal affect   Labs    Chemistry Recent Labs  Lab 11/07/20 1016 11/11/20 2151 11/12/20 0253 11/13/20 0333  NA 137  --  137 133*  K 4.1  --  4.0 3.3*  CL 100  --  101 100  CO2 20  --  27 26  GLUCOSE 150*  --  95 113*  BUN 24  --  28* 25*  CREATININE 1.69*  --  1.76* 1.63*  CALCIUM 9.2  --  9.4 8.9  PROT  --  7.1  --   --   ALBUMIN  --  3.6  --   --   AST  --  31  --   --   ALT  --  25  --   --   ALKPHOS  --  55  --   --   BILITOT  --  1.4*  --   --   GFRNONAA  --   --  41* 45*  ANIONGAP  --   --  9 7     Hematology Recent Labs  Lab 11/11/20 2151 11/11/20 2158  11/12/20 0253 11/12/20 0851 11/13/20 0333  WBC 7.5  --  7.9 6.4  --   RBC 3.67* 3.58* 3.35* 3.26*  --   HGB 8.9*  --  8.6* 8.0* 7.9*  HCT 30.0*  --  26.9* 26.2* 25.5*  MCV 81.7  --  80.3 80.4  --   MCH 24.3*  --  25.7* 24.5*  --   MCHC 29.7*  --  32.0 30.5  --   RDW 19.5*  --  19.4* 19.3*  --   PLT 250  --  284 233  --     Cardiac EnzymesNo results for input(s): TROPONINI in the last 168 hours. No results for input(s): TROPIPOC in the last 168 hours.   BNP Recent Labs  Lab 11/11/20 2158  BNP 1,728.5*     DDimer No results for input(s): DDIMER in the last 168 hours.   Radiology    DG Chest Port 1 View  Result Date: 11/11/2020 CLINICAL DATA:  Shortness of breath EXAM: PORTABLE CHEST 1 VIEW COMPARISON:  None. FINDINGS: Cardiac shadow is mildly prominent but accentuated by the frontal technique. Vascular congestion and patchy infiltrates are noted throughout both lungs likely representing congestive failure. No bony abnormality noted. IMPRESSION: Changes most likely representing vascular congestion and pulmonary edema. Electronically Signed   By: Inez Catalina M.D.   On: 11/11/2020 22:16   ECHOCARDIOGRAM COMPLETE  Result Date: 11/12/2020    ECHOCARDIOGRAM REPORT   Patient Name:    CAROLL MCGATH Hillock Date of Exam: 11/12/2020 Medical Rec #:  [id number]           Height:       67.0 in Accession #:    [id number]          Weight:       167.5 lb Date of Birth:  [birth year]           BSA:          1.876 m Patient Age:    70 years            BP:           98/69 mmHg Patient Gender: M                   HR:           107 bpm. Exam Location:  Inpatient Procedure: 2D Echo, Cardiac Doppler, Color Doppler and Intracardiac            Opacification Agent Indications:    CHF-Acute Systolic [id number]  History:        Patient has prior history of Echocardiogram examinations, most                 recent 03/24/2018. Cardiomyopathy, Signs/Symptoms:Chest Pain; Risk                 Factors:Hypertension, Diabetes and Dyslipidemia.  Sonographer:    Vickie Epley RDCS Referring Phys: [id number] [place]  1. Global hypokinesis with akinesis of the apex; overall severe LV dysfunction.  2. Left ventricular ejection fraction, by estimation, is 20 to 25%. The left ventricle has severely decreased function. The left ventricle demonstrates regional wall motion abnormalities (see scoring diagram/findings for description). The left ventricular internal cavity size was mildly dilated. Left ventricular diastolic parameters are indeterminate.  3. Right ventricular systolic function is normal. The right ventricular size is normal. There is moderately elevated pulmonary artery systolic pressure.  4. Left atrial size was moderately dilated.  5. Moderate  pleural effusion in the left lateral region.  6. The mitral valve is normal in structure. Moderate to severe mitral valve regurgitation. No evidence of mitral stenosis.  7. The aortic valve is tricuspid. Aortic valve regurgitation is not visualized. No aortic stenosis is present.  8. The inferior vena cava is dilated in size with >50% respiratory variability, suggesting right atrial pressure of 8 mmHg. FINDINGS  Left Ventricle: Left ventricular ejection fraction, by estimation,  is 20 to 25%. The left ventricle has severely decreased function. The left ventricle demonstrates regional wall motion abnormalities. Definity contrast agent was given IV to delineate the left ventricular endocardial borders. The left ventricular internal cavity size was mildly dilated. There is no left ventricular hypertrophy. Left ventricular diastolic parameters are indeterminate. Right Ventricle: The right ventricular size is normal.Right ventricular systolic function is normal. There is moderately elevated pulmonary artery systolic pressure. The tricuspid regurgitant velocity is 3.44 m/s, and with an assumed right atrial pressure of 8 mmHg, the estimated right ventricular systolic pressure is [id number] mmHg. Left Atrium: Left atrial size was moderately dilated. Right Atrium: Right atrial size was normal in size. Pericardium: There is no evidence of pericardial effusion. Mitral Valve: The mitral valve is normal in structure. Moderate to severe mitral valve regurgitation. No evidence of mitral valve stenosis. Tricuspid Valve: The tricuspid valve is normal in structure. Tricuspid valve regurgitation is mild . No evidence of tricuspid stenosis. Aortic Valve: The aortic valve is tricuspid. Aortic valve regurgitation is not visualized. No aortic stenosis is present. Pulmonic Valve: The pulmonic valve was normal in structure. Pulmonic valve regurgitation is not visualized. No evidence of pulmonic stenosis. Aorta: The aortic root is normal in size and structure. Venous: The inferior vena cava is dilated in size with greater than 50% respiratory variability, suggesting right atrial pressure of 8 mmHg. IAS/Shunts: No atrial level shunt detected by color flow Doppler. Additional Comments: Global hypokinesis with akinesis of the apex; overall severe LV dysfunction. There is a moderate pleural effusion in the left lateral region.  LEFT VENTRICLE PLAX 2D LVIDd:         5.80 cm LVIDs:         4.90 cm LV PW:         0.80 cm LV IVS:         0.80 cm LVOT diam:     2.00 cm LV SV:         35 LV SV Index:   19 LVOT Area:     3.14 cm  LV Volumes (MOD) LV vol d, MOD A2C: 250.0 ml LV vol d, MOD A4C: 206.0 ml LV vol s, MOD A2C: 174.0 ml LV vol s, MOD A4C: 177.0 ml LV SV MOD A2C:     76.0 ml LV SV MOD A4C:     206.0 ml LV SV MOD BP:      52.9 ml RIGHT VENTRICLE RV S prime:     11.90 cm/s TAPSE (M-mode): 1.8 cm LEFT ATRIUM             Index       RIGHT ATRIUM           Index LA diam:        4.50 cm 2.40 cm/m  RA Area:     16.20 cm LA Vol (A2C):   70.5 ml 37.59 ml/m RA Volume:   48.80 ml  26.02 ml/m LA Vol (A4C):   57.7 ml 30.76 ml/m LA Biplane Vol: 66.2 ml 35.29 ml/m  AORTIC VALVE LVOT Vmax:   68.50 cm/s LVOT Vmean:  51.500 cm/s LVOT VTI:    0.112 m  AORTA Ao Root diam: 3.20 cm Ao Asc diam:  3.10 cm MR Peak grad:    56.6 mmHg   TRICUSPID VALVE MR Mean grad:    36.0 mmHg   TR Peak grad:   47.3 mmHg MR Vmax:         376.00 cm/s TR Vmax:        344.00 cm/s MR Vmean:        286.0 cm/s MR PISA:         1.01 cm    SHUNTS MR PISA Eff ROA: 10 mm      Systemic VTI:  0.11 m MR PISA Radius:  0.40 cm     Systemic Diam: 2.00 cm Kirk Ruths MD Electronically signed by Kirk Ruths MD Signature Date/Time: 11/12/2020/11:59:06 AM    Final     Cardiac Studies   HfrEF and moderate to severe MR  Patient Profile     70 y.o. male with longstanding cardiomyopathy with evidence of an extensive anterior wall scar and previously with mild-moderate reduction in left ventricular systolic function is admitted with acute exacerbation of chronic combined systolic and diastolic heart failure in the setting of worsening iron deficiency anemia.  He is found to have further reduction in left ventricular systolic function, new regional wall motion abnormality and moderate to severe mitral insufficiency with probably ischemic mechanism.  Assessment & Plan    Heart Failure Reduced Ejection Fraction  Moderate to Severe MR IDA - NYHA class III, Stage C,  hypervolemic, etiology from ischemia - Diuretic regimen: Continue 40 IV lasix; we are likely under reporting his outs - Discussed the importance of fluid restriction of < 2 L, salt restriction, and checking daily weights  - K repleted - Coreg 12.5 mg with no room to increase  - ARNI low dose with no room to increase - aldactone not tolerated with gynecomastia (could be future eplerenone or fineranone candidate - SGLT2i not tolerate with yeast infections - Device Indications: No need for device at this time; will need outpatient consideration - Day 1/3 of IV iron  Coronary Artery Disease; Obstructive HLD - possibly symptomatic - anatomy: dLCx PCI, D1 PCI in 2011  - ASA held in the setting of new anemia - continue statin,  And zetia goal LDL < 70 - continue BB as above - when euvolemic will use symptoms and creatinine to decide timing of ischemic evaluation (inpatient vs outpatient)  DM- reasonable glycemic control; no change presently  Diet- Cardiac carb consistent DVT PPx- SCDs ordered; discussed use with patient Full Code  Discussed with nursing, pharmacy, Dr. Harrington Challenger (outpatient MD)  For questions or updates, please contact [facility] Please consult [email or web address] for contact info under Cardiology/STEMI.      Signed, Werner Lean, MD  11/13/2020, 11:07 AM

## 2020-11-13 NOTE — Progress Notes (Signed)
Heart Failure Stewardship Pharmacist Progress Note   PCP: Lawerance Cruel, MD PCP-Cardiologist: Sinclair Grooms, MD    HPI:  70 yo M with PMH of T2DM, CAD, ICM, CHF, CKD III, HTN, HLD, and GERD. He presented to [facility] on 11/11/20 as a direct admit for shortness of breath and anemia. Also notably with orthopnea and fatigue. Hgb had decreased from 8.9 to 7.8 prior to admission and iron studies revealed TSAT 12, ferritin 126, and iron 33. An ECHO was done on 11/12/20 and LVEF was 20-25% (down from 35-40% in August 2019).  Current HF Medications: Furosemide 40 mg IV BID Carvedilol 12.5 mg BID Entresto 49/51 mg BID  Prior to admission HF Medications: Furosemide 40 mg daily Carvedilol 12.5 mg BID Entresto 49/51 mg BID  Pertinent Lab Values: . Serum creatinine 1.76>1.63, BUN 25, Potassium 3.3, Sodium 133, BNP 1728.5, Magnesium 2.0  Vital Signs: . Weight: 169 lbs (admission weight: 169 lbs) . Blood pressure: 90-110/70s  . Heart rate: 90s   Medication Assistance / Insurance Benefits Check: Does the patient have prescription insurance?  Yes Type of insurance plan: [employer] Medicare  Does the patient qualify for medication assistance through manufacturers or grants?   Pending . Eligible grants and/or patient assistance programs: pending . Medication assistance applications in progress: none  . Medication assistance applications approved: none Approved medication assistance renewals will be completed by: pending  Outpatient Pharmacy:  Prior to admission outpatient [facility]: [facility] Is the patient willing to use [facility] at discharge? Yes Is the patient willing to transition their outpatient pharmacy to utilize a [facility] outpatient pharmacy?   Pending    Assessment: 1. Acute on chronic systolic CHF (EF 0000000), due to ICM. NYHA class III symptoms. - Continue furosemide 40 mg IV BID - Continue carvedilol 12.5 mg BID - Continue Entresto 49/51 mg BID - Did not  tolerate spironolactone in the past due to gynecomastia. Consider starting eplerenone (once BP allows) to rechallenge MRA.   - Did not tolerate Jardiance in the past due to fungal infection. Could consider rechallenging with alternative SGLT2i [place]) with less risk for developing yeast infection if patient is willing to retry and be counseled on monitoring for adverse reactions - Nulecit 125 mg IV daily x 3 doses   Plan: 1) Medication changes recommended at this time: - Continue IV diuresis  2) Patient assistance: - None pending  3)  Education  - To be completed prior to discharge  Kerby Nora, PharmD, BCPS Heart Failure Stewardship Pharmacist Phone [phone]

## 2020-11-13 NOTE — Progress Notes (Signed)
Pt is requesting for medication that can help him have a bowel movement. Last bowel movement per pt 11/12/2020. MD Arrien was paged to make aware of pt request.

## 2020-11-13 NOTE — Progress Notes (Signed)
PROGRESS NOTE    Marc [name]  [id number] DOB: 14-Aug-1951 DOA: 11/11/2020 PCP: Lawerance Cruel, MD    Brief Narrative:  Mr. Kovalchuk is being consulted for symptomatic anemia.  70 year old male with past medical history for type 2 diabetes mellitus, coronary artery disease, ischemic cardiomyopathy, diastolic heart failure, chronic kidney disease stage III, hypertension, dyslipidemia and GERD who was directly admitted to the cardiology service due to progressive generalized weakness and dyspnea on exertion, concerns for worsening anemia. Patient reported several weeks of generalized weakness, associated with dyspnea, PND, orthopnea, increased abdominal girth and lower extremity edema. Outpatient follow-up with cardiology 3/25 showed hemoglobin 7.8, down from 8.9 couple weeks prior. Outpatient GI work-up 02/22, upper endoscopy negative for upper GI bleed.  He has been on oral iron, since then positive black stools. His vital signs at the time of consultation blood pressure 115/76, heart rate 106, respiratory rate 18, temperature 97.6, oxygenation 90%, lungs with showing faint rales bilaterally, heart S1-S2, present, rhythmic, abdomen soft, positive bilateral extremity edema.   White count 7.5, hemoglobin 8.9, hematocrit 30.0, platelets 250. SARS COVID 19 negative. Chest radiograph with mild cardiomegaly, and bilateral vascular congestion, with bilateral interstitial infiltrates.   EKG 108 bpm, left axis deviation with left anterior fascicular block, interventricular conduction delay, sinus rhythm, poor r wave progression, with no significant ST segment or t wave changes.    Assessment & Plan:   Principal Problem:   Acute on chronic combined systolic and diastolic CHF (congestive heart failure) (HCC) Active Problems:   Cardiomyopathy ([place])   Coronary artery disease involving native coronary artery of native heart without angina pectoris   Type 2 diabetes mellitus with stage  3 chronic kidney disease, without long-term current use of insulin (HCC)   Chronic kidney disease, stage 3b (HCC)   Iron (Fe) deficiency anemia   Ischemic cardiomyopathy   Mixed hyperlipidemia due to type 2 diabetes mellitus (HCC)   Dyspnea on exertion   GERD without esophagitis   Acute renal failure superimposed on stage 3b chronic kidney disease ([place])    1. Acute decompensation of systolic heart failure/ ischemic cardiomyopathy. Echocardiogram with EF 20 to 25 % with global hypokinesis, with akinesis of the apex, RV systolic function preserved. Moderate to severe mitral regurgitation.   Improved volume but not yet back to baseline, continue to have lower extremity edema His urine out put over last 24 hrs is 1700 ml.  Blood pressure systolic 97 to 123XX123 mmHg.   Diuresis with furosemide 40 mg IV q12 h,. Continue beta blockade with carvedilol, and RAAS blockade with Entresto.   2. Symptomatic iron deficiency anemia/ anemia of chronic disease. No clinical signs of active bleeding, recent EGD with no upper GI bleed.  Iron is 33, transferrin saturation is 12, TIBC 283 and ferritin is 126,   Today with stable hgb at 7,9 and Hct at 25,5. No indication for PRBC transfusion at this point in time.   Continue with IV iron, plan to follow iron level as outpatient.   3. CKD stage 3b/ hypokalemia/ hyponatremia.   Serum cr today at 1,63 with K at 3,3 and bicarbonate at 26.  Continue with aggressive diuresis with furosemide, avoid hypotension and nephrotoxic medications.  Today had K correction with total of 80 meq of Kcl. Follow Mg in am.   Anemia of chronic renal disease, once Iron stores replenish consider erythropoietin injections.   4. T2DM with dyslipidemia. Fasting glucose this am is 113, will continue glucose cover and monitoring with  insulin sliding scale.  On ezetimibe and atorvastatin.   5. GERD. On antiacid therapy.   6. Sinusitis.  Continue with bid flonase.      Patient continue to be at high risk for worsening heart failure   Status is: Inpatient  Remains inpatient appropriate because:IV treatments appropriate due to intensity of illness or inability to take PO   Dispo: The patient is from: Home              Anticipated d/c is to: Home              Patient currently is not medically stable to d/c.   Difficult to place patient No   DVT prophylaxis: Enoxaparin   Code Status:   full  Family Communication:  No family at the bedside      Subjective: Patient with improvement of dyspnea but not yet back to baseline, continue to have lower extremity edema, no nausea or vomiting, no chest pain.   Objective: Vitals:   11/13/20 0200 11/13/20 0551 11/13/20 0736 11/13/20 1117  BP:  1'11/70 97/68 94/71 '$  Pulse:  (!) 102 99 96  Resp: '20 18 18 20  '$ Temp:  98.2 F (36.8 C) 98 F (36.7 C) 97.7 F (36.5 C)  TempSrc:  Oral Oral Oral  SpO2: 99% 100% 100% 100%  Weight:  77 kg    Height:        Intake/Output Summary (Last 24 hours) at 11/13/2020 1337 Last data filed at 11/13/2020 1315 Gross per 24 hour  Intake 1560 ml  Output 1100 ml  Net 460 ml   Filed Weights   11/11/20 1940 11/12/20 0504 11/13/20 0551  Weight: 76.7 kg 76 kg 77 kg    Examination:   General: Not in pain, deconditioned  Neurology: Awake and alert, non focal  E ENT: mild pallor, no icterus, oral mucosa moist Cardiovascular: No JVD. S1-S2 present, rhythmic, no gallops, rubs, or murmurs. No lower extremity edema. Pulmonary: positive breath sounds bilaterally, with  no wheezing, or rhonchi, scattered rales. Gastrointestinal. Abdomen soft and non tender Skin. No rashes Musculoskeletal: no joint deformities     Data Reviewed: I have personally reviewed following labs and imaging studies  CBC: Recent Labs  Lab 11/10/20 1255 11/11/20 2151 11/12/20 0253 11/12/20 0851 11/13/20 0333  WBC 6.2 7.5 7.9 6.4  --   NEUTROABS  --  5.9  --   --   --   HGB 7.8* 8.9*  8.6* 8.0* 7.9*  HCT 26.3* 30.0* 26.9* 26.2* 25.5*  MCV 80 81.7 80.3 80.4  --   PLT 227 250 284 233  --    Basic Metabolic Panel: Recent Labs  Lab 11/07/20 1016 11/11/20 2151 11/12/20 0253 11/13/20 0333  NA 137  --  137 133*  K 4.1  --  4.0 3.3*  CL 100  --  101 100  CO2 20  --  27 26  GLUCOSE 150*  --  95 113*  BUN 24  --  28* 25*  CREATININE 1.69*  --  1.76* 1.63*  CALCIUM 9.2  --  9.4 8.9  MG  --  2.0  --   --    GFR: Estimated Creatinine Clearance: 40 mL/min (A) (by C-G formula based on SCr of 1.63 mg/dL (H)). Liver Function Tests: Recent Labs  Lab 11/11/20 2151  AST 31  ALT 25  ALKPHOS 55  BILITOT 1.4*  PROT 7.1  ALBUMIN 3.6   No results for input(s): LIPASE, AMYLASE in the last  168 hours. No results for input(s): AMMONIA in the last 168 hours. Coagulation Profile: Recent Labs  Lab 11/12/20 0253  INR 1.2   Cardiac Enzymes: No results for input(s): CKTOTAL, CKMB, CKMBINDEX, TROPONINI in the last 168 hours. BNP (last 3 results) Recent Labs    10/24/20 1119  PROBNP 1,685*   HbA1C: Recent Labs    11/12/20 0253  HGBA1C 5.7*   CBG: Recent Labs  Lab 11/12/20 1211 11/12/20 1603 11/12/20 2112 11/13/20 0606 11/13/20 1116  GLUCAP 145* 126* 140* 109* 221*   Lipid Profile: No results for input(s): CHOL, HDL, LDLCALC, TRIG, CHOLHDL, LDLDIRECT in the last 72 hours. Thyroid Function Tests: Recent Labs    11/11/20 2158  TSH 3.685   Anemia Panel: Recent Labs    11/11/20 2151 11/11/20 2157 11/11/20 2158  VITAMINB12 605  --   --   FOLATE  --  28.7  --   RETICCTPCT  --   --  3.1      Radiology Studies: I have reviewed all of the imaging during this hospital visit personally     Scheduled Meds: . atorvastatin  40 mg Oral Daily  . carvedilol  12.5 mg Oral BID WC  . ezetimibe  10 mg Oral Daily  . fluticasone  2 spray Each Nare BID  . furosemide  40 mg Intravenous BID  . insulin aspart  0-15 Units Subcutaneous TID AC & HS  . multivitamin  with minerals  1 tablet Oral Daily  . omega-3 acid ethyl esters  1 g Oral Daily  . pantoprazole  40 mg Oral Daily  . sacubitril-valsartan  0.5 tablet Oral BID  . sodium chloride flush  3 mL Intravenous Q12H   Continuous Infusions: . sodium chloride    . ferric gluconate (FERRLECIT/NULECIT) IV 125 mg (11/13/20 0940)     LOS: 2 days        Christorpher Hisaw Gerome Apley, MD

## 2020-11-13 NOTE — Plan of Care (Signed)
  Problem: Activity: Goal: Capacity to carry out activities will improve Outcome: Progressing   Problem: Coping: Goal: Level of anxiety will decrease Outcome: Progressing   Problem: Safety: Goal: Ability to remain free from injury will improve Outcome: Progressing   

## 2020-11-14 ENCOUNTER — Other Ambulatory Visit: Payer: Self-pay

## 2020-11-14 DIAGNOSIS — I5043 Acute on chronic combined systolic (congestive) and diastolic (congestive) heart failure: Secondary | ICD-10-CM | POA: Diagnosis not present

## 2020-11-14 DIAGNOSIS — I2510 Atherosclerotic heart disease of native coronary artery without angina pectoris: Secondary | ICD-10-CM | POA: Diagnosis not present

## 2020-11-14 DIAGNOSIS — N1832 Chronic kidney disease, stage 3b: Secondary | ICD-10-CM | POA: Diagnosis not present

## 2020-11-14 DIAGNOSIS — K219 Gastro-esophageal reflux disease without esophagitis: Secondary | ICD-10-CM | POA: Diagnosis not present

## 2020-11-14 LAB — BASIC METABOLIC PANEL
Anion gap: 8 (ref 5–15)
BUN: 31 mg/dL — ABNORMAL HIGH (ref 8–23)
CO2: 24 mmol/L (ref 22–32)
Calcium: 8.7 mg/dL — ABNORMAL LOW (ref 8.9–10.3)
Chloride: 99 mmol/L (ref 98–111)
Creatinine, Ser: 1.66 mg/dL — ABNORMAL HIGH (ref 0.61–1.24)
GFR, Estimated: 44 mL/min — ABNORMAL LOW (ref >60)
Glucose, Bld: 82 mg/dL (ref 70–99)
Potassium: 4.1 mmol/L (ref 3.5–5.1)
Sodium: 131 mmol/L — ABNORMAL LOW (ref 135–145)

## 2020-11-14 LAB — GLUCOSE, CAPILLARY
Glucose-Capillary: 77 mg/dL (ref 70–99)
Glucose-Capillary: 298 mg/dL — ABNORMAL HIGH (ref 70–99)
Glucose-Capillary: 161 mg/dL — ABNORMAL HIGH (ref 70–99)
Glucose-Capillary: 151 mg/dL — ABNORMAL HIGH (ref 70–99)

## 2020-11-14 LAB — MAGNESIUM: Magnesium: 1.8 mg/dL (ref 1.7–2.4)

## 2020-11-14 MED ORDER — FUROSEMIDE 10 MG/ML IJ SOLN: 60.0000 mg | Freq: Two times a day (BID) | INTRAMUSCULAR | Status: DC

## 2020-11-14 MED ORDER — MAGNESIUM SULFATE 2 GM/50ML IV SOLN
2.0000 g | Freq: Once | INTRAVENOUS | Status: AC
  Administered 2020-11-14: 2 g via INTRAVENOUS
  Filled 2020-11-14: qty 50

## 2020-11-14 NOTE — Progress Notes (Signed)
Heart Failure Stewardship Pharmacist Progress Note   PCP: Lawerance Cruel, MD PCP-Cardiologist: Sinclair Grooms, MD    HPI:  70 yo M with PMH of T2DM, CAD, ICM, CHF, CKD III, HTN, HLD, and GERD. He presented to [facility] on 11/11/20 as a direct admit for shortness of breath and anemia. Also notably with orthopnea and fatigue. Hgb had decreased from 8.9 to 7.8 prior to admission and iron studies revealed TSAT 12, ferritin 126, and iron 33. An ECHO was done on 11/12/20 and LVEF was 20-25% (down from 35-40% in August 2019).  Current HF Medications: Furosemide 60 mg IV BID Carvedilol 12.5 mg BID Entresto 49/51 mg BID  Prior to admission HF Medications: Furosemide 40 mg daily Carvedilol 12.5 mg BID Entresto 49/51 mg BID  Pertinent Lab Values: . Serum creatinine 1.66, BUN 31, Potassium 4.1, Sodium 131, BNP 1728.5, Magnesium 1.8  Vital Signs: . Weight: 171 lbs (admission weight: 169 lbs) . Blood pressure: 90/60s . Heart rate: 90s   Medication Assistance / Insurance Benefits Check: Does the patient have prescription insurance?  Yes Type of insurance plan: [employer] Medicare  Does the patient qualify for medication assistance through manufacturers or grants?   Pending . Eligible grants and/or patient assistance programs: pending . Medication assistance applications in progress: none  . Medication assistance applications approved: none Approved medication assistance renewals will be completed by: pending  Outpatient Pharmacy:  [facility] to [facility] Is the patient willing to use [place] at discharge? Yes Is the patient willing to transition their outpatient pharmacy to utilize a [facility] outpatient pharmacy?   Pending    Assessment: 1. Acute on chronic systolic CHF (EF 0000000), due to ICM. NYHA class III symptoms. - Furosemide increased to 60 mg IV BID today. Magnesium replaced - Continue carvedilol 12.5 mg BID - Continue Entresto 49/51 mg  BID - Did not tolerate spironolactone in the past due to gynecomastia. Consider starting eplerenone (once BP allows) to rechallenge MRA.   - Did not tolerate Jardiance in the past due to fungal infection. Could consider rechallenging with alternative SGLT2i [place]) with less risk for developing yeast infection if patient is willing to retry and be counseled on monitoring for adverse reactions - Nulecit 125 mg IV daily x 3 doses   Plan: 1) Medication changes recommended at this time: - Continue IV diuresis  2) Patient assistance: - None pending  3)  Education  - To be completed prior to discharge  Kerby Nora, PharmD, BCPS Heart Failure Stewardship Pharmacist Phone [phone]

## 2020-11-14 NOTE — Progress Notes (Signed)
Progress Note  Patient Name: Marc [name] Date of Encounter: 11/14/2020  Primary Cardiologist: Sinclair Grooms, MD   Subjective    Patient notes he is feeling better. Has had BM.  Feels lighter, less hoarse, and has less SOB.  Inpatient Medications    Scheduled Meds: . atorvastatin  40 mg Oral Daily  . carvedilol  12.5 mg Oral BID WC  . enoxaparin (LOVENOX) injection  40 mg Subcutaneous Q24H  . ezetimibe  10 mg Oral Daily  . fluticasone  2 spray Each Nare BID  . furosemide  60 mg Intravenous BID  . insulin aspart  0-15 Units Subcutaneous TID AC & HS  . multivitamin with minerals  1 tablet Oral Daily  . omega-3 acid ethyl esters  1 g Oral Daily  . pantoprazole  40 mg Oral Daily  . polyethylene glycol  17 g Oral Daily  . sacubitril-valsartan  0.5 tablet Oral BID  . sodium chloride flush  3 mL Intravenous Q12H   Continuous Infusions: . sodium chloride    . ferric gluconate (FERRLECIT/NULECIT) IV 125 mg (11/13/20 0940)   PRN Meds: sodium chloride, acetaminophen, guaiFENesin-dextromethorphan, ondansetron (ZOFRAN) IV, sodium chloride flush   Vital Signs    Vitals:   11/13/20 2056 11/14/20 0427 11/14/20 0809 11/14/20 1000  BP: '92/65 95/69 93/65 '$ 90/61  Pulse: 99 95 (!) 106 100  Resp: '20 18 16   '$ Temp: 98.4 F (36.9 C) 99 F (37.2 C) 98.9 F (37.2 C)   TempSrc: Oral Oral Oral   SpO2: 94% 100% 98% 100%  Weight:  78 kg    Height:        Intake/Output Summary (Last 24 hours) at 11/14/2020 1051 Last data filed at 11/14/2020 0800 Gross per 24 hour  Intake 1260 ml  Output 1350 ml  Net -90 ml   Filed Weights   11/12/20 0504 11/13/20 0551 11/14/20 0427  Weight: 76 kg 77 kg 78 kg    Telemetry    Sinus rhythm to sinus tachycardia - Personally Reviewed  ECG    No new last 24 - Personally Reviewed  Physical Exam   GEN: No acute distress.   Neck: No JVD  Cardiac: RRR, II/VI Holosystolic murmur, rubs, or gallops.  Respiratory: Clear to auscultation  bilaterally. GI: Soft, nontender, non-distended but dull to percussion MS: +1 edema; No deformity. Neuro:  Nonfocal  Psych: Normal affect   Labs    Chemistry Recent Labs  Lab 11/11/20 2151 11/12/20 0253 11/13/20 0333 11/14/20 0410  NA  --  137 133* 131*  K  --  4.0 3.3* 4.1  CL  --  101 100 99  CO2  --  '27 26 24  '$ GLUCOSE  --  95 113* 82  BUN  --  28* 25* 31*  CREATININE  --  1.76* 1.63* 1.66*  CALCIUM  --  9.4 8.9 8.7*  PROT 7.1  --   --   --   ALBUMIN 3.6  --   --   --   AST 31  --   --   --   ALT 25  --   --   --   ALKPHOS 55  --   --   --   BILITOT 1.4*  --   --   --   GFRNONAA  --  41* 45* 44*  ANIONGAP  --  '9 7 8     '$ Hematology Recent Labs  Lab 11/11/20 2151 11/11/20 2158 11/12/20 0253 11/12/20 [id number]  11/13/20 0333  WBC 7.5  --  7.9 6.4  --   RBC 3.67* 3.58* 3.35* 3.26*  --   HGB 8.9*  --  8.6* 8.0* 7.9*  HCT 30.0*  --  26.9* 26.2* 25.5*  MCV 81.7  --  80.3 80.4  --   MCH 24.3*  --  25.7* 24.5*  --   MCHC 29.7*  --  32.0 30.5  --   RDW 19.5*  --  19.4* 19.3*  --   PLT 250  --  284 233  --     Cardiac EnzymesNo results for input(s): TROPONINI in the last 168 hours. No results for input(s): TROPIPOC in the last 168 hours.   BNP Recent Labs  Lab 11/11/20 2158  BNP 1,728.5*     DDimer No results for input(s): DDIMER in the last 168 hours.   Radiology    No results found.  Cardiac Studies   HfrEF and moderate to severe MR  Patient Profile     70 y.o. male with longstanding cardiomyopathy with evidence of an extensive anterior wall scar and previously with mild-moderate reduction in left ventricular systolic function is admitted with acute exacerbation of chronic combined systolic and diastolic heart failure in the setting of worsening iron deficiency anemia.  He is found to have further reduction in left ventricular systolic function, new regional wall motion abnormality and moderate to severe mitral insufficiency with probably ischemic  mechanism.  Assessment & Plan    Heart Failure Reduced Ejection Fraction  Moderate to Severe MR IDA - NYHA class III, Stage C, hypervolemic, etiology from ischemia - Diuretic regimen: Increase to 60 IV lasix; we are likely under reporting his outs - Discussed the importance of fluid restriction of < 2 L, salt restriction, and checking daily weights  - Coreg 12.5 mg with no room to increase  - ARNI low dose with no room to increase - Aldactone not tolerated with gynecomastia (could be future eplerenone or fineranone candidate- will re-address when euvolemic) - SGLT2i not tolerate with yeast infections - Device Indications: No need for device at this time; will need outpatient consideration - Day 2/3 of IV iron  Coronary Artery Disease; Obstructive HLD - possibly symptomatic - anatomy: dLCx PCI, D1 PCI in 2011  - ASA held in the setting of new anemia (stable 8.6-7.9) - continue statin, zetia goal LDL < 70 - continue BB as above - when euvolemic will use symptoms and creatinine to decide timing of ischemic evaluation (inpatient vs outpatient)  DM- reasonable glycemic control; no change presently  Diet- Cardiac carb consistent DVT PPx- SCDs ordered Full Code  For questions or updates, please contact [facility] Please consult [email or web address] for contact info under Cardiology/STEMI.      Signed, Werner Lean, MD  11/14/2020, 10:51 AM

## 2020-11-14 NOTE — Progress Notes (Signed)
  Mobility Specialist Criteria Algorithm Info.  SATURATION QUALIFICATIONS: (This note is used to comply with regulatory documentation for home oxygen)  Patient Saturations on Room Air at Rest = 99%  Patient Saturations on Room Air while Ambulating = 100%  Patient Saturations on 0 Liters of oxygen while Ambulating = n/a%  Please briefly explain why patient needs home oxygen:  Mobility Team:  [facility] elevated:Self regulated Activity: Ambulated in hall (In chair before and after ambulation) Range of motion: Active; All extremities Level of assistance: Independent Assistive device: None Minutes sitting in chair:  Minutes stood: 2 minutes Minutes ambulated: 2 minutes Distance ambulated (ft): 240 ft Mobility response: Tolerated well Bed Position: Chair  Patient received sitting in recliner chair willing to participate in mobility this morning. He is independent with ADL's, ambulation and transfers. PTA he was limited with activities due to SOB, explaining that walking up stairs was becoming difficult and very physically demanding. Patient lives in [place] home and has 15 steps to get to bedroom, bathroom, etc. He reported feeling and breathing better since being admitted despite slight hypotension. Patient ambulated in hallway 240 feet independently with steady gait, distance was limited due to hallway traffic. Tolerated ambulating on room air well without the need for supplemental oxygen, saturating 99-100% throughout. Patient doing well without any complaints or incidents and is now sitting back in recliner chair with all needs met.   11/14/2020 12:22 PM

## 2020-11-14 NOTE — Progress Notes (Addendum)
Heart Failure Nurse Navigator Progress Note  PCP: Lawerance Cruel, MD PCP-Cardiologist: Linard Millers., MD Admission Diagnosis: CHF  Admitted from: home with spouse  Presentation:   Marc [name] presented as direct admit from clinic with SOB and anemia. Continues to require oxygen per [place]. Upon interview pt sitting in chair next to bed watching TV. Pt states he has strong social support of his wife and adult daughter.  ECHO/ LVEF: 20-25%. 03/2018: 35-40%  Clinical Course:  Past Medical History:  Diagnosis Date  . Cardiomyopathy ([place])    Left ventricle dysfunction with LVEF 35-40% by echo March 2009  . Chest pain   . Coronary atherosclerosis of native coronary artery    with large anterior myocardial infarction 1997. PTCA LAD. Distal circumflex and first diagonal DES 2011  . Diabetes ([place])   . Heart failure ([place])   . HTN (hypertension)   . Hypercholesteremia   . Hypercholesteremia   . Hyperlipidemia   . Hyperlipidemia   . Psoriasis   . Sciatica    Social History   Socioeconomic History  . Marital status: Married    Spouse name: Luellen Pucker  . Number of children: 1  . Years of education: Not on file  . Highest education level: Bachelor's degree (e.g., BA, AB, BS)  Occupational History  . Occupation: retired  Tobacco Use  . Smoking status: Never Smoker  . Smokeless tobacco: Never Used  Vaping Use  . Vaping Use: Never used  Substance and Sexual Activity  . Alcohol use: Yes    Alcohol/week: 0.0 standard drinks    Comment: occasional  . Drug use: No  . Sexual activity: Not on file  Other Topics Concern  . Not on file  Social History Narrative  . Not on file   Social Determinants of Health   Financial Resource Strain: Low Risk   . Difficulty of Paying Living Expenses: Not hard at all  Food Insecurity: No Food Insecurity  . Worried About Charity fundraiser in the Last Year: Never true  . Ran Out of Food in the Last Year: Never true  Transportation Needs: No  Transportation Needs  . Lack of Transportation (Medical): No  . Lack of Transportation (Non-Medical): No  Physical Activity: Not on file  Stress: Not on file  Social Connections: Not on file   High Risk Criteria for Readmission and/or Poor Patient Outcomes:  Heart failure hospital admissions (last 6 months): 1   No Show rate: 0%  Difficult social situation: no  Demonstrates medication adherence: yes  Primary Language: English  Literacy level: able to read/write and comprehend.  Barriers of Care:   -none  Considerations/Referrals:   Referral made to Heart Failure Pharmacist Stewardship: yes, at bedside Referral made to Heart & Vascular TOC clinic: Pt has follow up appt scheduled with Dr. Tamala Julian for Friday 4/1, if pt still hospitalized and unable to attend appt. Will plan to schedule quick follow up with HV TOC.   Items for Follow-up on DC/TOC: -medication optimization  Pricilla Holm, RN, BSN Heart Failure Nurse Navigator [phone]

## 2020-11-14 NOTE — Plan of Care (Signed)
  Problem: Clinical Measurements: Goal: Respiratory complications will improve Outcome: Progressing   Problem: Safety: Goal: Ability to remain free from injury will improve Outcome: Progressing   

## 2020-11-14 NOTE — Progress Notes (Signed)
   11/14/20 0809  Assess: MEWS Score  Temp 98.9 F (37.2 C)  BP 93/65  Pulse Rate (!) 106  Resp 16  Level of Consciousness Alert  SpO2 98 %  O2 Device Nasal Cannula  O2 Flow Rate (L/min) 3 L/min  Assess: MEWS Score  MEWS Temp 0  MEWS Systolic 1  MEWS Pulse 1  MEWS RR 0  MEWS LOC 0  MEWS Score 2  MEWS Score Color Yellow  Assess: if the MEWS score is Yellow or Red  Were vital signs taken at a resting state? Yes  Focused Assessment No change from prior assessment  Early Detection of Sepsis Score *See Row Information* Low  MEWS guidelines implemented *See Row Information* No, previously yellow, continue vital signs every 4 hours  Document  Patient Outcome Other (Comment)  Progress note created (see row info) Yes   Patient continues to have intermittent tachycardia. Medications have been adjusted due to decrease in BP. Will continue scheduled medications.

## 2020-11-14 NOTE — Progress Notes (Signed)
PROGRESS NOTE    Marc [name]  [id number] DOB: 19-Apr-1951 DOA: 11/11/2020 PCP: Lawerance Cruel, MD    Brief Narrative:  Marc [name] is being consulted for symptomatic anemia.  70 year old male with past medical history for type 2 diabetes mellitus, coronary artery disease, ischemic cardiomyopathy, diastolic heart failure, chronic kidney disease stage III, hypertension, dyslipidemia and GERD who was directly admitted to the cardiology service due to progressive generalized weakness and dyspnea on exertion, concerns for worsening anemia. Patient reported several weeks of generalized weakness, associated with dyspnea, PND, orthopnea,increased abdominal girth and lower extremity edema. Outpatient follow-up with cardiology 3/25 showed hemoglobin 7.8, down from 8.9 couple weeks prior. Outpatient GI work-up 02/22, upper endoscopy negative for upper GI bleed. He has been on oral iron, since then positive black stools. His vital signs at the time of consultation blood pressure 115/76, heart rate 106, respiratory rate 18, temperature 97.6, oxygenation 90%, lungs with showing faint rales bilaterally, heart S1-S2, present, rhythmic, abdomen soft, positive bilateral extremity edema.  White count 7.5, hemoglobin 8.9, hematocrit 30.0, platelets 250. SARS COVID 19 negative. Chest radiograph with mild cardiomegaly, and bilateral vascular congestion, with bilateral interstitial infiltrates.   EKG 108 bpm, left axis deviation with left anterior fascicular block, interventricular conduction delay, sinus rhythm, poor r wave progression, with no significant ST segment or t wave changes.  Positive iron deficiency in combination of anemia of chronic renal disease.  Started on IV iron with good toleration.  Plan for outpatient follow up on iron stores in 2 to 3 weeks.    Assessment & Plan:   Principal Problem:   Acute on chronic combined systolic and diastolic CHF (congestive heart failure)  (HCC) Active Problems:   Cardiomyopathy ([place])   Coronary artery disease involving native coronary artery of native heart without angina pectoris   Type 2 diabetes mellitus with stage 3 chronic kidney disease, without long-term current use of insulin (HCC)   Chronic kidney disease, stage 3b (HCC)   Iron (Fe) deficiency anemia   Ischemic cardiomyopathy   Mixed hyperlipidemia due to type 2 diabetes mellitus (HCC)   Dyspnea on exertion   GERD without esophagitis   Acute renal failure superimposed on stage 3b chronic kidney disease ([place])    1. Acute decompensation of systolic heart failure/ ischemic cardiomyopathy. Echocardiogram with EF 20 to 25 % with global hypokinesis, with akinesis of the apex, RV systolic function preserved. Moderate to severe mitral regurgitation.   Continue to improve volume status but not yet back to baseline, his  urine out put over last 24 hrs is 1350 ml.  Blood pressure systolic 93 to 95 mmHg.   Furosemide has been increased to 60 mg Iv q12 hrs. Continue with carvedilol for beta blockade with carvedilol. On low dose sacubitril/ valsartan (held today due to hypotension)  2. Symptomatic iron deficiency anemia/ anemia of chronic disease. No clinical signs of active bleeding, recent EGD with no upper GI bleed.  Iron is 33, transferrin saturation is 12, TIBC 283 and ferritin is 126,   Tolerated well IV iron infusion. Follow up with iron stores as outpatient. Keep Hgb above 8, check cell count in am.   3. CKD stage 3b/ hypokalemia/ hyponatremia/ hypomagesemia Renal function with serum cr at 1,66 today with K at 4,1 and bicarbonate at 24. NA 131, Mg 1,8.   Continue diuresis with furosemide. Follow renal function in am. Add 2 g mag sulfate today.   Anemia of chronic renal disease, once Iron stores replenish consider  erythropoietin injections.   4. T2DM with dyslipidemia. Fasting glucose this am is 82, tolerating well sliding scale for glucose cover and  monitoring.   Continue with ezetimibe and atorvastatin.   5. GERD. Continue with antiacid therapy.     Status is: Inpatient  Remains inpatient appropriate because:IV treatments appropriate due to intensity of illness or inability to take PO   Dispo: The patient is from: Home              Anticipated d/c is to: Home              Patient currently is not medically stable to d/c.   Difficult to place patient No  DVT prophylaxis: enoxaparin   Code Status:    full Family Communication:  No family at the bedside     Subjective: Patient with improvement in dyspnea and edema, but not yet back to baseline, no nausea or vomiting, no chest pain.   Objective: Vitals:   11/14/20 0427 11/14/20 0809 11/14/20 1000 11/14/20 1130  BP: '95/69 93/65 90/61 '$ (!) 78/56  Pulse: 95 (!) 106 100 91  Resp: '18 16  16  '$ Temp: 99 F (37.2 C) 98.9 F (37.2 C)  98.7 F (37.1 C)  TempSrc: Oral Oral  Oral  SpO2: 100% 98% 100% 100%  Weight: 78 kg     Height:        Intake/Output Summary (Last 24 hours) at 11/14/2020 1321 Last data filed at 11/14/2020 0800 Gross per 24 hour  Intake 840 ml  Output 1350 ml  Net -510 ml   Filed Weights   11/12/20 0504 11/13/20 0551 11/14/20 0427  Weight: 76 kg 77 kg 78 kg    Examination:   General: Not in pain or dyspnea, deconditoned  Neurology: Awake and alert, non focal  E ENT: no pallor, no icterus, oral mucosa moist Cardiovascular: No JVD. S1-S2 present, rhythmic, no gallops, rubs, or murmurs. ++ pitting bilateral lower extremity edema. Pulmonary:positive breath sounds bilaterally, with no wheezing,or  rhonchi but scattered rales. Gastrointestinal. Abdomen soft and non tender Skin. No rashes Musculoskeletal: no joint deformities     Data Reviewed: I have personally reviewed following labs and imaging studies  CBC: Recent Labs  Lab 11/10/20 1255 11/11/20 2151 11/12/20 0253 11/12/20 0851 11/13/20 0333  WBC 6.2 7.5 7.9 6.4  --   NEUTROABS  --   5.9  --   --   --   HGB 7.8* 8.9* 8.6* 8.0* 7.9*  HCT 26.3* 30.0* 26.9* 26.2* 25.5*  MCV 80 81.7 80.3 80.4  --   PLT 227 250 284 233  --    Basic Metabolic Panel: Recent Labs  Lab 11/11/20 2151 11/12/20 0253 11/13/20 0333 11/14/20 0410  NA  --  137 133* 131*  K  --  4.0 3.3* 4.1  CL  --  101 100 99  CO2  --  '27 26 24  '$ GLUCOSE  --  95 113* 82  BUN  --  28* 25* 31*  CREATININE  --  1.76* 1.63* 1.66*  CALCIUM  --  9.4 8.9 8.7*  MG 2.0  --   --  1.8   GFR: Estimated Creatinine Clearance: 39.3 mL/min (A) (by C-G formula based on SCr of 1.66 mg/dL (H)). Liver Function Tests: Recent Labs  Lab 11/11/20 2151  AST 31  ALT 25  ALKPHOS 55  BILITOT 1.4*  PROT 7.1  ALBUMIN 3.6   No results for input(s): LIPASE, AMYLASE in the last 168 hours. No  results for input(s): AMMONIA in the last 168 hours. Coagulation Profile: Recent Labs  Lab 11/12/20 0253  INR 1.2   Cardiac Enzymes: No results for input(s): CKTOTAL, CKMB, CKMBINDEX, TROPONINI in the last 168 hours. BNP (last 3 results) Recent Labs    10/24/20 1119  PROBNP 1,685*   HbA1C: Recent Labs    11/12/20 0253  HGBA1C 5.7*   CBG: Recent Labs  Lab 11/13/20 1116 11/13/20 1624 11/13/20 2138 11/14/20 0629 11/14/20 1127  GLUCAP 221* 153* 125* 77 298*   Lipid Profile: No results for input(s): CHOL, HDL, LDLCALC, TRIG, CHOLHDL, LDLDIRECT in the last 72 hours. Thyroid Function Tests: Recent Labs    11/11/20 2158  TSH 3.685   Anemia Panel: Recent Labs    11/11/20 2151 11/11/20 2157 11/11/20 2158  VITAMINB12 605  --   --   FOLATE  --  28.7  --   RETICCTPCT  --   --  3.1      Radiology Studies: I have reviewed all of the imaging during this hospital visit personally     Scheduled Meds: . atorvastatin  40 mg Oral Daily  . carvedilol  12.5 mg Oral BID WC  . enoxaparin (LOVENOX) injection  40 mg Subcutaneous Q24H  . ezetimibe  10 mg Oral Daily  . fluticasone  2 spray Each Nare BID  . furosemide   60 mg Intravenous BID  . insulin aspart  0-15 Units Subcutaneous TID AC & HS  . multivitamin with minerals  1 tablet Oral Daily  . omega-3 acid ethyl esters  1 g Oral Daily  . pantoprazole  40 mg Oral Daily  . polyethylene glycol  17 g Oral Daily  . sacubitril-valsartan  0.5 tablet Oral BID  . sodium chloride flush  3 mL Intravenous Q12H   Continuous Infusions: . sodium chloride    . magnesium sulfate bolus IVPB       LOS: 3 days        Heily Carlucci Gerome Apley, MD

## 2020-11-15 ENCOUNTER — Encounter (HOSPITAL_COMMUNITY): Payer: Self-pay | Admitting: Internal Medicine

## 2020-11-15 ENCOUNTER — Other Ambulatory Visit (HOSPITAL_COMMUNITY): Payer: HMO

## 2020-11-15 DIAGNOSIS — I5043 Acute on chronic combined systolic (congestive) and diastolic (congestive) heart failure: Secondary | ICD-10-CM | POA: Diagnosis not present

## 2020-11-15 LAB — BASIC METABOLIC PANEL
Anion gap: 11 (ref 5–15)
BUN: 42 mg/dL — ABNORMAL HIGH (ref 8–23)
CO2: 22 mmol/L (ref 22–32)
Calcium: 8.8 mg/dL — ABNORMAL LOW (ref 8.9–10.3)
Chloride: 95 mmol/L — ABNORMAL LOW (ref 98–111)
Creatinine, Ser: 1.9 mg/dL — ABNORMAL HIGH (ref 0.61–1.24)
GFR, Estimated: 38 mL/min — ABNORMAL LOW (ref >60)
Glucose, Bld: 108 mg/dL — ABNORMAL HIGH (ref 70–99)
Potassium: 4.3 mmol/L (ref 3.5–5.1)
Sodium: 128 mmol/L — ABNORMAL LOW (ref 135–145)

## 2020-11-15 LAB — CBC WITH DIFFERENTIAL/PLATELET
Abs Immature Granulocytes: 0.02 10*3/uL (ref 0.00–0.07)
Basophils Absolute: 0 10*3/uL (ref 0.0–0.1)
Basophils Relative: 0 %
Eosinophils Absolute: 0.1 10*3/uL (ref 0.0–0.5)
Eosinophils Relative: 2 %
HCT: 24.3 % — ABNORMAL LOW (ref 39.0–52.0)
Hemoglobin: 7.7 g/dL — ABNORMAL LOW (ref 13.0–17.0)
Immature Granulocytes: 0 %
Lymphocytes Relative: 12 %
Lymphs Abs: 0.7 10*3/uL (ref 0.7–4.0)
MCH: 24.9 pg — ABNORMAL LOW (ref 26.0–34.0)
MCHC: 31.7 g/dL (ref 30.0–36.0)
MCV: 78.6 fL — ABNORMAL LOW (ref 80.0–100.0)
Monocytes Absolute: 0.4 10*3/uL (ref 0.1–1.0)
Monocytes Relative: 7 %
Neutro Abs: 4.4 10*3/uL (ref 1.7–7.7)
Neutrophils Relative %: 79 %
Platelets: 197 10*3/uL (ref 150–400)
RBC: 3.09 MIL/uL — ABNORMAL LOW (ref 4.22–5.81)
RDW: 19.2 % — ABNORMAL HIGH (ref 11.5–15.5)
WBC: 5.6 10*3/uL (ref 4.0–10.5)
nRBC: 0 % (ref 0.0–0.2)

## 2020-11-15 LAB — GLUCOSE, CAPILLARY
Glucose-Capillary: 104 mg/dL — ABNORMAL HIGH (ref 70–99)
Glucose-Capillary: 288 mg/dL — ABNORMAL HIGH (ref 70–99)
Glucose-Capillary: 169 mg/dL — ABNORMAL HIGH (ref 70–99)
Glucose-Capillary: 188 mg/dL — ABNORMAL HIGH (ref 70–99)

## 2020-11-15 MED ORDER — SACUBITRIL-VALSARTAN 49-51 MG PO TABS: 0.5000 | ORAL_TABLET | Freq: Two times a day (BID) | ORAL | Status: DC

## 2020-11-15 MED ORDER — FUROSEMIDE 40 MG PO TABS
40.0000 mg | ORAL_TABLET | Freq: Every day | ORAL | Status: DC
  Administered 2020-11-15: 40 mg via ORAL
  Filled 2020-11-15: qty 1

## 2020-11-15 NOTE — Progress Notes (Signed)
  Mobility Specialist Criteria Algorithm Info.  Mobility Team: [facility] elevated:Self regulated Activity: Ambulated in hall; Ambulated in room Range of motion: Active; All extremities Level of assistance: Independent Assistive device: None Minutes sitting in chair:  Minutes stood: 5 minutes Minutes ambulated: 5 minutes Distance ambulated (ft): 380 ft Mobility response: Tolerated well Bed Position: Chair  Patient up ambulating in room upon entry. Agreed to participate in mobility this morning. Ambulated in hallway 380 feet with steady gait. Tolerated ambulation well without complaint or incident and is now dangling EOB with all needs met.   11/15/2020 11:14 AM

## 2020-11-15 NOTE — Progress Notes (Signed)
Progress Note  Patient Name: Marc [name] Date of Encounter: 11/15/2020  Primary Cardiologist: Sinclair Grooms, MD   Subjective   Patient has one episodes of asymptomatic hypotension 11/14/20.  This PM has 1 run on NSVT.  Asymptomatic.  Had his IV lasix and entresto held.  Feels like his breathing has improved.  Inpatient Medications    Scheduled Meds: . atorvastatin  40 mg Oral Daily  . carvedilol  12.5 mg Oral BID WC  . enoxaparin (LOVENOX) injection  40 mg Subcutaneous Q24H  . ezetimibe  10 mg Oral Daily  . fluticasone  2 spray Each Nare BID  . furosemide  40 mg Oral Daily  . insulin aspart  0-15 Units Subcutaneous TID AC & HS  . multivitamin with minerals  1 tablet Oral Daily  . omega-3 acid ethyl esters  1 g Oral Daily  . pantoprazole  40 mg Oral Daily  . polyethylene glycol  17 g Oral Daily  . [START ON 11/16/2020] sacubitril-valsartan  0.5 tablet Oral BID  . sodium chloride flush  3 mL Intravenous Q12H   Continuous Infusions: . sodium chloride     PRN Meds: sodium chloride, acetaminophen, guaiFENesin-dextromethorphan, ondansetron (ZOFRAN) IV, sodium chloride flush   Vital Signs    Vitals:   11/14/20 1825 11/14/20 2051 11/15/20 0431 11/15/20 0900  BP: (!) 80/54 (!) 84/58 101/69 97/66  Pulse:  97 88 90  Resp:  18 (!) 22 16  Temp:  98.1 F (36.7 C) 98.1 F (36.7 C) 97.9 F (36.6 C)  TempSrc:  Oral Oral Oral  SpO2:  100% 100% 99%  Weight:   79.8 kg   Height:        Intake/Output Summary (Last 24 hours) at 11/15/2020 1311 Last data filed at 11/15/2020 0900 Gross per 24 hour  Intake 1130.06 ml  Output 1500 ml  Net -369.94 ml   Filed Weights   11/13/20 0551 11/14/20 0427 11/15/20 0431  Weight: 77 kg 78 kg 79.8 kg    Telemetry    Sinus rhythm to sinus tachycardia - Personally Reviewed  ECG    No new last 24 - Personally Reviewed  Physical Exam   GEN: No acute distress.   Neck: No JVD  Cardiac: RRR, II/VI Holosystolic murmur, rubs, or  gallops.  Respiratory: Clear to auscultation bilaterally. GI: Soft, nontender, non-distended but dull to percussion MS: +1 edema; No deformity. Neuro:  Nonfocal  Psych: Normal affect   Labs    Chemistry Recent Labs  Lab 11/11/20 2151 11/12/20 0253 11/13/20 0333 11/14/20 0410 11/15/20 0535  NA  --    < > 133* 131* 128*  K  --    < > 3.3* 4.1 4.3  CL  --    < > 100 99 95*  CO2  --    < > '26 24 22  '$ GLUCOSE  --    < > 113* 82 108*  BUN  --    < > 25* 31* 42*  CREATININE  --    < > 1.63* 1.66* 1.90*  CALCIUM  --    < > 8.9 8.7* 8.8*  PROT 7.1  --   --   --   --   ALBUMIN 3.6  --   --   --   --   AST 31  --   --   --   --   ALT 25  --   --   --   --   [facility]  55  --   --   --   --   BILITOT 1.4*  --   --   --   --   GFRNONAA  --    < > 45* 44* 38*  ANIONGAP  --    < > '7 8 11   '$ < > = values in this interval not displayed.     Hematology Recent Labs  Lab 11/12/20 0253 11/12/20 0851 11/13/20 0333 11/15/20 0535  WBC 7.9 6.4  --  5.6  RBC 3.35* 3.26*  --  3.09*  HGB 8.6* 8.0* 7.9* 7.7*  HCT 26.9* 26.2* 25.5* 24.3*  MCV 80.3 80.4  --  78.6*  MCH 25.7* 24.5*  --  24.9*  MCHC 32.0 30.5  --  31.7  RDW 19.4* 19.3*  --  19.2*  PLT 284 233  --  197    Cardiac EnzymesNo results for input(s): TROPONINI in the last 168 hours. No results for input(s): TROPIPOC in the last 168 hours.   BNP Recent Labs  Lab 11/11/20 2158  BNP 1,728.5*     DDimer No results for input(s): DDIMER in the last 168 hours.   Radiology    No results found.  Cardiac Studies   HfrEF and moderate to severe MR  Patient Profile     70 y.o. male with longstanding cardiomyopathy with evidence of an extensive anterior wall scar and previously with mild-moderate reduction in left ventricular systolic function is admitted with acute exacerbation of chronic combined systolic and diastolic heart failure in the setting of worsening iron deficiency anemia.  He is found to have further reduction in left  ventricular systolic function, new regional wall motion abnormality and moderate to severe mitral insufficiency with probably ischemic mechanism.  Assessment & Plan    Heart Failure Reduced Ejection Fraction  Moderate to Severe MR IDA - NYHA class III, Stage C, unclear volume status, etiology from ischemia - Diuretic regimen: trial of 40 mg PO Lasix daily - Discussed the importance of fluid restriction of < 2 L, salt restriction, and checking daily weights  - Coreg 12.5 mg with no room to increase  - ARNI low dose with no room to increase - Aldactone not tolerated with gynecomastia (could be future eplerenone or fineranone candidate- will re-address when euvolemic) - SGLT2i not tolerate with yeast infections; outpatient restart is recommended - Device Indications: No need for device at this time; will need outpatient consideration - Day 3/3 of IV iron - if unable to wean off oxygen we will attempt RHC 11/16/20  Coronary Artery Disease; Obstructive HLD - likely asymptomatic - anatomy: dLCx PCI, D1 PCI in 2011  - ASA held in the setting of new anemia (stable 8.6-7.9) - continue statin, zetia goal LDL < 70 - continue BB as above - planned for outpatient evaluation  DM- reasonable glycemic control; no change presently  Diet- Cardiac carb consistent DVT PPx- SCDs ordered Full Code  Discussed with patient, nursing, and his primary cardiologist  For questions or updates, please contact [facility] Please consult [email or web address] for contact info under Cardiology/STEMI.      Signed, Werner Lean, MD  11/15/2020, 1:11 PM

## 2020-11-15 NOTE — Progress Notes (Signed)
CCMD notified RN about patient having 10 beat run of [place]. RN rounded and assessed patient, patient verbalized he was getting up from bed and walking to the chair to have lunch. Patient is alert and oriented and expresses no pain. Will continue to monitor.

## 2020-11-15 NOTE — Progress Notes (Addendum)
Heart Failure Stewardship Pharmacist Progress Note   PCP: Lawerance Cruel, MD PCP-Cardiologist: Sinclair Grooms, MD    HPI:  70 yo M with PMH of T2DM, CAD, ICM, CHF, CKD III, HTN, HLD, and GERD. He presented to [facility] on 11/11/20 as a direct admit for shortness of breath and anemia. Also notably with orthopnea and fatigue. Hgb had decreased from 8.9 to 7.8 prior to admission and iron studies revealed TSAT 12, ferritin 126, and iron 33. An ECHO was done on 11/12/20 and LVEF was 20-25% (down from 35-40% in August 2019).  Current HF Medications: Carvedilol 12.5 mg BID Entresto 49/51 mg BID  Prior to admission HF Medications: Furosemide 40 mg daily Carvedilol 12.5 mg BID Entresto 49/51 mg BID  Pertinent Lab Values: . Serum creatinine 1.66>1.90, BUN 42, Potassium 4.3, Sodium 128, BNP 1728.5, Magnesium 1.8  Vital Signs: . Weight: 175 lbs (admission weight: 169 lbs) . Blood pressure: 90-100/60s . Heart rate: 80-90s   Medication Assistance / Insurance Benefits Check: Does the patient have prescription insurance?  Yes Type of insurance plan: [employer]  Does the patient qualify for medication assistance through manufacturers or grants?   Pending . Eligible grants and/or patient assistance programs: pending . Medication assistance applications in progress: none  . Medication assistance applications approved: none Approved medication assistance renewals will be completed by: pending  Outpatient Pharmacy:  Prior to admission outpatient [facility]: [facility] Is the patient willing to use [facility] at discharge? Yes Is the patient willing to transition their outpatient pharmacy to utilize a [facility] outpatient pharmacy?   No     Assessment: 1. Acute on chronic systolic CHF (EF 0000000), due to ICM. NYHA class II symptoms. - IV lasix off today per cardiology - Continue carvedilol 12.5 mg BID - Continue Entresto 49/51 mg BID - Did not tolerate spironolactone in the  past due to gynecomastia. Consider starting eplerenone (once BP allows) to rechallenge MRA.   - Did not tolerate Jardiance in the past due to fungal infection. Discussed with patient about rechallenging with alternative SGLT2i [place]) with less risk for developing yeast infection. Will discuss with Dr. Tamala Julian. - Nulecit 125 mg IV daily x 3 doses (completed)  Plan: 1) Medication changes recommended at this time: - Consider adding Farxiga 10 mg daily (discussed with patient today)  2) Patient assistance: - Delene Loll copay $0 - Farxiga requires prior authorization  3)  Education  - Patient has been educated on current HF medications and potential additions to HF medication regimen - Patient verbalizes understanding that over the next few months, these medication doses may change and more medications may be added to optimize HF regimen - Patient has been educated on basic disease state pathophysiology and goals of therapy - Time spent (30 mins)  Kerby Nora, PharmD, BCPS Heart Failure Stewardship Pharmacist Phone [phone]

## 2020-11-15 NOTE — Progress Notes (Signed)
Patient with improvement of dyspnea, constipation resolved.  Continue to have lower extremity edema. No melena or hematochezia.   Vital signs, temperature 97.9, blood pressure 97/66, heart rate 90, respiratory rate 16, oxygen saturation 99% on 2 L of supplemental oxygen per nasal cannula.  His lungs had no rales or wheezing, heart S1-S2, present, rhythmic, abdomen soft nontender, positive +/++ pitting bilateral extremity edema  Follow-up hemoglobin 7.7, hematocrit 24.3.  For his anemia of chronic disease combined with iron deficiency, he has received 3 doses of IV iron, his hemoglobin today 7.7, hematocrit 24.3.  Considering improvement of his dyspnea, will hold PRBC transfusion. Recommendations for outpatient follow-up iron stores in 2 to 3 weeks.  Patient may benefit from erythropoietin injection as an outpatient.  Heart failure per primary team.  Will sign off, please call with questions.

## 2020-11-15 NOTE — Plan of Care (Signed)
  Problem: Education: Goal: Knowledge of General Education information will improve Description: Including pain rating scale, medication(s)/side effects and non-pharmacologic comfort measures Outcome: Progressing   Problem: Safety: Goal: Ability to remain free from injury will improve Outcome: Progressing   

## 2020-11-15 NOTE — Progress Notes (Signed)
Held evening dose of carvedilol due to SBP trending around 86-88. Cycled BP q 15 min for three rounds. Patient is asymptomatic, sitting in chair next to the bed with wife and daughter at bedside. Paged Arrien, MD &  Dorene Ar, NP to notify.

## 2020-11-15 NOTE — Progress Notes (Signed)
Called by RN, pt's BP low, pt asymptomatic, sitting up in chair, I asked for bedrest for pt.  Pt rec'd lasix po about 2 hours ago.  Will hold coreg for now  Pt has similar issue yesterday.

## 2020-11-16 ENCOUNTER — Inpatient Hospital Stay: Payer: Self-pay

## 2020-11-16 ENCOUNTER — Encounter (HOSPITAL_COMMUNITY)
Admission: AD | Disposition: A | Discharge status: Home Health | Payer: Self-pay | Source: Ambulatory Visit | Attending: Internal Medicine

## 2020-11-16 DIAGNOSIS — E785 Hyperlipidemia, unspecified: Secondary | ICD-10-CM

## 2020-11-16 DIAGNOSIS — I5023 Acute on chronic systolic (congestive) heart failure: Secondary | ICD-10-CM

## 2020-11-16 DIAGNOSIS — I255 Ischemic cardiomyopathy: Secondary | ICD-10-CM

## 2020-11-16 DIAGNOSIS — I340 Nonrheumatic mitral (valve) insufficiency: Secondary | ICD-10-CM

## 2020-11-16 DIAGNOSIS — I2510 Atherosclerotic heart disease of native coronary artery without angina pectoris: Secondary | ICD-10-CM | POA: Diagnosis not present

## 2020-11-16 DIAGNOSIS — I5043 Acute on chronic combined systolic (congestive) and diastolic (congestive) heart failure: Secondary | ICD-10-CM | POA: Diagnosis not present

## 2020-11-16 HISTORY — PX: RIGHT HEART CATH: CATH118263

## 2020-11-16 LAB — CBC
HCT: 26.3 % — ABNORMAL LOW (ref 39.0–52.0)
Hemoglobin: 7.9 g/dL — ABNORMAL LOW (ref 13.0–17.0)
MCH: 24.2 pg — ABNORMAL LOW (ref 26.0–34.0)
MCHC: 30 g/dL (ref 30.0–36.0)
MCV: 80.7 fL (ref 80.0–100.0)
Platelets: 253 10*3/uL (ref 150–400)
RBC: 3.26 MIL/uL — ABNORMAL LOW (ref 4.22–5.81)
RDW: 18.7 % — ABNORMAL HIGH (ref 11.5–15.5)
WBC: 6.4 10*3/uL (ref 4.0–10.5)
nRBC: 0 % (ref 0.0–0.2)

## 2020-11-16 LAB — GLUCOSE, CAPILLARY
Glucose-Capillary: 174 mg/dL — ABNORMAL HIGH (ref 70–99)
Glucose-Capillary: 200 mg/dL — ABNORMAL HIGH (ref 70–99)
Glucose-Capillary: 167 mg/dL — ABNORMAL HIGH (ref 70–99)
Glucose-Capillary: 244 mg/dL — ABNORMAL HIGH (ref 70–99)

## 2020-11-16 LAB — POCT I-STAT EG7
Acid-base deficit: 1 mmol/L (ref 0.0–2.0)
Acid-base deficit: 1 mmol/L (ref 0.0–2.0)
Bicarbonate: 24.1 mmol/L (ref 20.0–28.0)
Bicarbonate: 24.5 mmol/L (ref 20.0–28.0)
Calcium, Ion: 1.2 mmol/L (ref 1.15–1.40)
Calcium, Ion: 1.21 mmol/L (ref 1.15–1.40)
HCT: 24 % — ABNORMAL LOW (ref 39.0–52.0)
HCT: 25 % — ABNORMAL LOW (ref 39.0–52.0)
Hemoglobin: 8.2 g/dL — ABNORMAL LOW (ref 13.0–17.0)
Hemoglobin: 8.5 g/dL — ABNORMAL LOW (ref 13.0–17.0)
O2 Saturation: 46 %
O2 Saturation: 50 %
Potassium: 4.2 mmol/L (ref 3.5–5.1)
Potassium: 4.2 mmol/L (ref 3.5–5.1)
Sodium: 130 mmol/L — ABNORMAL LOW (ref 135–145)
Sodium: 131 mmol/L — ABNORMAL LOW (ref 135–145)
TCO2: 25 mmol/L (ref 22–32)
TCO2: 26 mmol/L (ref 22–32)
pCO2, Ven: 43.2 mmHg — ABNORMAL LOW (ref 44.0–60.0)
pCO2, Ven: 43.4 mmHg — ABNORMAL LOW (ref 44.0–60.0)
pH, Ven: 7.355 (ref 7.250–7.430)
pH, Ven: 7.359 (ref 7.250–7.430)
pO2, Ven: 26 mmHg — CL (ref 32.0–45.0)
pO2, Ven: 28 mmHg — CL (ref 32.0–45.0)

## 2020-11-16 LAB — BASIC METABOLIC PANEL
Anion gap: 9 (ref 5–15)
BUN: 45 mg/dL — ABNORMAL HIGH (ref 8–23)
CO2: 24 mmol/L (ref 22–32)
Calcium: 8.8 mg/dL — ABNORMAL LOW (ref 8.9–10.3)
Chloride: 94 mmol/L — ABNORMAL LOW (ref 98–111)
Creatinine, Ser: 1.85 mg/dL — ABNORMAL HIGH (ref 0.61–1.24)
GFR, Estimated: 39 mL/min — ABNORMAL LOW (ref >60)
Glucose, Bld: 148 mg/dL — ABNORMAL HIGH (ref 70–99)
Potassium: 4.5 mmol/L (ref 3.5–5.1)
Sodium: 127 mmol/L — ABNORMAL LOW (ref 135–145)

## 2020-11-16 SURGERY — RIGHT HEART CATH

## 2020-11-16 MED ORDER — SODIUM CHLORIDE 0.9% FLUSH
3.0000 mL | Freq: Two times a day (BID) | INTRAVENOUS | Status: DC
  Administered 2020-11-17 – 2020-11-21 (×3): 3 mL via INTRAVENOUS

## 2020-11-16 MED ORDER — HEPARIN (PORCINE) IN NACL 1000-0.9 UT/500ML-% IV SOLN
INTRAVENOUS | Status: DC | PRN
  Administered 2020-11-16: 500 mL

## 2020-11-16 MED ORDER — SODIUM CHLORIDE 0.9 % IV SOLN: 250.0000 mL | INTRAVENOUS | Status: DC | PRN

## 2020-11-16 MED ORDER — METOPROLOL SUCCINATE ER 50 MG PO TB24: 50.0000 mg | ORAL_TABLET | Freq: Every day | ORAL | Status: DC

## 2020-11-16 MED ORDER — LIDOCAINE HCL (PF) 1 % IJ SOLN
INTRAMUSCULAR | Status: DC | PRN
  Administered 2020-11-16: 2 mL
  Administered 2020-11-16: 3 mL

## 2020-11-16 MED ORDER — SODIUM CHLORIDE 0.9% FLUSH: 3.0000 mL | INTRAVENOUS | Status: DC | PRN

## 2020-11-16 MED ORDER — MIDAZOLAM HCL 2 MG/2ML IJ SOLN
INTRAMUSCULAR | Status: AC
  Filled 2020-11-16: qty 2

## 2020-11-16 MED ORDER — SODIUM CHLORIDE 0.9% FLUSH: 3.0000 mL | Freq: Two times a day (BID) | INTRAVENOUS | Status: DC

## 2020-11-16 MED ORDER — SODIUM CHLORIDE 0.9 % IV SOLN: INTRAVENOUS | Status: DC

## 2020-11-16 MED ORDER — ACETAMINOPHEN 325 MG PO TABS: 650.0000 mg | ORAL_TABLET | ORAL | Status: DC | PRN

## 2020-11-16 MED ORDER — ONDANSETRON HCL 4 MG/2ML IJ SOLN: 4.0000 mg | Freq: Four times a day (QID) | INTRAMUSCULAR | Status: DC | PRN

## 2020-11-16 MED ORDER — LIDOCAINE HCL (PF) 1 % IJ SOLN
INTRAMUSCULAR | Status: AC
  Filled 2020-11-16: qty 30

## 2020-11-16 MED ORDER — CALCIUM CARBONATE ANTACID 500 MG PO CHEW
1.0000 | CHEWABLE_TABLET | Freq: Two times a day (BID) | ORAL | Status: DC
  Administered 2020-11-16 – 2020-12-05 (×37): 200 mg via ORAL
  Filled 2020-11-16 (×37): qty 1

## 2020-11-16 MED ORDER — DOBUTAMINE IN D5W 4-5 MG/ML-% IV SOLN
4.0000 ug/kg/min | INTRAVENOUS | Status: DC
  Administered 2020-11-16: 2.5 ug/kg/min via INTRAVENOUS
  Administered 2020-11-19: 4 ug/kg/min via INTRAVENOUS
  Administered 2020-11-20 – 2020-11-25 (×3): 3 ug/kg/min via INTRAVENOUS
  Administered 2020-11-26 – 2020-12-03 (×4): 4 ug/kg/min via INTRAVENOUS
  Filled 2020-11-16 (×10): qty 250

## 2020-11-16 MED ORDER — ENOXAPARIN SODIUM 40 MG/0.4ML ~~LOC~~ SOLN
40.0000 mg | SUBCUTANEOUS | Status: DC
  Administered 2020-11-17 – 2020-11-20 (×4): 40 mg via SUBCUTANEOUS
  Filled 2020-11-16 (×4): qty 0.4

## 2020-11-16 MED ORDER — HEPARIN (PORCINE) IN NACL 1000-0.9 UT/500ML-% IV SOLN
INTRAVENOUS | Status: AC
  Filled 2020-11-16: qty 500

## 2020-11-16 MED ORDER — FUROSEMIDE 10 MG/ML IJ SOLN
80.0000 mg | Freq: Two times a day (BID) | INTRAMUSCULAR | Status: DC
  Administered 2020-11-16 – 2020-11-19 (×7): 80 mg via INTRAVENOUS
  Filled 2020-11-16 (×7): qty 8

## 2020-11-16 MED ORDER — HYDRALAZINE HCL 20 MG/ML IJ SOLN: 10.0000 mg | INTRAMUSCULAR | Status: AC | PRN

## 2020-11-16 MED ORDER — MIDAZOLAM HCL 2 MG/2ML IJ SOLN
INTRAMUSCULAR | Status: DC | PRN
  Administered 2020-11-16: 1 mg via INTRAVENOUS

## 2020-11-16 MED ORDER — LABETALOL HCL 5 MG/ML IV SOLN: 10.0000 mg | INTRAVENOUS | Status: AC | PRN

## 2020-11-16 SURGICAL SUPPLY — 6 items
CATH SWAN GANZ 7F STRAIGHT (CATHETERS) ×1 IMPLANT
GLIDESHEATH SLENDER 7FR .021G (SHEATH) ×1 IMPLANT
GUIDEWIRE .025 260CM (WIRE) ×1 IMPLANT
TRANSDUCER W/MONITORING KIT (MISCELLANEOUS) ×1 IMPLANT
TUBING ART PRESS 72  MALE/FEM (TUBING) ×2
TUBING ART PRESS 72 MALE/FEM (TUBING) IMPLANT

## 2020-11-16 NOTE — Consult Note (Addendum)
Advanced Heart Failure Team Consult Note   Primary Physician: Lawerance Cruel, MD PCP-Cardiologist:  Sinclair Grooms, MD  Reason for Consultation:  Heart Failure   HPI:    Marc [name] is seen today for evaluation of heart failure  at the request of  Dr Tamala Julian.   [name] is a 70 year old with a history of DMII, CAD previous anterior MI, HTN, hyperlipidemia, CKD Stage III, obesity , anemia., and combined systolic/diastolic heart failure.   Developed shortness of breath 3/8 with progressive functional decline. Saw Dr Tamala Julian and had newly noted anemia. Occult stool was negative. Had follow up with PCP and was started on po iron. He returned  Dr Darliss Ridgel office 10/30/20  and had improved a little bit. He has had profound fatigue and extremely limited walking. He is retired.    Presented with increased shortness of breath. Hgb trending down. He has received ferahemie. Had ECHO this admit with EF 20-25% and RV normal. WMA. Diuresing with IV lasix with sluggish response.   Had [place] 3/31.   PA Oxygen saturation: 50% and 46% with avg 48%  Fick CO 4.59 L/min; CI 2.38 L/min/m2  Thermo CO 3.83 L/mi; CI 2.0 L/min/m2  Mean PA 36 mmHg  Mean wedge 25 mmHg   Echo EF 20-25% RV normal. Mod-Severe MR  Review of Systems: [y] = yes, '[ ]'$  = no   . General: Weight gain [ Y]; Weight loss '[ ]'$ ; Anorexia '[ ]'$ ; Fatigue [Y ]; Fever '[ ]'$ ; Chills '[ ]'$ ; Weakness [ Y]  . Cardiac: Chest pain/pressure '[ ]'$ ; Resting SOB [ Y]; Exertional SOB [Y ]; Rolla Plate ]; Pedal Edema Y]; Palpitations '[ ]'$ ; Syncope '[ ]'$ ; Presyncope '[ ]'$ ; Paroxysmal nocturnal dyspnea'[ ]'$   . Pulmonary: Cough '[ ]'$ ; Wheezing'[ ]'$ ; Hemoptysis'[ ]'$ ; Sputum '[ ]'$ ; Snoring '[ ]'$   . GI: Vomiting'[ ]'$ ; Dysphagia'[ ]'$ ; Melena'[ ]'$ ; Hematochezia '[ ]'$ ; Heartburn'[ ]'$ ; Abdominal pain '[ ]'$ ; Constipation '[ ]'$ ; Diarrhea '[ ]'$ ; BRBPR '[ ]'$   . GU: Hematuria'[ ]'$ ; Dysuria '[ ]'$ ; Nocturia'[ ]'$   . Vascular: Pain in legs with walking '[ ]'$ ; Pain in feet with lying flat '[ ]'$ ; Non-healing  sores '[ ]'$ ; Stroke '[ ]'$ ; TIA '[ ]'$ ; Slurred speech '[ ]'$ ;  . Neuro: Headaches'[ ]'$ ; Vertigo'[ ]'$ ; Seizures'[ ]'$ ; Paresthesias'[ ]'$ ;Blurred vision '[ ]'$ ; Diplopia '[ ]'$ ; Vision changes '[ ]'$   . Ortho/Skin: Arthritis '[ ]'$ ; Joint pain '[ ]'$ ; Muscle pain '[ ]'$ ; Joint swelling '[ ]'$ ; Back Pain '[ ]'$ ; Rash '[ ]'$   . Psych: Depression'[ ]'$ ; Anxiety'[ ]'$   . Heme: Bleeding problems '[ ]'$ ; Clotting disorders '[ ]'$ ; Anemia [Y ]  . Endocrine: Diabetes [ Y]; Thyroid dysfunction'[ ]'$   Home Medications Prior to Admission medications   Medication Sig Start Date End Date Taking? Authorizing Provider  acetaminophen (TYLENOL) 500 MG tablet Take 1,000 mg by mouth every 6 (six) hours as needed for mild pain.   Yes [provider]  atorvastatin (LIPITOR) 40 MG tablet TAKE 1 TABLET BY MOUTH EVERY DAY Patient taking differently: Take 40 mg by mouth daily. 08/29/20  Yes Belva Crome, MD  betamethasone dipropionate 0.05 % cream Apply 1 application topically daily as needed (rash). 09/10/19  Yes [provider]  calcium carbonate (TUMS - DOSED IN MG ELEMENTAL CALCIUM) 500 MG chewable tablet Chew 1 tablet by mouth as needed for heartburn.   Yes [provider]  carvedilol (COREG) 25 MG tablet Take 1 tablet (25 mg total) by  mouth 2 (two) times daily with a meal. Patient taking differently: Take 12.5 mg by mouth 2 (two) times daily with a meal. 01/02/16  Yes Belva Crome, MD  cetirizine-pseudoephedrine (ZYRTEC-D) 5-120 MG tablet Take 1 tablet by mouth daily.   Yes [provider]  diclofenac sodium (VOLTAREN) 1 % GEL Apply 2 g topically 4 (four) times daily as needed (knee pain).   Yes [provider]  diphenhydrAMINE HCl (ZZZQUIL) 50 MG/30ML LIQD Take 15-30 mLs by mouth at bedtime as needed (sleep).   Yes [provider]  ENTRESTO 49-51 MG TAKE 1 TABLET BY MOUTH TWICE A DAY 05/19/20  Yes Belva Crome, MD  ezetimibe (ZETIA) 10 MG tablet TAKE 1 TABLET BY MOUTH EVERY DAY Patient taking differently: Take 10 mg by  mouth daily. 08/29/20  Yes Belva Crome, MD  fish oil-omega-3 fatty acids 1000 MG capsule Take 3 g by mouth daily.   Yes [provider]  furosemide (LASIX) 20 MG tablet Take 1 tablet (20 mg total) by mouth every Monday, Wednesday, and Friday. Patient taking differently: Take 40 mg by mouth daily. 10/25/20  Yes Belva Crome, MD  glimepiride (AMARYL) 4 MG tablet Take 4 mg by mouth daily. 04/29/15  Yes [provider]  iron polysaccharides (NIFEREX) 150 MG capsule Take 150 mg by mouth daily.   Yes [provider]  metFORMIN (GLUCOPHAGE) 500 MG tablet Take 1,000 mg by mouth 2 (two) times daily with a meal.    Yes [provider]  Multiple Vitamin (MULTIVITAMIN) capsule Take 1 capsule by mouth daily.   Yes [provider]  nitroGLYCERIN (NITROSTAT) 0.4 MG SL tablet PLACE 1 TABLET UNDER THE TONGUE EVERY 5 MINUTES AS NEEDED FOR CHEST PAIN Patient taking differently: Place 0.4 mg under the tongue every 5 (five) minutes as needed for chest pain. 04/21/20  Yes Belva Crome, MD  pantoprazole (PROTONIX) 40 MG tablet Take 40 mg by mouth 2 (two) times daily before a meal. 09/03/20  Yes [provider]  selenium sulfide (SELSUN) 2.5 % shampoo Apply 1 application topically daily as needed for itching.    Yes [provider]  clopidogrel (PLAVIX) 75 MG tablet TAKE 1 TABLET BY MOUTH EVERY DAY 12/31/19   Belva Crome, MD    Past Medical History: Past Medical History:  Diagnosis Date  . Cardiomyopathy ([place])    Left ventricle dysfunction with LVEF 35-40% by echo March 2009  . Chest pain   . Coronary atherosclerosis of native coronary artery    with large anterior myocardial infarction 1997. PTCA LAD. Distal circumflex and first diagonal DES 2011  . Diabetes ([place])   . Heart failure ([place])   . HTN (hypertension)   . Hypercholesteremia   . Hypercholesteremia   . Hyperlipidemia   . Hyperlipidemia   . Psoriasis   . Sciatica     Past Surgical  History: Past Surgical History:  Procedure Laterality Date  . CORONARY ANGIOPLASTY WITH STENT PLACEMENT     x 2 Dr. Tamala Julian    Family History: Family History  Problem Relation Age of Onset  . Arrhythmia Mother   . Diabetes Mother   . Heart failure Mother   . Hypertension Mother   . Prostate cancer Father   . Stroke Father   . Thyroid disease Brother   . Healthy Sister   . Healthy Brother   . Prostate cancer Brother     Social History: Social History   Socioeconomic History  .  Marital status: Married    Spouse name: Luellen Pucker  . Number of children: 1  . Years of education: Not on file  . Highest education level: Bachelor's degree (e.g., BA, AB, BS)  Occupational History  . Occupation: retired  Tobacco Use  . Smoking status: Never Smoker  . Smokeless tobacco: Never Used  Vaping Use  . Vaping Use: Never used  Substance and Sexual Activity  . Alcohol use: Yes    Alcohol/week: 0.0 standard drinks    Comment: occasional  . Drug use: No  . Sexual activity: Not on file  Other Topics Concern  . Not on file  Social History Narrative  . Not on file   Social Determinants of Health   Financial Resource Strain: Low Risk   . Difficulty of Paying Living Expenses: Not hard at all  Food Insecurity: No Food Insecurity  . Worried About Charity fundraiser in the Last Year: Never true  . Ran Out of Food in the Last Year: Never true  Transportation Needs: No Transportation Needs  . Lack of Transportation (Medical): No  . Lack of Transportation (Non-Medical): No  Physical Activity: Not on file  Stress: Not on file  Social Connections: Not on file    Allergies:  Allergies  Allergen Reactions  . Candesartan Cilexetil Itching    Other reaction(s): itching, tingling in extrmeities  . Spironolactone     Other reaction(s): GI Upset (intolerance), gynecomastia    Objective:    Vital Signs:   Temp:  [98 F (36.7 C)-98.7 F (37.1 C)] 98.3 F (36.8 C) (03/31 1614) Pulse  Rate:  [0-117] 93 (03/31 1614) Resp:  [0-19] 16 (03/31 1614) BP: (85-107)/(60-73) 95/60 (03/31 1614) SpO2:  [93 %-100 %] 95 % (03/31 1614) Weight:  [80.8 kg] 80.8 kg (03/31 0517) Last BM Date: 11/16/20  Weight change: Filed Weights   11/14/20 0427 11/15/20 0431 11/16/20 0517  Weight: 78 kg 79.8 kg 80.8 kg    Intake/Output:   Intake/Output Summary (Last 24 hours) at 11/16/2020 1639 Last data filed at 11/16/2020 1614 Gross per 24 hour  Intake 836.42 ml  Output 1300 ml  Net -463.58 ml      Physical Exam    General:  Sitting in the chair. Breathless when talking. No resp difficulty HEENT: normal Neck: supple. JVP 8-9 . Carotids 2+ bilat; no bruits. No lymphadenopathy or thyromegaly appreciated. Cor: PMI nondisplaced. Regular rate & rhythm. No rubs, gallops. 2/6 MR . Lungs: clear Abdomen: soft, nontender, nondistended. No hepatosplenomegaly. No bruits or masses. Good bowel sounds. Extremities: no cyanosis, clubbing, rash, Cool R and LLE 3+  edema Neuro: alert & orientedx3, cranial nerves grossly intact. moves all 4 extremities w/o difficulty. Affect pleasant   Telemetry    Sinus Rhythm 90s  EKG    Sinus Tach 108 bpm   Labs   Basic Metabolic Panel: Recent Labs  Lab 11/11/20 2151 11/12/20 0253 11/12/20 0253 11/13/20 0333 11/14/20 0410 11/15/20 0535 11/16/20 0356 11/16/20 1545 11/16/20 1546  NA  --  137   < > 133* 131* 128* 127* 131* 130*  K  --  4.0   < > 3.3* 4.1 4.3 4.5 4.2 4.2  CL  --  101  --  100 99 95* 94*  --   --   CO2  --  27  --  '26 24 22 24  '$ --   --   GLUCOSE  --  95  --  113* 82 108* 148*  --   --  BUN  --  28*  --  25* 31* 42* 45*  --   --   CREATININE  --  1.76*  --  1.63* 1.66* 1.90* 1.85*  --   --   CALCIUM  --  9.4   < > 8.9 8.7* 8.8* 8.8*  --   --   MG 2.0  --   --   --  1.8  --   --   --   --    < > = values in this interval not displayed.    Liver Function Tests: Recent Labs  Lab 11/11/20 2151  AST 31  ALT 25  ALKPHOS 55   BILITOT 1.4*  PROT 7.1  ALBUMIN 3.6   No results for input(s): LIPASE, AMYLASE in the last 168 hours. No results for input(s): AMMONIA in the last 168 hours.  CBC: Recent Labs  Lab 11/11/20 2151 11/12/20 0253 11/12/20 0851 11/13/20 0333 11/15/20 0535 11/16/20 1046 11/16/20 1545 11/16/20 1546  WBC 7.5 7.9 6.4  --  5.6 6.4  --   --   NEUTROABS 5.9  --   --   --  4.4  --   --   --   HGB 8.9* 8.6* 8.0* 7.9* 7.7* 7.9* 8.2* 8.5*  HCT 30.0* 26.9* 26.2* 25.5* 24.3* 26.3* 24.0* 25.0*  MCV 81.7 80.3 80.4  --  78.6* 80.7  --   --   PLT 250 284 233  --  197 253  --   --     Cardiac Enzymes: No results for input(s): CKTOTAL, CKMB, CKMBINDEX, TROPONINI in the last 168 hours.  BNP: BNP (last 3 results) Recent Labs    11/11/20 2158  BNP 1,728.5*    ProBNP (last 3 results) Recent Labs    10/24/20 1119  PROBNP 1,685*     CBG: Recent Labs  Lab 11/15/20 1542 11/15/20 2115 11/16/20 0559 11/16/20 1143 11/16/20 1624  GLUCAP 169* 188* 174* 200* 167*    Coagulation Studies: No results for input(s): LABPROT, INR in the last 72 hours.   Imaging   CARDIAC CATHETERIZATION  Result Date: 11/16/2020  Mild pulmonary hypertension with mean PA pressure 36 mmHg  Mean pulmonary capillary wedge pressure 25 mmHg with V wave to 36 mmHg.  Pulmonary artery O2 saturation 46% and 50%.  Thermodilution cardiac output 3.85 L/min; cardiac index 2.0L/min/M2  Fick cardiac output 4.59 L/min and cardiac index 3.85 L/min per meter square  PVR 2.4 Wood units using Fick and 2.86 using thermal dilution. Overall, the patient is in left heart failure with poor cardiac output and will need inotropic support to clear pulmonary congestion and then hopefully reinitiate heart failure therapy.     Medications:     Current Medications: . atorvastatin  40 mg Oral Daily  . calcium carbonate  1 tablet Oral BID WC  . [START ON 11/17/2020] enoxaparin (LOVENOX) injection  40 mg Subcutaneous Q24H  .  ezetimibe  10 mg Oral Daily  . fluticasone  2 spray Each Nare BID  . insulin aspart  0-15 Units Subcutaneous TID AC & HS  . multivitamin with minerals  1 tablet Oral Daily  . omega-3 acid ethyl esters  1 g Oral Daily  . pantoprazole  40 mg Oral Daily  . polyethylene glycol  17 g Oral Daily  . sodium chloride flush  3 mL Intravenous Q12H  . sodium chloride flush  3 mL Intravenous Q12H     Infusions: . sodium chloride    . sodium  chloride        Assessment/Plan   1. A/C Systolic Heart Failure,  ICM  ECHO 2019 35-40%  ECHO EF 20-25% RV normal Mod -Severe MR.  Had [place] today - CO 46^  PCWP 25, RA 8, PA sat low.  - Place PICC line to guide diuresis and CVP.  - BP has been low. Add dobutamine 2.5 mcg.  - Need to continue IV diuresis.  -3/30 -  BB and ARNI held due to hypotension.  -Intolerant SGT2i with yeast infections.   2. Anemia  EGD 2022 - normal. No recent colonoscopy - Fecal was negative prior to admit.  - Received feraheme this admit.  - On protonix.   3. CAD Anterior MI 1997 PTCA LAD distal circ and 1st diagonal DES 2011  4. CKD Stage IIIb Creatinine baseline 1.6-1.8  Follow renal function daily.     5. MR  Note on ECHO.      Length of Stay: Arnold, NP  11/16/2020, 4:39 PM  Advanced Heart Failure Team Pager [phone] (M-F; 7a - 5p)  Please contact [facility] for night-coverage after hours (4p -7a ) and weekends on [email or web address]  Patient seen and examined with the above-signed Advanced Practice Provider and/or Housestaff. I personally reviewed laboratory data, imaging studies and relevant notes. I independently examined the patient and formulated the important aspects of the plan. I have edited the note to reflect any of my changes or salient points. I have personally discussed the plan with the patient and/or family.  70 y/o male with h/o CAD and ischemic CM baseline EF 35-40% admitted with worsening HF and persistent iron-deficient anemia.   Echo  EF 20-25% with severe central MR. RV normal.  Has received Feraheme and also hag EGD (unrevealing) but remains anemic  Developed AKI with attempts at diuresis.  RHC today showed volume overload with low output.   Has had NYHA IV symptoms.   General:  Sitting in chair  SOBa t rest  HEENT: normal Neck: supple. JVP to ear Carotids 2+ bilat; no bruits. No lymphadenopathy or thryomegaly appreciated. Cor: PMI nondisplaced. Regular rate & rhythm. 2/6 MR Lungs: clear Abdomen: soft, nontender, nondistended. No hepatosplenomegaly. No bruits or masses. Good bowel sounds. Extremities: no cyanosis, clubbing, rash, 3+ edema cool  Neuro: alert & orientedx3, cranial nerves grossly intact. moves all 4 extremities w/o difficulty. Affect pleasant  He has low output HF with volume overload. Will start dobutamine and diuresis. Place PICC to follow co-ox and CVP. When volume status improved will need cMRI to get a better idea of viability but I am concerned that he may need advanced therapies (I.e. VAD).   Will need to further w/u anemia. Check myeloma panel, LDH, EPO level. Will eventually also need colonoscopy or capsule.   Glori Bickers, MD  6:15 PM

## 2020-11-16 NOTE — CV Procedure (Signed)
   PA Oxygen saturation: 50% and 46% with avg 48%  Fick CO 4.59 L/min; CI 2.38 L/min/m2  Thermo CO 3.83 L/mi; CI 2.0 L/min/m2  Mean PA 36 mmHg  Mean wedge 25 mmHg  Needs inotropic therapy. Spoke to Dr. Haroldine Laws who will see.

## 2020-11-16 NOTE — H&P (View-Only) (Signed)
Progress Note  Patient Name: Marc [name] Date of Encounter: 11/16/2020  Primary Cardiologist: Sinclair Grooms, MD   Subjective   No events overnight.  Still has breathing issues that prevent him for sleeping while laying flat.  Notes that his 1 L of O2 is more for piece of mind and comfort.  Entresto and Coreg have been held multiple times through this admission for asymptomatic hypotension.  Inpatient Medications    Scheduled Meds: . atorvastatin  40 mg Oral Daily  . calcium carbonate  1 tablet Oral BID WC  . enoxaparin (LOVENOX) injection  40 mg Subcutaneous Q24H  . ezetimibe  10 mg Oral Daily  . fluticasone  2 spray Each Nare BID  . insulin aspart  0-15 Units Subcutaneous TID AC & HS  . [START ON 11/17/2020] metoprolol succinate  50 mg Oral Daily  . multivitamin with minerals  1 tablet Oral Daily  . omega-3 acid ethyl esters  1 g Oral Daily  . pantoprazole  40 mg Oral Daily  . polyethylene glycol  17 g Oral Daily  . sodium chloride flush  3 mL Intravenous Q12H   Continuous Infusions: . sodium chloride     PRN Meds: sodium chloride, acetaminophen, guaiFENesin-dextromethorphan, ondansetron (ZOFRAN) IV, sodium chloride flush   Vital Signs    Vitals:   11/15/20 1643 11/15/20 1957 11/16/20 0517 11/16/20 0912  BP: (!) 88/60 (!) 85/64 106/68 101/68  Pulse:  96 99 100  Resp:  '18 18 18  '$ Temp:  98 F (36.7 C) 98.7 F (37.1 C) 98.3 F (36.8 C)  TempSrc:  Oral Oral Oral  SpO2:  100% 100% 98%  Weight:   80.8 kg   Height:        Intake/Output Summary (Last 24 hours) at 11/16/2020 0951 Last data filed at 11/16/2020 0935 Gross per 24 hour  Intake 1040 ml  Output 1350 ml  Net -310 ml   Filed Weights   11/14/20 0427 11/15/20 0431 11/16/20 0517  Weight: 78 kg 79.8 kg 80.8 kg    Telemetry    Sinus rhythm to sinus tachycardia - Personally Reviewed  ECG    No new last 24 - Personally Reviewed  Physical Exam   GEN: No acute distress.   Neck: No JVD   Cardiac: RRR, II/VI Holosystolic murmur, rubs, or gallops.  Respiratory: Clear to auscultation bilaterally. GI: Soft, nontender, non-distended but dull to percussion MS: +1 edema; legs are tight; No deformity. Neuro:  Nonfocal  Psych: Normal affect   Labs    Chemistry Recent Labs  Lab 11/11/20 2151 11/12/20 0253 11/14/20 0410 11/15/20 0535 11/16/20 0356  NA  --    < > 131* 128* 127*  K  --    < > 4.1 4.3 4.5  CL  --    < > 99 95* 94*  CO2  --    < > '24 22 24  '$ GLUCOSE  --    < > 82 108* 148*  BUN  --    < > 31* 42* 45*  CREATININE  --    < > 1.66* 1.90* 1.85*  CALCIUM  --    < > 8.7* 8.8* 8.8*  PROT 7.1  --   --   --   --   ALBUMIN 3.6  --   --   --   --   AST 31  --   --   --   --   ALT 25  --   --   --   --  ALKPHOS 55  --   --   --   --   BILITOT 1.4*  --   --   --   --   GFRNONAA  --    < > 44* 38* 39*  ANIONGAP  --    < > '8 11 9   '$ < > = values in this interval not displayed.     Hematology Recent Labs  Lab 11/12/20 0253 11/12/20 0851 11/13/20 0333 11/15/20 0535  WBC 7.9 6.4  --  5.6  RBC 3.35* 3.26*  --  3.09*  HGB 8.6* 8.0* 7.9* 7.7*  HCT 26.9* 26.2* 25.5* 24.3*  MCV 80.3 80.4  --  78.6*  MCH 25.7* 24.5*  --  24.9*  MCHC 32.0 30.5  --  31.7  RDW 19.4* 19.3*  --  19.2*  PLT 284 233  --  197    Cardiac EnzymesNo results for input(s): TROPONINI in the last 168 hours. No results for input(s): TROPIPOC in the last 168 hours.   BNP Recent Labs  Lab 11/11/20 2158  BNP 1,728.5*     DDimer No results for input(s): DDIMER in the last 168 hours.   Radiology    No results found.  Cardiac Studies   1. Global hypokinesis with akinesis of the apex; overall severe LV  dysfunction.  2. Left ventricular ejection fraction, by estimation, is 20 to 25%. The  left ventricle has severely decreased function. The left ventricle  demonstrates regional wall motion abnormalities (see scoring  diagram/findings for description). The left  ventricular internal  cavity size was mildly dilated. Left ventricular  diastolic parameters are indeterminate.  3. Right ventricular systolic function is normal. The right ventricular  size is normal. There is moderately elevated pulmonary artery systolic  pressure.  4. Left atrial size was moderately dilated.  5. Moderate pleural effusion in the left lateral region.  6. The mitral valve is normal in structure. Moderate to severe mitral  valve regurgitation. No evidence of mitral stenosis.  7. The aortic valve is tricuspid. Aortic valve regurgitation is not  visualized. No aortic stenosis is present.  8. The inferior vena cava is dilated in size with >50% respiratory  variability, suggesting right atrial pressure of 8 mmHg.   Patient Profile     70 y.o. male with longstanding cardiomyopathy with evidence of an extensive anterior wall scar and previously with mild-moderate reduction in left ventricular systolic function is admitted with acute exacerbation of chronic combined systolic and diastolic heart failure in the setting of worsening iron deficiency anemia.  He is found to have further reduction in left ventricular systolic function, new regional wall motion abnormality and moderate to severe mitral insufficiency with probably ischemic mechanism.  Assessment & Plan    Heart Failure Reduced Ejection Fraction  Moderate to Severe MR IDA - NYHA class III, Stage C, unclear volume status, etiology from ischemia - Diuretic regimen: holding lasix today - Discussed the importance of fluid restriction of < 2 L, salt restriction, and checking daily weights  - Aldactone not tolerated with gynecomastia (could be future eplerenone or fineranone candidate- will re-address post AKI) - SGLT2i not tolerate with yeast infections; outpatient restart has been suggested recommended - Device Indications: No need for device at this time; will need outpatient consideration - complete iron threapy - Stopping his lasix  today; received AM coreg but will transition future doses to metoprolol succinate for BP; holding patients Entresto with plans of transitioning back to losartan low dose:  Concerned  that if actually taking, patient's BP will be significantly decreased and may be symptomatic - unclear if he is truly euvolemic or if this represents worsening HF.  His inability to lay flat is concerning.   - consented for RHC today, Risks and benefits of cardiac catheterization have been discussed with the patient.  These include bleeding, infection, kidney damage, stroke, heart attack, death.  The patient understands these risks and is willing to proceed. - low threshold for AHF consult - Potential Verociguat candidate  Coronary Artery Disease; Obstructive HLD - likely asymptomatic - anatomy: dLCx PCI, D1 PCI in 2011  - ASA held in the setting of new anemia; likely will restart for 11/17/20 - continue statin, zetia goal LDL < 70 - continue BB as above - planned for outpatient evaluation  DM- reasonable glycemic control; no change presently  Diet- NPO for RHC; Cardiac carb consistent DVT PPx- SCDs on Full Code  Discussed with patient and his primary cardiologist  For questions or updates, please contact [facility] Please consult [email or web address] for contact info under Cardiology/STEMI.      Signed, Werner Lean, MD  11/16/2020, 9:51 AM

## 2020-11-16 NOTE — Interval H&P Note (Signed)
Cath Lab Visit (complete for each Cath Lab visit)  Clinical Evaluation Leading to the Procedure:   ACS: No.  Non-ACS:    Anginal Classification: CCS [name]  Anti-ischemic medical therapy: Maximal Therapy (2 or more classes of medications)  Non-Invasive Test Results: No non-invasive testing performed  Prior CABG: No previous CABG      History and Physical Interval Note:  11/16/2020 3:24 PM  Marc [name]  has presented today for surgery, with the diagnosis of heart failure.  The various methods of treatment have been discussed with the patient and family. After consideration of risks, benefits and other options for treatment, the patient has consented to  Procedure(s): RIGHT HEART CATH (N/A) as a surgical intervention.  The patient's history has been reviewed, patient examined, no change in status, stable for surgery.  I have reviewed the patient's chart and labs.  Questions were answered to the patient's satisfaction.     Marc [name]

## 2020-11-16 NOTE — Care Management Important Message (Signed)
Important Message  Patient Details  Name: OLLIS WEMHOFF MRN: [id number] Date of Birth: 02/09/1951   Medicare Important Message Given:  Yes     [name] 11/16/2020, 8:53 AM

## 2020-11-16 NOTE — Progress Notes (Signed)
  Mobility Specialist Criteria Algorithm Info.  Mobility Team: [facility] elevated:Self regulated Activity: Ambulated in hall Range of motion: Active; All extremities Level of assistance: Independent Assistive device: None Minutes sitting in chair:  Minutes stood: 5 minutes Minutes ambulated: 5 minutes Distance ambulated (ft): 360 ft Mobility response: Tolerated well Bed Position: Chair  Patient ambulated in hallway 360 feet independently, tolerated ambulation well without complaint or incident.   11/16/2020 12:30 PM

## 2020-11-16 NOTE — Progress Notes (Signed)
Heart Failure Stewardship Pharmacist Progress Note   PCP: Lawerance Cruel, MD PCP-Cardiologist: Sinclair Grooms, MD    HPI:  70 yo M with PMH of T2DM, CAD, ICM, CHF, CKD III, HTN, HLD, and GERD. He presented to [facility] on 11/11/20 as a direct admit for shortness of breath and anemia. Also notably with orthopnea and fatigue. Hgb had decreased from 8.9 to 7.8 prior to admission and iron studies revealed TSAT 12, ferritin 126, and iron 33. An ECHO was done on 11/12/20 and LVEF was 20-25% (down from 35-40% in August 2019).  Current HF Medications: Metoprolol XL 50 mg daily  Prior to admission HF Medications: Furosemide 40 mg daily Carvedilol 12.5 mg BID Entresto 49/51 mg BID  Pertinent Lab Values: . Serum creatinine 1.85, BUN 45, Potassium 4.5, Sodium 127, BNP 1728.5  Vital Signs: . Weight: 178 lbs (admission weight: 169 lbs) . Blood pressure: 100/60s . Heart rate: 90s   Medication Assistance / Insurance Benefits Check: Does the patient have prescription insurance?  Yes Type of insurance plan: [employer] Medicare  Does the patient qualify for medication assistance through manufacturers or grants?   Pending . Eligible grants and/or patient assistance programs: pending . Medication assistance applications in progress: none  . Medication assistance applications approved: none Approved medication assistance renewals will be completed by: pending  Outpatient Pharmacy:  Prior [facility] Is the patient willing to use [place] at discharge? Yes Is the patient willing to transition their outpatient pharmacy to utilize a [facility] outpatient pharmacy?   No     Assessment: 1. Acute on chronic systolic CHF (EF 0000000), due to ICM. NYHA class II symptoms. - IV lasix off today per cardiology. Planning for RHC today to assess volume status with ongoing orthopnea. - Carvedilol switched to metoprolol XL 50 mg daily today. Has not received several  doses of carvedilol due to asymptomatic hypotension - Off Entresto for asymptomatic hypotension as well. Could restart at lower 24/26 mg BID dose when able. - Did not tolerate spironolactone in the past due to gynecomastia. Consider starting eplerenone (once BP allows) to rechallenge MRA.   - Did not tolerate Jardiance in the past due to fungal infection about 3 years ago. Discussed with patient about rechallenging with alternative SGLT2i Wilder Glade) and pt is willing to try. Discussed with Dr. Tamala Julian who also agrees. Will initiate once AKI resolves. - Consider adding digoxin once AKI resolves - Nulecit 125 mg IV daily x 3 doses (completed)  Plan: 1) Medication changes recommended at this time: - None pending RHC today  2) Patient assistance: - Entresto copay $0 - Farxiga requires prior authorization  3)  Education  - Patient has been educated on current HF medications and potential additions to HF medication regimen - Patient verbalizes understanding that over the next few months, these medication doses may change and more medications may be added to optimize HF regimen - Patient has been educated on basic disease state pathophysiology and goals of therapy - Time spent (30 mins)  Kerby Nora, PharmD, BCPS Heart Failure Stewardship Pharmacist Phone [phone]

## 2020-11-16 NOTE — Progress Notes (Signed)
Progress Note  Patient Name: Marc [name] Date of Encounter: 11/16/2020  Primary Cardiologist: Sinclair Grooms, MD   Subjective   No events overnight.  Still has breathing issues that prevent him for sleeping while laying flat.  Notes that his 1 L of O2 is more for piece of mind and comfort.  Entresto and Coreg have been held multiple times through this admission for asymptomatic hypotension.  Inpatient Medications    Scheduled Meds: . atorvastatin  40 mg Oral Daily  . calcium carbonate  1 tablet Oral BID WC  . enoxaparin (LOVENOX) injection  40 mg Subcutaneous Q24H  . ezetimibe  10 mg Oral Daily  . fluticasone  2 spray Each Nare BID  . insulin aspart  0-15 Units Subcutaneous TID AC & HS  . [START ON 11/17/2020] metoprolol succinate  50 mg Oral Daily  . multivitamin with minerals  1 tablet Oral Daily  . omega-3 acid ethyl esters  1 g Oral Daily  . pantoprazole  40 mg Oral Daily  . polyethylene glycol  17 g Oral Daily  . sodium chloride flush  3 mL Intravenous Q12H   Continuous Infusions: . sodium chloride     PRN Meds: sodium chloride, acetaminophen, guaiFENesin-dextromethorphan, ondansetron (ZOFRAN) IV, sodium chloride flush   Vital Signs    Vitals:   11/15/20 1643 11/15/20 1957 11/16/20 0517 11/16/20 0912  BP: (!) 88/60 (!) 85/64 106/68 101/68  Pulse:  96 99 100  Resp:  '18 18 18  '$ Temp:  98 F (36.7 C) 98.7 F (37.1 C) 98.3 F (36.8 C)  TempSrc:  Oral Oral Oral  SpO2:  100% 100% 98%  Weight:   80.8 kg   Height:        Intake/Output Summary (Last 24 hours) at 11/16/2020 0951 Last data filed at 11/16/2020 0935 Gross per 24 hour  Intake 1040 ml  Output 1350 ml  Net -310 ml   Filed Weights   11/14/20 0427 11/15/20 0431 11/16/20 0517  Weight: 78 kg 79.8 kg 80.8 kg    Telemetry    Sinus rhythm to sinus tachycardia - Personally Reviewed  ECG    No new last 24 - Personally Reviewed  Physical Exam   GEN: No acute distress.   Neck: No JVD   Cardiac: RRR, II/VI Holosystolic murmur, rubs, or gallops.  Respiratory: Clear to auscultation bilaterally. GI: Soft, nontender, non-distended but dull to percussion MS: +1 edema; legs are tight; No deformity. Neuro:  Nonfocal  Psych: Normal affect   Labs    Chemistry Recent Labs  Lab 11/11/20 2151 11/12/20 0253 11/14/20 0410 11/15/20 0535 11/16/20 0356  NA  --    < > 131* 128* 127*  K  --    < > 4.1 4.3 4.5  CL  --    < > 99 95* 94*  CO2  --    < > '24 22 24  '$ GLUCOSE  --    < > 82 108* 148*  BUN  --    < > 31* 42* 45*  CREATININE  --    < > 1.66* 1.90* 1.85*  CALCIUM  --    < > 8.7* 8.8* 8.8*  PROT 7.1  --   --   --   --   ALBUMIN 3.6  --   --   --   --   AST 31  --   --   --   --   ALT 25  --   --   --   --  ALKPHOS 55  --   --   --   --   BILITOT 1.4*  --   --   --   --   GFRNONAA  --    < > 44* 38* 39*  ANIONGAP  --    < > '8 11 9   '$ < > = values in this interval not displayed.     Hematology Recent Labs  Lab 11/12/20 0253 11/12/20 0851 11/13/20 0333 11/15/20 0535  WBC 7.9 6.4  --  5.6  RBC 3.35* 3.26*  --  3.09*  HGB 8.6* 8.0* 7.9* 7.7*  HCT 26.9* 26.2* 25.5* 24.3*  MCV 80.3 80.4  --  78.6*  MCH 25.7* 24.5*  --  24.9*  MCHC 32.0 30.5  --  31.7  RDW 19.4* 19.3*  --  19.2*  PLT 284 233  --  197    Cardiac EnzymesNo results for input(s): TROPONINI in the last 168 hours. No results for input(s): TROPIPOC in the last 168 hours.   BNP Recent Labs  Lab 11/11/20 2158  BNP 1,728.5*     DDimer No results for input(s): DDIMER in the last 168 hours.   Radiology    No results found.  Cardiac Studies   1. Global hypokinesis with akinesis of the apex; overall severe LV  dysfunction.  2. Left ventricular ejection fraction, by estimation, is 20 to 25%. The  left ventricle has severely decreased function. The left ventricle  demonstrates regional wall motion abnormalities (see scoring  diagram/findings for description). The left  ventricular internal  cavity size was mildly dilated. Left ventricular  diastolic parameters are indeterminate.  3. Right ventricular systolic function is normal. The right ventricular  size is normal. There is moderately elevated pulmonary artery systolic  pressure.  4. Left atrial size was moderately dilated.  5. Moderate pleural effusion in the left lateral region.  6. The mitral valve is normal in structure. Moderate to severe mitral  valve regurgitation. No evidence of mitral stenosis.  7. The aortic valve is tricuspid. Aortic valve regurgitation is not  visualized. No aortic stenosis is present.  8. The inferior vena cava is dilated in size with >50% respiratory  variability, suggesting right atrial pressure of 8 mmHg.   Patient Profile     70 y.o. male with longstanding cardiomyopathy with evidence of an extensive anterior wall scar and previously with mild-moderate reduction in left ventricular systolic function is admitted with acute exacerbation of chronic combined systolic and diastolic heart failure in the setting of worsening iron deficiency anemia.  He is found to have further reduction in left ventricular systolic function, new regional wall motion abnormality and moderate to severe mitral insufficiency with probably ischemic mechanism.  Assessment & Plan    Heart Failure Reduced Ejection Fraction  Moderate to Severe MR IDA - NYHA class III, Stage C, unclear volume status, etiology from ischemia - Diuretic regimen: holding lasix today - Discussed the importance of fluid restriction of < 2 L, salt restriction, and checking daily weights  - Aldactone not tolerated with gynecomastia (could be future eplerenone or fineranone candidate- will re-address post AKI) - SGLT2i not tolerate with yeast infections; outpatient restart has been suggested recommended - Device Indications: No need for device at this time; will need outpatient consideration - complete iron threapy - Stopping his lasix  today; received AM coreg but will transition future doses to metoprolol succinate for BP; holding patients Entresto with plans of transitioning back to losartan low dose:  Concerned  that if actually taking, patient's BP will be significantly decreased and may be symptomatic - unclear if he is truly euvolemic or if this represents worsening HF.  His inability to lay flat is concerning.   - consented for RHC today, Risks and benefits of cardiac catheterization have been discussed with the patient.  These include bleeding, infection, kidney damage, stroke, heart attack, death.  The patient understands these risks and is willing to proceed. - low threshold for AHF consult - Potential Verociguat candidate  Coronary Artery Disease; Obstructive HLD - likely asymptomatic - anatomy: dLCx PCI, D1 PCI in 2011  - ASA held in the setting of new anemia; likely will restart for 11/17/20 - continue statin, zetia goal LDL < 70 - continue BB as above - planned for outpatient evaluation  DM- reasonable glycemic control; no change presently  Diet- NPO for RHC; Cardiac carb consistent DVT PPx- SCDs on Full Code  Discussed with patient and his primary cardiologist  For questions or updates, please contact [place] Please consult [email or web address] for contact info under Cardiology/STEMI.      Signed, Werner Lean, MD  11/16/2020, 9:51 AM

## 2020-11-16 NOTE — Progress Notes (Signed)
Patient arrived back onto the unit from cath procedure, alert and oriented x4 with no pain. Patient R brachial site is at a level 0 with no hematoma present wrapped with coban. Patient site area is clean dry and intact. Patient VS are stable and frequent vitals have been set. Telemonitor has been re-applied and dinner tray has been order. Patient's bed is in the lowest setting and call bell is within reach.

## 2020-11-17 ENCOUNTER — Ambulatory Visit: Payer: HMO | Admitting: Interventional Cardiology

## 2020-11-17 ENCOUNTER — Encounter (HOSPITAL_COMMUNITY): Payer: Self-pay | Admitting: Interventional Cardiology

## 2020-11-17 DIAGNOSIS — I255 Ischemic cardiomyopathy: Secondary | ICD-10-CM | POA: Diagnosis not present

## 2020-11-17 DIAGNOSIS — I5023 Acute on chronic systolic (congestive) heart failure: Secondary | ICD-10-CM | POA: Diagnosis not present

## 2020-11-17 LAB — CBC
HCT: 22.6 % — ABNORMAL LOW (ref 39.0–52.0)
Hemoglobin: 7 g/dL — ABNORMAL LOW (ref 13.0–17.0)
MCH: 24.1 pg — ABNORMAL LOW (ref 26.0–34.0)
MCHC: 31 g/dL (ref 30.0–36.0)
MCV: 77.9 fL — ABNORMAL LOW (ref 80.0–100.0)
Platelets: 213 10*3/uL (ref 150–400)
RBC: 2.9 MIL/uL — ABNORMAL LOW (ref 4.22–5.81)
RDW: 19.1 % — ABNORMAL HIGH (ref 11.5–15.5)
WBC: 5.3 10*3/uL (ref 4.0–10.5)
nRBC: 0 % (ref 0.0–0.2)

## 2020-11-17 LAB — BASIC METABOLIC PANEL
Anion gap: 9 (ref 5–15)
BUN: 39 mg/dL — ABNORMAL HIGH (ref 8–23)
CO2: 25 mmol/L (ref 22–32)
Calcium: 8.6 mg/dL — ABNORMAL LOW (ref 8.9–10.3)
Chloride: 99 mmol/L (ref 98–111)
Creatinine, Ser: 1.66 mg/dL — ABNORMAL HIGH (ref 0.61–1.24)
GFR, Estimated: 44 mL/min — ABNORMAL LOW (ref >60)
Glucose, Bld: 119 mg/dL — ABNORMAL HIGH (ref 70–99)
Potassium: 3.8 mmol/L (ref 3.5–5.1)
Sodium: 133 mmol/L — ABNORMAL LOW (ref 135–145)

## 2020-11-17 LAB — GLUCOSE, CAPILLARY
Glucose-Capillary: 115 mg/dL — ABNORMAL HIGH (ref 70–99)
Glucose-Capillary: 237 mg/dL — ABNORMAL HIGH (ref 70–99)
Glucose-Capillary: 186 mg/dL — ABNORMAL HIGH (ref 70–99)
Glucose-Capillary: 209 mg/dL — ABNORMAL HIGH (ref 70–99)

## 2020-11-17 LAB — COOXEMETRY PANEL
Carboxyhemoglobin: 1.5 % (ref 0.5–1.5)
Methemoglobin: 1 % (ref 0.0–1.5)
O2 Saturation: 48.3 %
Total hemoglobin: 7.8 g/dL — ABNORMAL LOW (ref 12.0–16.0)

## 2020-11-17 LAB — LACTATE DEHYDROGENASE: LDH: 165 U/L (ref 98–192)

## 2020-11-17 LAB — MAGNESIUM: Magnesium: 2.2 mg/dL (ref 1.7–2.4)

## 2020-11-17 MED ORDER — SODIUM CHLORIDE 0.9% FLUSH
10.0000 mL | Freq: Two times a day (BID) | INTRAVENOUS | Status: DC
  Administered 2020-11-17 – 2020-11-19 (×4): 10 mL
  Administered 2020-11-19: 40 mL
  Administered 2020-11-20 (×2): 10 mL
  Administered 2020-11-20: 20 mL
  Administered 2020-11-21 – 2020-11-26 (×7): 10 mL
  Administered 2020-11-26: 20 mL
  Administered 2020-11-27: 10 mL
  Administered 2020-11-27: 20 mL
  Administered 2020-11-28 – 2020-12-01 (×3): 10 mL
  Administered 2020-12-01: 20 mL
  Administered 2020-12-02 – 2020-12-03 (×3): 10 mL

## 2020-11-17 MED ORDER — POTASSIUM CHLORIDE CRYS ER 20 MEQ PO TBCR
40.0000 meq | EXTENDED_RELEASE_TABLET | Freq: Once | ORAL | Status: AC
  Administered 2020-11-17: 40 meq via ORAL
  Filled 2020-11-17: qty 2

## 2020-11-17 MED ORDER — CHLORHEXIDINE GLUCONATE CLOTH 2 % EX PADS
6.0000 | MEDICATED_PAD | Freq: Every day | CUTANEOUS | Status: DC
  Administered 2020-11-18 – 2020-12-05 (×18): 6 via TOPICAL

## 2020-11-17 MED ORDER — SODIUM CHLORIDE 0.9% FLUSH: 10.0000 mL | INTRAVENOUS | Status: DC | PRN

## 2020-11-17 NOTE — Progress Notes (Signed)
Pt had a 5 bt run of Vtach, no s/s, pt had an 6 bt run the other day, will continue to monitor, Thanks Buckner Malta.

## 2020-11-17 NOTE — Progress Notes (Signed)
Peripherally Inserted Central Catheter Placement  The IV Nurse has discussed with the patient and/or persons authorized to consent for the patient, the purpose of this procedure and the potential benefits and risks involved with this procedure.  The benefits include less needle sticks, lab draws from the catheter, and the patient may be discharged home with the catheter. Risks include, but not limited to, infection, bleeding, blood clot (thrombus formation), and puncture of an artery; nerve damage and irregular heartbeat and possibility to perform a PICC exchange if needed/ordered by physician.  Alternatives to this procedure were also discussed.  Bard Power PICC patient education guide, fact sheet on infection prevention and patient information card has been provided to patient /or left at bedside.    PICC Placement Documentation  PICC Double Lumen [id number] PICC Right Basilic 41 cm 0 cm (Active)  Indication for Insertion or Continuance of Line Vasoactive infusions 11/17/20 1042  Exposed Catheter (cm) 0 cm 11/17/20 1042  Site Assessment Clean;Dry;Intact 11/17/20 1042  Lumen #1 Status Flushed;Saline locked;Blood return noted 11/17/20 1042  Lumen #2 Status Flushed;Saline locked;Blood return noted 11/17/20 1042  Dressing Type Transparent;Securing device 11/17/20 1042  Dressing Status Clean;Dry;Intact 11/17/20 1042  Antimicrobial disc in place? Yes 11/17/20 [place] Not Applicable [id number]  Line Care Connections checked and tightened 11/17/20 1042  Dressing Intervention New dressing 11/17/20 1042  Dressing Change Due 11/24/20 11/17/20 [place] 11/17/2020, 10:43 AM

## 2020-11-17 NOTE — Progress Notes (Signed)
Pt had a run of SVT, nonsustained, no s/s, pt has Mg and Bmet ordered this morning, will continue to monitor, Thanks Buckner Malta.

## 2020-11-17 NOTE — Progress Notes (Signed)
[facility] EDUCATION NOTE:    VAD coordinator met with patient briefly at bedside.              VAD educational packet including "Understanding Your Options with Advanced Heart Failure", "[place] Patient Agreement for VAD Evaluation and Potential Implantation" consent, and [employer] "Living a More Active Life" HM III booklet", "[place] HM III Patient Education", "[place] Mechanical Circulatory Support Program", and "Decision Aids for Left Ventricular Assist Device" reviewed in detail and left at bedside for continued reference.  All questions answered regarding VAD implant, hospital stay, and what to expect when discharged home living with a heart pump.   Provided brief equipment overview and demonstration with HeartMate III  training loop including discussion on the following:   a) power module   b) system controller   c) universal Charity fundraiser   d) battery clips   e) Batteries   f)  Perc lock   g) Percutaneous lead    Identified the following lifestyle modifications while living on MCS:   1. No driving for at least three months and then only if doctor gives permission to do so.   2. No tub baths while pump implanted, and shower only when doctor gives permission.   3. No swimming or submersion in water while implanted with pump.   4. No contact sports or engaging in jumping activities.   5. Exit site care including dressing changes, monitoring for infection, and importance of keeping percutaneous lead stabilized at all times.     The patient understands that from this discussion it does not mean that they will receive the device, but that depends on an extensive evaluation process. The patient is aware of the fact that if at anytime they want to stop the evaluation process they can.  All questions have been answered at this time and contact information was provided should they encounter any further questions. At this time Marc [name] would like to take the weekend to think about  whether or not he would like to proceed with VAD evaluation at this time. He states he would like [place] to sit and talk with him and his wife and daughter next week so they can make a decision together.    Emerson Monte RN [place] Coordinator  Office: [phone]  24/7 Pager: [phone]

## 2020-11-17 NOTE — Progress Notes (Signed)
Advanced Heart Failure Rounding Note   Subjective:    Dobutamine 2.5 started 3/31 for low output.   Says he feels much better. Less SOB. Orthopnea resolved. However co-ox remains 48% today. CVP 8-9.  Creatinine 1.8 -> 1.6   Objective:   Weight Range:  Vital Signs:   Temp:  [98.3 F (36.8 C)-98.5 F (36.9 C)] 98.4 F (36.9 C) (04/01 1300) Pulse Rate:  [0-117] 101 (04/01 1300) Resp:  [0-22] 20 (04/01 1300) BP: (89-112)/(60-87) 104/61 (04/01 1300) SpO2:  [92 %-100 %] 100 % (04/01 1300) Weight:  [78.6 kg] 78.6 kg (04/01 0548) Last BM Date: 11/16/20  Weight change: Filed Weights   11/15/20 0431 11/16/20 0517 11/17/20 0548  Weight: 79.8 kg 80.8 kg 78.6 kg    Intake/Output:   Intake/Output Summary (Last 24 hours) at 11/17/2020 1321 Last data filed at 11/17/2020 1147 Gross per 24 hour  Intake 479.24 ml  Output 3500 ml  Net -3020.76 ml     Physical Exam: General:  Sitting up in bed. No resp difficulty HEENT: normal Neck: supple. JVP 8-9. Carotids 2+ bilat; no bruits. No lymphadenopathy or thryomegaly appreciated. Cor: PMI nondisplaced. Regular rate & rhythm. 2/6 MR Lungs: clear Abdomen: soft, nontender, nondistended. No hepatosplenomegaly. No bruits or masses. Good bowel sounds. Extremities: no cyanosis, clubbing, rash, edema Neuro: alert & orientedx3, cranial nerves grossly intact. moves all 4 extremities w/o difficulty. Affect pleasant  Telemetry: Sinus 90-110 Personally reviewed  Labs: Basic Metabolic Panel: Recent Labs  Lab 11/11/20 2151 11/12/20 0253 11/13/20 0333 11/14/20 0410 11/15/20 0535 11/16/20 0356 11/16/20 1545 11/16/20 1546 11/17/20 0648  NA  --    < > 133* 131* 128* 127* 131* 130* 133*  K  --    < > 3.3* 4.1 4.3 4.5 4.2 4.2 3.8  CL  --    < > 100 99 95* 94*  --   --  99  CO2  --    < > '26 24 22 24  '$ --   --  25  GLUCOSE  --    < > 113* 82 108* 148*  --   --  119*  BUN  --    < > 25* 31* 42* 45*  --   --  39*  CREATININE  --    < > 1.63*  1.66* 1.90* 1.85*  --   --  1.66*  CALCIUM  --    < > 8.9 8.7* 8.8* 8.8*  --   --  8.6*  MG 2.0  --   --  1.8  --   --   --   --  2.2   < > = values in this interval not displayed.    Liver Function Tests: Recent Labs  Lab 11/11/20 2151  AST 31  ALT 25  ALKPHOS 55  BILITOT 1.4*  PROT 7.1  ALBUMIN 3.6   No results for input(s): LIPASE, AMYLASE in the last 168 hours. No results for input(s): AMMONIA in the last 168 hours.  CBC: Recent Labs  Lab 11/11/20 2151 11/12/20 0253 11/12/20 0851 11/13/20 0333 11/15/20 0535 11/16/20 1046 11/16/20 1545 11/16/20 1546 11/17/20 0648  WBC 7.5 7.9 6.4  --  5.6 6.4  --   --  5.3  NEUTROABS 5.9  --   --   --  4.4  --   --   --   --   HGB 8.9* 8.6* 8.0*   < > 7.7* 7.9* 8.2* 8.5* 7.0*  HCT 30.0* 26.9*  26.2*   < > 24.3* 26.3* 24.0* 25.0* 22.6*  MCV 81.7 80.3 80.4  --  78.6* 80.7  --   --  77.9*  PLT 250 284 233  --  197 253  --   --  213   < > = values in this interval not displayed.    Cardiac Enzymes: No results for input(s): CKTOTAL, CKMB, CKMBINDEX, TROPONINI in the last 168 hours.  BNP: BNP (last 3 results) Recent Labs    11/11/20 2158  BNP 1,728.5*    ProBNP (last 3 results) Recent Labs    10/24/20 1119  PROBNP 1,685*      Other results:  Imaging: CARDIAC CATHETERIZATION  Result Date: 11/16/2020  Mild pulmonary hypertension with mean PA pressure 36 mmHg  Mean pulmonary capillary wedge pressure 25 mmHg with V wave to 36 mmHg.  Pulmonary artery O2 saturation 46% and 50%.  Thermodilution cardiac output 3.85 L/min; cardiac index 2.0L/min/M2  Fick cardiac output 4.59 L/min and cardiac index 3.85 L/min per meter square  PVR 2.4 Wood units using Fick and 2.86 using thermal dilution. Overall, the patient is in left heart failure with poor cardiac output and will need inotropic support to clear pulmonary congestion and then hopefully reinitiate heart failure therapy.  [place] EKG SITE RITE  Result Date: 11/16/2020 If Site  Rite image not attached, placement could not be confirmed due to current cardiac rhythm.     Medications:     Scheduled Medications: . atorvastatin  40 mg Oral Daily  . calcium carbonate  1 tablet Oral BID WC  . Chlorhexidine Gluconate Cloth  6 each Topical Daily  . enoxaparin (LOVENOX) injection  40 mg Subcutaneous Q24H  . ezetimibe  10 mg Oral Daily  . fluticasone  2 spray Each Nare BID  . furosemide  80 mg Intravenous BID  . insulin aspart  0-15 Units Subcutaneous TID AC & HS  . multivitamin with minerals  1 tablet Oral Daily  . omega-3 acid ethyl esters  1 g Oral Daily  . pantoprazole  40 mg Oral Daily  . polyethylene glycol  17 g Oral Daily  . sodium chloride flush  10-40 mL Intracatheter Q12H  . sodium chloride flush  3 mL Intravenous Q12H  . sodium chloride flush  3 mL Intravenous Q12H     Infusions: . sodium chloride    . sodium chloride    . DOBUTamine 2.5 mcg/kg/min (11/16/20 1900)     PRN Medications:  sodium chloride, sodium chloride, acetaminophen, guaiFENesin-dextromethorphan, ondansetron (ZOFRAN) IV, sodium chloride flush, sodium chloride flush, sodium chloride flush   Assessment/Plan:   1. A/C Systolic Heart Failure,  ICM  - ECHO 2019 35-40%  - ECHO EF 20-25% RV normal Mod -Severe MR.  - RHC 11/16/20 - PCWP 25, RA 8, PA sat 46%  - Now on dobutamine 2.5 Co-ox remains 48% -> increase to 4 - Follow CVP and co-ox - Continue IV diuresis.  - BB and ARNI held due to hypotension.  - Intolerant SGT2i with yeast infections.  - No dig yet with AKI  - Will plan cMRI on Monday to re-evaluate LV and scar burden. May need advanced therapies. - Consider coronary angio as creatinine permits  2. Chronic Anemia  - EGD 2022 - normal. No recent colonoscopy - FOBT negative - Received feraheme this admit.  - SPEP, EPO pending - May need Heme input - On protonix.   3. CAD - Anterior MI 1997 PTCA LAD distal circ and 1st diagonal  DES 2011 - No recent coronary  angio due to AKI/CKD - Plan as above   4. AKI on CKD Stage IIIb - Creatinine baseline 1.6-1.8  - Improving with DBA - Follow renal function daily.     5. Severe MR - this is functional. HF likely too advanced for MitraClip     Length of Stay: 6   Athziri Freundlich MD 11/17/2020, 1:21 PM  Advanced Heart Failure Team Pager [phone] (M-F; 7a - 4p)  Please contact [facility] for night-coverage after hours (4p -7a ) and weekends on [email or web address]

## 2020-11-18 DIAGNOSIS — I5043 Acute on chronic combined systolic (congestive) and diastolic (congestive) heart failure: Secondary | ICD-10-CM | POA: Diagnosis not present

## 2020-11-18 LAB — CBC
HCT: 25.5 % — ABNORMAL LOW (ref 39.0–52.0)
Hemoglobin: 7.7 g/dL — ABNORMAL LOW (ref 13.0–17.0)
MCH: 24.1 pg — ABNORMAL LOW (ref 26.0–34.0)
MCHC: 30.2 g/dL (ref 30.0–36.0)
MCV: 79.9 fL — ABNORMAL LOW (ref 80.0–100.0)
Platelets: 220 10*3/uL (ref 150–400)
RBC: 3.19 MIL/uL — ABNORMAL LOW (ref 4.22–5.81)
RDW: 18.9 % — ABNORMAL HIGH (ref 11.5–15.5)
WBC: 7 10*3/uL (ref 4.0–10.5)
nRBC: 0 % (ref 0.0–0.2)

## 2020-11-18 LAB — GLUCOSE, CAPILLARY
Glucose-Capillary: 150 mg/dL — ABNORMAL HIGH (ref 70–99)
Glucose-Capillary: 247 mg/dL — ABNORMAL HIGH (ref 70–99)
Glucose-Capillary: 206 mg/dL — ABNORMAL HIGH (ref 70–99)
Glucose-Capillary: 183 mg/dL — ABNORMAL HIGH (ref 70–99)

## 2020-11-18 LAB — BASIC METABOLIC PANEL
Anion gap: 10 (ref 5–15)
BUN: 32 mg/dL — ABNORMAL HIGH (ref 8–23)
CO2: 24 mmol/L (ref 22–32)
Calcium: 8.7 mg/dL — ABNORMAL LOW (ref 8.9–10.3)
Chloride: 98 mmol/L (ref 98–111)
Creatinine, Ser: 1.52 mg/dL — ABNORMAL HIGH (ref 0.61–1.24)
GFR, Estimated: 49 mL/min — ABNORMAL LOW (ref >60)
Glucose, Bld: 350 mg/dL — ABNORMAL HIGH (ref 70–99)
Potassium: 4.3 mmol/L (ref 3.5–5.1)
Sodium: 132 mmol/L — ABNORMAL LOW (ref 135–145)

## 2020-11-18 LAB — HAPTOGLOBIN: Haptoglobin: 198 mg/dL (ref 32–363)

## 2020-11-18 LAB — COOXEMETRY PANEL
Carboxyhemoglobin: 1.5 % (ref 0.5–1.5)
Carboxyhemoglobin: 1.6 % — ABNORMAL HIGH (ref 0.5–1.5)
Methemoglobin: 1.1 % (ref 0.0–1.5)
Methemoglobin: 0.8 % (ref 0.0–1.5)
O2 Saturation: 62 %
O2 Saturation: 66.6 %
Total hemoglobin: 5.9 g/dL — CL (ref 12.0–16.0)
Total hemoglobin: 7.6 g/dL — ABNORMAL LOW (ref 12.0–16.0)

## 2020-11-18 LAB — ERYTHROPOIETIN: Erythropoietin: 50.4 m[IU]/mL — ABNORMAL HIGH (ref 2.6–18.5)

## 2020-11-18 LAB — PREPARE RBC (CROSSMATCH)

## 2020-11-18 MED ORDER — SODIUM CHLORIDE 0.9% IV SOLUTION: Freq: Once | INTRAVENOUS | Status: DC

## 2020-11-18 MED ORDER — CALCIUM CARBONATE ANTACID 500 MG PO CHEW
1.0000 | CHEWABLE_TABLET | Freq: Two times a day (BID) | ORAL | Status: DC | PRN
  Administered 2020-11-18 – 2020-12-04 (×14): 200 mg via ORAL
  Filled 2020-11-18 (×16): qty 1

## 2020-11-18 NOTE — Progress Notes (Addendum)
Patient ID: Marc [name], male   DOB: September 28, 1950, 70 y.o.   MRN: [id number]    Advanced Heart Failure Rounding Note   Subjective:    Dobutamine 2.5 started 3/31 for low output.  Co-ox 67% today.  SBP 100s.  CVP 11-12.  Good diuresis yesterday, weight down 4 lbs.  Still has a lot of lower leg swelling.  No dyspnea.    Hgb 5.9 on initial co-ox, 7.6 on repeat.  Formal CBC and BMET were not drawn this morning.   Objective:   Weight Range:  Vital Signs:   Temp:  [97.6 F (36.4 C)-98.9 F (37.2 C)] 97.6 F (36.4 C) (04/02 0842) Pulse Rate:  [100-114] 114 (04/02 0842) Resp:  [15-20] 20 (04/02 0842) BP: (91-118)/(59-70) 95/68 (04/02 0842) SpO2:  [98 %-100 %] 100 % (04/02 0840) Weight:  [77 kg] 77 kg (04/02 0410) Last BM Date: 11/18/20  Weight change: Filed Weights   11/16/20 0517 11/17/20 0548 11/18/20 0410  Weight: 80.8 kg 78.6 kg 77 kg    Intake/Output:   Intake/Output Summary (Last 24 hours) at 11/18/2020 1027 Last data filed at 11/18/2020 0800 Gross per 24 hour  Intake 430 ml  Output 2000 ml  Net -1570 ml     Physical Exam: General: NAD Neck: JVP 10 cm, no thyromegaly or thyroid nodule.  Lungs: Clear to auscultation bilaterally with normal respiratory effort. CV: Lateral PMI.  Heart regular S1/S2, no S3/S4, 2/6 HSM apex.  2+ edema to knees. Abdomen: Soft, nontender, no hepatosplenomegaly, no distention.  Skin: Intact without lesions or rashes.  Neurologic: Alert and oriented x 3.  Psych: Normal affect. Extremities: No clubbing or cyanosis.  HEENT: Normal.   Telemetry: Sinus 100s Personally reviewed  Labs: Basic Metabolic Panel: Recent Labs  Lab 11/11/20 2151 11/12/20 0253 11/13/20 0333 11/14/20 0410 11/15/20 0535 11/16/20 0356 11/16/20 1545 11/16/20 1546 11/17/20 0648  NA  --    < > 133* 131* 128* 127* 131* 130* 133*  K  --    < > 3.3* 4.1 4.3 4.5 4.2 4.2 3.8  CL  --    < > 100 99 95* 94*  --   --  99  CO2  --    < > '26 24 22 24  '$ --   --  25   GLUCOSE  --    < > 113* 82 108* 148*  --   --  119*  BUN  --    < > 25* 31* 42* 45*  --   --  39*  CREATININE  --    < > 1.63* 1.66* 1.90* 1.85*  --   --  1.66*  CALCIUM  --    < > 8.9 8.7* 8.8* 8.8*  --   --  8.6*  MG 2.0  --   --  1.8  --   --   --   --  2.2   < > = values in this interval not displayed.    Liver Function Tests: Recent Labs  Lab 11/11/20 2151  AST 31  ALT 25  ALKPHOS 55  BILITOT 1.4*  PROT 7.1  ALBUMIN 3.6   No results for input(s): LIPASE, AMYLASE in the last 168 hours. No results for input(s): AMMONIA in the last 168 hours.  CBC: Recent Labs  Lab 11/11/20 2151 11/12/20 0253 11/12/20 0851 11/13/20 0333 11/15/20 0535 11/16/20 1046 11/16/20 1545 11/16/20 1546 11/17/20 0648  WBC 7.5 7.9 6.4  --  5.6 6.4  --   --  5.3  NEUTROABS 5.9  --   --   --  4.4  --   --   --   --   HGB 8.9* 8.6* 8.0*   < > 7.7* 7.9* 8.2* 8.5* 7.0*  HCT 30.0* 26.9* 26.2*   < > 24.3* 26.3* 24.0* 25.0* 22.6*  MCV 81.7 80.3 80.4  --  78.6* 80.7  --   --  77.9*  PLT 250 284 233  --  197 253  --   --  213   < > = values in this interval not displayed.    Cardiac Enzymes: No results for input(s): CKTOTAL, CKMB, CKMBINDEX, TROPONINI in the last 168 hours.  BNP: BNP (last 3 results) Recent Labs    11/11/20 2158  BNP 1,728.5*    ProBNP (last 3 results) Recent Labs    10/24/20 1119  PROBNP 1,685*      Other results:  Imaging: CARDIAC CATHETERIZATION  Result Date: 11/16/2020  Mild pulmonary hypertension with mean PA pressure 36 mmHg  Mean pulmonary capillary wedge pressure 25 mmHg with V wave to 36 mmHg.  Pulmonary artery O2 saturation 46% and 50%.  Thermodilution cardiac output 3.85 L/min; cardiac index 2.0L/min/M2  Fick cardiac output 4.59 L/min and cardiac index 3.85 L/min per meter square  PVR 2.4 Wood units using Fick and 2.86 using thermal dilution. Overall, the patient is in left heart failure with poor cardiac output and will need inotropic support to  clear pulmonary congestion and then hopefully reinitiate heart failure therapy.  [place] EKG SITE RITE  Result Date: 11/16/2020 If Site Rite image not attached, placement could not be confirmed due to current cardiac rhythm.    Medications:     Scheduled Medications: . sodium chloride   Intravenous Once  . atorvastatin  40 mg Oral Daily  . calcium carbonate  1 tablet Oral BID WC  . Chlorhexidine Gluconate Cloth  6 each Topical Daily  . enoxaparin (LOVENOX) injection  40 mg Subcutaneous Q24H  . ezetimibe  10 mg Oral Daily  . fluticasone  2 spray Each Nare BID  . furosemide  80 mg Intravenous BID  . insulin aspart  0-15 Units Subcutaneous TID AC & HS  . multivitamin with minerals  1 tablet Oral Daily  . omega-3 acid ethyl esters  1 g Oral Daily  . pantoprazole  40 mg Oral Daily  . polyethylene glycol  17 g Oral Daily  . sodium chloride flush  10-40 mL Intracatheter Q12H  . sodium chloride flush  3 mL Intravenous Q12H  . sodium chloride flush  3 mL Intravenous Q12H    Infusions: . sodium chloride    . sodium chloride    . DOBUTamine 4 mcg/kg/min (11/17/20 1402)    PRN Medications: sodium chloride, sodium chloride, acetaminophen, guaiFENesin-dextromethorphan, ondansetron (ZOFRAN) IV, sodium chloride flush, sodium chloride flush, sodium chloride flush   Assessment/Plan:   1. A/C Systolic Heart Failure,  ICM  - ECHO 2019 35-40%  - ECHO EF 20-25% RV normal Mod -Severe MR.  - RHC 11/16/20 - PCWP 25, RA 8, PA sat 46%  - Now on dobutamine 4, co-ox 67%.  - CVP 11-12, will give Lasix 80 mg IV bid x 1 more day. Had good diuresis yesterday.  Need BMET drawn stat, d/w nursing.  - Unna boots with significant LE edema.  - BB and ARNI held due to hypotension.  - Intolerant SGT2i with yeast infections.  - No dig yet with AKI => no BMET yet today.  -  Will plan cMRI on Monday to re-evaluate LV and scar burden. May need advanced therapies. - Consider coronary angio as creatinine  permits  2. Chronic Anemia  - EGD 2022 - normal. No recent colonoscopy - FOBT negative - Received feraheme this admit.  - SPEP, EPO pending - May need Heme input - On protonix.  - hgb on initial co-ox this morning was 5.9, repeat 7.6 => need formal CBC sent stat (d/w nursing)  3. CAD - Anterior MI 1997 PTCA LAD distal circ and 1st diagonal DES 2011 - No recent coronary angio due to AKI/CKD - Plan as above   4. AKI on CKD Stage IIIb - Creatinine baseline 1.6-1.8  - Improving with DBA - Follow renal function daily => draw BMET now.     5. Severe MR - this is functional.  LV not markedly dilated with LVEDD 5.8 cm, could consider Mitraclip if patient is able to titrate off dobutamine and patient is not thought to be too advanced.     Length of Stay: 7   Loralie Champagne MD 11/18/2020, 10:27 AM  Advanced Heart Failure Team Pager [phone] (M-F; 7a - 4p)  Please contact [facility] for night-coverage after hours (4p -7a ) and weekends on [email or web address]

## 2020-11-18 NOTE — Progress Notes (Signed)
Orthopedic Tech Progress Note Patient Details:  Marc [name] [birth year] [id number]  Ortho Devices Type of Ortho Device: Louretta Parma boot Ortho Device/Splint Location: bi lateral Ortho Device/Splint Interventions: Ordered,Application,Adjustment   Post Interventions Patient Tolerated: Well Instructions Provided: Care of device,Adjustment of device   Karolee Stamps 11/18/2020, 9:10 PM

## 2020-11-19 DIAGNOSIS — I255 Ischemic cardiomyopathy: Secondary | ICD-10-CM | POA: Diagnosis not present

## 2020-11-19 DIAGNOSIS — I5023 Acute on chronic systolic (congestive) heart failure: Secondary | ICD-10-CM | POA: Diagnosis not present

## 2020-11-19 LAB — BASIC METABOLIC PANEL
Anion gap: 8 (ref 5–15)
BUN: 29 mg/dL — ABNORMAL HIGH (ref 8–23)
CO2: 27 mmol/L (ref 22–32)
Calcium: 8.8 mg/dL — ABNORMAL LOW (ref 8.9–10.3)
Chloride: 100 mmol/L (ref 98–111)
Creatinine, Ser: 1.52 mg/dL — ABNORMAL HIGH (ref 0.61–1.24)
GFR, Estimated: 49 mL/min — ABNORMAL LOW (ref >60)
Glucose, Bld: 168 mg/dL — ABNORMAL HIGH (ref 70–99)
Potassium: 3.8 mmol/L (ref 3.5–5.1)
Sodium: 135 mmol/L (ref 135–145)

## 2020-11-19 LAB — CBC
HCT: 23.6 % — ABNORMAL LOW (ref 39.0–52.0)
Hemoglobin: 7.2 g/dL — ABNORMAL LOW (ref 13.0–17.0)
MCH: 24.6 pg — ABNORMAL LOW (ref 26.0–34.0)
MCHC: 30.5 g/dL (ref 30.0–36.0)
MCV: 80.5 fL (ref 80.0–100.0)
Platelets: 224 10*3/uL (ref 150–400)
RBC: 2.93 MIL/uL — ABNORMAL LOW (ref 4.22–5.81)
RDW: 19.3 % — ABNORMAL HIGH (ref 11.5–15.5)
WBC: 7.2 10*3/uL (ref 4.0–10.5)
nRBC: 0 % (ref 0.0–0.2)

## 2020-11-19 LAB — GLUCOSE, CAPILLARY
Glucose-Capillary: 154 mg/dL — ABNORMAL HIGH (ref 70–99)
Glucose-Capillary: 292 mg/dL — ABNORMAL HIGH (ref 70–99)
Glucose-Capillary: 233 mg/dL — ABNORMAL HIGH (ref 70–99)
Glucose-Capillary: 299 mg/dL — ABNORMAL HIGH (ref 70–99)

## 2020-11-19 LAB — COOXEMETRY PANEL
Carboxyhemoglobin: 1.6 % — ABNORMAL HIGH (ref 0.5–1.5)
Methemoglobin: 1 % (ref 0.0–1.5)
O2 Saturation: 61.7 %
Total hemoglobin: 7.2 g/dL — ABNORMAL LOW (ref 12.0–16.0)

## 2020-11-19 LAB — PREPARE RBC (CROSSMATCH)

## 2020-11-19 LAB — MAGNESIUM: Magnesium: 2 mg/dL (ref 1.7–2.4)

## 2020-11-19 MED ORDER — SODIUM CHLORIDE 0.9% IV SOLUTION: Freq: Once | INTRAVENOUS | Status: AC

## 2020-11-19 MED ORDER — METOLAZONE 5 MG PO TABS
5.0000 mg | ORAL_TABLET | Freq: Once | ORAL | Status: AC
  Administered 2020-11-19: 5 mg via ORAL
  Filled 2020-11-19: qty 1

## 2020-11-19 MED ORDER — POTASSIUM CHLORIDE CRYS ER 20 MEQ PO TBCR
40.0000 meq | EXTENDED_RELEASE_TABLET | Freq: Once | ORAL | Status: AC
  Administered 2020-11-19: 40 meq via ORAL
  Filled 2020-11-19: qty 2

## 2020-11-19 NOTE — Progress Notes (Signed)
Patient ID: Marc [name], male   DOB: 1951/05/27, 70 y.o.   MRN: [id number]    Advanced Heart Failure Rounding Note   Subjective:    Dobutamine 2.5 started 3/31. Now on DBA 4. Co-ox 62% CVP 8-9  On IV lasix. Modest diuresis. Weight unchanged  Feels much better. No SOB, orthopnea or PND but still tachycardic.   Hgb remains low at 7.2   Objective:   Weight Range:  Vital Signs:   Temp:  [97.9 F (36.6 C)-98.2 F (36.8 C)] 98.1 F (36.7 C) (04/03 0805) Pulse Rate:  [102-116] 116 (04/03 0805) Resp:  [17-22] 18 (04/03 0805) BP: (98-113)/(65-77) 113/77 (04/03 0805) SpO2:  [98 %-100 %] 100 % (04/03 0805) Weight:  [76.8 kg] 76.8 kg (04/03 0046) Last BM Date: 11/19/20  Weight change: Filed Weights   11/17/20 0548 11/18/20 0410 11/19/20 0046  Weight: 78.6 kg 77 kg 76.8 kg    Intake/Output:   Intake/Output Summary (Last 24 hours) at 11/19/2020 1025 Last data filed at 11/19/2020 1024 Gross per 24 hour  Intake 1261.38 ml  Output 1801 ml  Net -539.62 ml     Physical Exam: General:  Sitting up in chair. No resp difficulty HEENT: normal Neck: supple. JVP 8-9 Carotids 2+ bilat; no bruits. No lymphadenopathy or thryomegaly appreciated. Cor: PMI nondisplaced. Regular tachy. 2/6 MR Lungs: clear Abdomen: soft, nontender, nondistended. No hepatosplenomegaly. No bruits or masses. Good bowel sounds. Extremities: no cyanosis, clubbing, rash, UNNA boots 2+edema Neuro: alert & orientedx3, cranial nerves grossly intact. moves all 4 extremities w/o difficulty. Affect pleasant    Telemetry: Sinus 100-120 Personally reviewed  Labs: Basic Metabolic Panel: Recent Labs  Lab 11/14/20 0410 11/15/20 0535 11/16/20 0356 11/16/20 1545 11/16/20 1546 11/17/20 0648 11/18/20 1111 11/19/20 0500  NA 131* 128* 127* 131* 130* 133* 132* 135  K 4.1 4.3 4.5 4.2 4.2 3.8 4.3 3.8  CL 99 95* 94*  --   --  99 98 100  CO2 '24 22 24  '$ --   --  '25 24 27  '$ GLUCOSE 82 108* 148*  --   --  119* 350*  168*  BUN 31* 42* 45*  --   --  39* 32* 29*  CREATININE 1.66* 1.90* 1.85*  --   --  1.66* 1.52* 1.52*  CALCIUM 8.7* 8.8* 8.8*  --   --  8.6* 8.7* 8.8*  MG 1.8  --   --   --   --  2.2  --  2.0    Liver Function Tests: No results for input(s): AST, ALT, ALKPHOS, BILITOT, PROT, ALBUMIN in the last 168 hours. No results for input(s): LIPASE, AMYLASE in the last 168 hours. No results for input(s): AMMONIA in the last 168 hours.  CBC: Recent Labs  Lab 11/15/20 0535 11/16/20 1046 11/16/20 1545 11/16/20 1546 11/17/20 0648 11/18/20 1111 11/19/20 0500  WBC 5.6 6.4  --   --  5.3 7.0 7.2  NEUTROABS 4.4  --   --   --   --   --   --   HGB 7.7* 7.9* 8.2* 8.5* 7.0* 7.7* 7.2*  HCT 24.3* 26.3* 24.0* 25.0* 22.6* 25.5* 23.6*  MCV 78.6* 80.7  --   --  77.9* 79.9* 80.5  PLT 197 253  --   --  213 220 224    Cardiac Enzymes: No results for input(s): CKTOTAL, CKMB, CKMBINDEX, TROPONINI in the last 168 hours.  BNP: BNP (last 3 results) Recent Labs    11/11/20 2158  BNP  1,728.5*    ProBNP (last 3 results) Recent Labs    10/24/20 1119  PROBNP 1,685*      Other results:  Imaging: No results found.   Medications:     Scheduled Medications: . sodium chloride   Intravenous Once  . atorvastatin  40 mg Oral Daily  . calcium carbonate  1 tablet Oral BID WC  . Chlorhexidine Gluconate Cloth  6 each Topical Daily  . enoxaparin (LOVENOX) injection  40 mg Subcutaneous Q24H  . ezetimibe  10 mg Oral Daily  . fluticasone  2 spray Each Nare BID  . furosemide  80 mg Intravenous BID  . insulin aspart  0-15 Units Subcutaneous TID AC & HS  . multivitamin with minerals  1 tablet Oral Daily  . omega-3 acid ethyl esters  1 g Oral Daily  . pantoprazole  40 mg Oral Daily  . polyethylene glycol  17 g Oral Daily  . sodium chloride flush  10-40 mL Intracatheter Q12H  . sodium chloride flush  3 mL Intravenous Q12H  . sodium chloride flush  3 mL Intravenous Q12H    Infusions: . sodium chloride     . sodium chloride    . DOBUTamine 4 mcg/kg/min (11/19/20 0846)    PRN Medications: sodium chloride, sodium chloride, acetaminophen, calcium carbonate, guaiFENesin-dextromethorphan, ondansetron (ZOFRAN) IV, sodium chloride flush, sodium chloride flush, sodium chloride flush   Assessment/Plan:   1. A/C Systolic Heart Failure,  ICM  - ECHO 2019 35-40%  - ECHO EF 20-25% RV normal Mod -Severe MR.  - RHC 11/16/20 - PCWP 25, RA 8, PA sat 46%  - Now on dobutamine 4, co-ox 62%.  - Remains on IV lasix. Weight unchanged. Will give a dose of metolazone today.  - CVP 8-9 - Unna boots in place. Still with significant LE edema.  - BB and ARNI held due to hypotension.  - Intolerant SGT2i with yeast infections.  - No dig yet with AKI  - Will plan cMRI tomorrow to re-evaluate LV and scar burden. May need advanced therapies. - Consider coronary angio early this week depending on MRI results and renal function  2. Chronic Anemia, iron deficient - EGD 2022 - normal. No recent colonoscopy - FOBT negative - Received feraheme this admit.  - SPEP negative, EPO high, Retics high. LDH ok . - seems like chronic blood loss anemia. If considering advanced therapies will likely need to proceed with colonoscopy prior. - On protonix.  - hgb 7.2 will need 1u RBCs today. ? Repeat feraheme. Billey Gosling d/w PharmD  3. CAD - Anterior MI 1997 PTCA LAD distal circ and 1st diagonal DES 2011 - No recent coronary angio due to AKI/CKD - Plan as above. Cath this week  4. AKI on CKD Stage IIIb - Creatinine baseline 1.6-1.8  - Improving with DBA. Now 1.5   5. Severe MR - this is functional.  LV not markedly dilated with LVEDD 5.8 cm, could consider Mitraclip if patient is able to titrate off dobutamine and patient is not thought to be too advanced.     Length of Stay: 8   Glori Bickers MD 11/19/2020, 10:25 AM  Advanced Heart Failure Team Pager [phone] (M-F; 7a - 4p)  Please contact [facility] for  night-coverage after hours (4p -7a ) and weekends on [email or web address]

## 2020-11-19 NOTE — Progress Notes (Signed)
   11/19/20 0805  Assess: MEWS Score  Temp 98.1 F (36.7 C)  BP 113/77  Pulse Rate (!) 116  Resp 18  Level of Consciousness Alert  SpO2 100 %  O2 Device Nasal Cannula  O2 Flow Rate (L/min) 3 L/min  Assess: MEWS Score  MEWS Temp 0  MEWS Systolic 0  MEWS Pulse 2  MEWS RR 0  MEWS LOC 0  MEWS Score 2  MEWS Score Color Yellow  Assess: if the MEWS score is Yellow or Red  Were vital signs taken at a resting state? Yes  Focused Assessment No change from prior assessment  Early Detection of Sepsis Score *See Row Information* Low  MEWS guidelines implemented *See Row Information* No, previously yellow, continue vital signs every 4 hours  Treat  MEWS Interventions Administered scheduled meds/treatments  Pain Scale 0-10  Pain Score 0

## 2020-11-19 NOTE — Progress Notes (Signed)
   11/19/20 0359  Assess: MEWS Score  Temp 97.9 F (36.6 C)  BP 99/65  Pulse Rate (!) 107  ECG Heart Rate (!) 106  Resp 18  SpO2 98 %  Assess: MEWS Score  MEWS Temp 0  MEWS Systolic 1  MEWS Pulse 1  MEWS RR 0  MEWS LOC 0  MEWS Score 2  MEWS Score Color Yellow  Assess: if the MEWS score is Yellow or Red  Were vital signs taken at a resting state? Yes  Focused Assessment No change from prior assessment  Early Detection of Sepsis Score *See Row Information* Low  MEWS guidelines implemented *See Row Information* No, previously yellow, continue vital signs every 4 hours  Document  Patient Outcome Stabilized after interventions  Progress note created (see row info) Yes

## 2020-11-19 NOTE — Progress Notes (Signed)
   11/18/20 1950  Assess: MEWS Score  Level of Consciousness Alert  Assess: MEWS Score  MEWS Temp 0  MEWS Systolic 1  MEWS Pulse 1  MEWS RR 0  MEWS LOC 0  MEWS Score 2  MEWS Score Color Yellow  Assess: if the MEWS score is Yellow or Red  Were vital signs taken at a resting state? Yes  Focused Assessment No change from prior assessment  Early Detection of Sepsis Score *See Row Information* Low  MEWS guidelines implemented *See Row Information* No, previously yellow, continue vital signs every 4 hours  Document  Patient Outcome Stabilized after interventions  Progress note created (see row info) Yes

## 2020-11-19 NOTE — Progress Notes (Signed)
   11/18/20 2000  Assess: MEWS Score  Temp 98.2 F (36.8 C)  Resp (!) 22  O2 Device Nasal Cannula  O2 Flow Rate (L/min) 3 L/min  Assess: MEWS Score  MEWS Temp 0  MEWS Systolic 1  MEWS Pulse 1  MEWS RR 1  MEWS LOC 0  MEWS Score 3  MEWS Score Color Yellow  Assess: if the MEWS score is Yellow or Red  Were vital signs taken at a resting state? Yes  Focused Assessment No change from prior assessment  Early Detection of Sepsis Score *See Row Information* Low  MEWS guidelines implemented *See Row Information* No, previously yellow, continue vital signs every 4 hours  Document  Patient Outcome Stabilized after interventions  Progress note created (see row info) Yes

## 2020-11-20 ENCOUNTER — Inpatient Hospital Stay (HOSPITAL_COMMUNITY): Payer: HMO

## 2020-11-20 ENCOUNTER — Other Ambulatory Visit (HOSPITAL_COMMUNITY): Payer: Self-pay | Admitting: *Deleted

## 2020-11-20 DIAGNOSIS — I5042 Chronic combined systolic (congestive) and diastolic (congestive) heart failure: Secondary | ICD-10-CM

## 2020-11-20 DIAGNOSIS — I255 Ischemic cardiomyopathy: Secondary | ICD-10-CM

## 2020-11-20 LAB — BPAM RBC
Blood Product Expiration Date: 202204212359
ISSUE DATE / TIME: 202204031548
Unit Type and Rh: 8400

## 2020-11-20 LAB — MULTIPLE MYELOMA PANEL, SERUM
Albumin SerPl Elph-Mcnc: 2.9 g/dL (ref 2.9–4.4)
Albumin/Glob SerPl: 1.1 (ref 0.7–1.7)
Alpha 1: 0.3 g/dL (ref 0.0–0.4)
Alpha2 Glob SerPl Elph-Mcnc: 0.9 g/dL (ref 0.4–1.0)
B-Globulin SerPl Elph-Mcnc: 0.8 g/dL (ref 0.7–1.3)
Gamma Glob SerPl Elph-Mcnc: 0.9 g/dL (ref 0.4–1.8)
Globulin, Total: 2.9 g/dL (ref 2.2–3.9)
IgA: 197 mg/dL (ref 61–437)
IgG (Immunoglobin G), Serum: 826 mg/dL (ref 603–1613)
IgM (Immunoglobulin M), Srm: 74 mg/dL (ref 20–172)
Total Protein ELP: 5.8 g/dL — ABNORMAL LOW (ref 6.0–8.5)

## 2020-11-20 LAB — BASIC METABOLIC PANEL
Anion gap: 9 (ref 5–15)
Anion gap: 8 (ref 5–15)
BUN: 28 mg/dL — ABNORMAL HIGH (ref 8–23)
BUN: 24 mg/dL — ABNORMAL HIGH (ref 8–23)
CO2: 29 mmol/L (ref 22–32)
CO2: 29 mmol/L (ref 22–32)
Calcium: 9 mg/dL (ref 8.9–10.3)
Calcium: 9.2 mg/dL (ref 8.9–10.3)
Chloride: 100 mmol/L (ref 98–111)
Chloride: 98 mmol/L (ref 98–111)
Creatinine, Ser: 1.57 mg/dL — ABNORMAL HIGH (ref 0.61–1.24)
Creatinine, Ser: 1.48 mg/dL — ABNORMAL HIGH (ref 0.61–1.24)
GFR, Estimated: 47 mL/min — ABNORMAL LOW (ref >60)
GFR, Estimated: 51 mL/min — ABNORMAL LOW (ref >60)
Glucose, Bld: 128 mg/dL — ABNORMAL HIGH (ref 70–99)
Glucose, Bld: 219 mg/dL — ABNORMAL HIGH (ref 70–99)
Potassium: 3.8 mmol/L (ref 3.5–5.1)
Potassium: 4.1 mmol/L (ref 3.5–5.1)
Sodium: 138 mmol/L (ref 135–145)
Sodium: 135 mmol/L (ref 135–145)

## 2020-11-20 LAB — CBC
HCT: 26.4 % — ABNORMAL LOW (ref 39.0–52.0)
Hemoglobin: 8.3 g/dL — ABNORMAL LOW (ref 13.0–17.0)
MCH: 25.4 pg — ABNORMAL LOW (ref 26.0–34.0)
MCHC: 31.4 g/dL (ref 30.0–36.0)
MCV: 80.7 fL (ref 80.0–100.0)
Platelets: 211 10*3/uL (ref 150–400)
RBC: 3.27 MIL/uL — ABNORMAL LOW (ref 4.22–5.81)
RDW: 18.5 % — ABNORMAL HIGH (ref 11.5–15.5)
WBC: 8 10*3/uL (ref 4.0–10.5)
nRBC: 0 % (ref 0.0–0.2)

## 2020-11-20 LAB — TYPE AND SCREEN
ABO/RH(D): AB POS
Antibody Screen: NEGATIVE
Unit division: 0

## 2020-11-20 LAB — GLUCOSE, CAPILLARY
Glucose-Capillary: 133 mg/dL — ABNORMAL HIGH (ref 70–99)
Glucose-Capillary: 164 mg/dL — ABNORMAL HIGH (ref 70–99)
Glucose-Capillary: 196 mg/dL — ABNORMAL HIGH (ref 70–99)
Glucose-Capillary: 266 mg/dL — ABNORMAL HIGH (ref 70–99)

## 2020-11-20 LAB — COOXEMETRY PANEL
Carboxyhemoglobin: 1.8 % — ABNORMAL HIGH (ref 0.5–1.5)
Methemoglobin: 0.9 % (ref 0.0–1.5)
O2 Saturation: 73.5 %
Total hemoglobin: 8.4 g/dL — ABNORMAL LOW (ref 12.0–16.0)

## 2020-11-20 MED ORDER — TORSEMIDE 20 MG PO TABS
40.0000 mg | ORAL_TABLET | Freq: Every day | ORAL | Status: DC
  Administered 2020-11-20: 40 mg via ORAL
  Filled 2020-11-20: qty 2

## 2020-11-20 MED ORDER — TORSEMIDE 20 MG PO TABS
40.0000 mg | ORAL_TABLET | Freq: Two times a day (BID) | ORAL | Status: DC
  Administered 2020-11-20 – 2020-11-22 (×3): 40 mg via ORAL
  Filled 2020-11-20 (×3): qty 2

## 2020-11-20 MED ORDER — GADOBUTROL 1 MMOL/ML IV SOLN
8.0000 mL | Freq: Once | INTRAVENOUS | Status: AC | PRN
  Administered 2020-11-20: 8 mL via INTRAVENOUS

## 2020-11-20 NOTE — Care Management Important Message (Signed)
Important Message  Patient Details  Name: Marc [name] MRN: [id number] Date of Birth: [birth year]   Medicare Important Message Given:  Yes     [name] 11/20/2020, 8:57 AM

## 2020-11-20 NOTE — Progress Notes (Addendum)
Patient ID: Marc [name], male   DOB: 03-24-1951, 70 y.o.   MRN: [id number]    Advanced Heart Failure Rounding Note   Subjective:    Dobutamine 2.5 started 3/31 for low output.  Had low CO-OX Dobtuamine increased to 4 mcg. CO-OX 73.5%  4/3 received 1UPRBCs. Hgb 7.2>8.3   CVP 4-5 today .     Feels ok. Denies SOB.   Objective:   Weight Range:  Vital Signs:   Temp:  [97.9 F (36.6 C)-98.9 F (37.2 C)] 98.7 F (37.1 C) (04/04 0400) Pulse Rate:  [101-110] 107 (04/04 0400) Resp:  [15-19] 15 (04/04 0400) BP: (101-114)/(60-77) 105/69 (04/04 0400) SpO2:  [97 %-100 %] 100 % (04/04 0400) Weight:  [75.9 kg] 75.9 kg (04/04 0500) Last BM Date: 11/19/20  Weight change: Filed Weights   11/19/20 0046 11/20/20 0041 11/20/20 0500  Weight: 76.8 kg 75.9 kg 75.9 kg    Intake/Output:   Intake/Output Summary (Last 24 hours) at 11/20/2020 0813 Last data filed at 11/20/2020 0600 Gross per 24 hour  Intake 1206.04 ml  Output 2450 ml  Net -1243.96 ml    CVP 4-5 Physical Exam: General:  No resp difficulty HEENT: normal Neck: supple. no JVD. Carotids 2+ bilat; no bruits. No lymphadenopathy or thryomegaly appreciated. Cor: PMI nondisplaced. Regular rate & rhythm. No rubs, 2/6 MR  + S3 Lungs: clear Abdomen: soft, nontender, nondistended. No hepatosplenomegaly. No bruits or masses. Good bowel sounds. Extremities: no cyanosis, clubbing, rash, R and LLE unna boots.  Neuro: alert & orientedx3, cranial nerves grossly intact. moves all 4 extremities w/o difficulty. Affect pleasant  Telemetry: ST 110-120s  with occasional PVCs.   Labs: Basic Metabolic Panel: Recent Labs  Lab 11/14/20 0410 11/15/20 0535 11/16/20 0356 11/16/20 1545 11/16/20 1546 11/17/20 0648 11/18/20 1111 11/19/20 0500 11/20/20 0540  NA 131*   < > 127*   < > 130* 133* 132* 135 138  K 4.1   < > 4.5   < > 4.2 3.8 4.3 3.8 3.8  CL 99   < > 94*  --   --  99 98 100 100  CO2 24   < > 24  --   --  '25 24 27 29  '$ GLUCOSE  82   < > 148*  --   --  119* 350* 168* 128*  BUN 31*   < > 45*  --   --  39* 32* 29* 28*  CREATININE 1.66*   < > 1.85*  --   --  1.66* 1.52* 1.52* 1.57*  CALCIUM 8.7*   < > 8.8*  --   --  8.6* 8.7* 8.8* 9.0  MG 1.8  --   --   --   --  2.2  --  2.0  --    < > = values in this interval not displayed.    Liver Function Tests: No results for input(s): AST, ALT, ALKPHOS, BILITOT, PROT, ALBUMIN in the last 168 hours. No results for input(s): LIPASE, AMYLASE in the last 168 hours. No results for input(s): AMMONIA in the last 168 hours.  CBC: Recent Labs  Lab 11/15/20 0535 11/16/20 1046 11/16/20 1545 11/16/20 1546 11/17/20 0648 11/18/20 1111 11/19/20 0500 11/20/20 0540  WBC 5.6 6.4  --   --  5.3 7.0 7.2 8.0  NEUTROABS 4.4  --   --   --   --   --   --   --   HGB 7.7* 7.9*   < > 8.5* 7.0*  7.7* 7.2* 8.3*  HCT 24.3* 26.3*   < > 25.0* 22.6* 25.5* 23.6* 26.4*  MCV 78.6* 80.7  --   --  77.9* 79.9* 80.5 80.7  PLT 197 253  --   --  213 220 224 211   < > = values in this interval not displayed.    Cardiac Enzymes: No results for input(s): CKTOTAL, CKMB, CKMBINDEX, TROPONINI in the last 168 hours.  BNP: BNP (last 3 results) Recent Labs    11/11/20 2158  BNP 1,728.5*    ProBNP (last 3 results) Recent Labs    10/24/20 1119  PROBNP 1,685*      Other results:  Imaging: No results found.   Medications:     Scheduled Medications: . sodium chloride   Intravenous Once  . atorvastatin  40 mg Oral Daily  . calcium carbonate  1 tablet Oral BID WC  . Chlorhexidine Gluconate Cloth  6 each Topical Daily  . enoxaparin (LOVENOX) injection  40 mg Subcutaneous Q24H  . ezetimibe  10 mg Oral Daily  . fluticasone  2 spray Each Nare BID  . furosemide  80 mg Intravenous BID  . insulin aspart  0-15 Units Subcutaneous TID AC & HS  . multivitamin with minerals  1 tablet Oral Daily  . omega-3 acid ethyl esters  1 g Oral Daily  . pantoprazole  40 mg Oral Daily  . polyethylene glycol  17 g  Oral Daily  . sodium chloride flush  10-40 mL Intracatheter Q12H  . sodium chloride flush  3 mL Intravenous Q12H  . sodium chloride flush  3 mL Intravenous Q12H    Infusions: . sodium chloride    . sodium chloride    . DOBUTamine 4 mcg/kg/min (11/19/20 0846)    PRN Medications: sodium chloride, sodium chloride, acetaminophen, calcium carbonate, guaiFENesin-dextromethorphan, ondansetron (ZOFRAN) IV, sodium chloride flush, sodium chloride flush, sodium chloride flush   Assessment/Plan:   1. A/C Systolic Heart Failure,  ICM  - ECHO 2019 35-40%  - ECHO EF 20-25% RV normal Mod -Severe MR.  - RHC 11/16/20 - PCWP 25, RA 8, PA sat 46%  - Now on dobutamine 4, co-ox 73.5%  --> start to wean today. Cut back to 3 mcg. - CVP 4-5 . Stop IV lasix. Start torsemide 40 mg daily.  - Unna boots with significant LE edema.  - BB and ARNI held due to hypotension.  - Intolerant SGT2i with yeast infections.  - No dig yet with AKI =>  - Will plan cMRI on Monday to re-evaluate LV and scar burden. May need advanced therapies. - Consider coronary angio as creatinine permits  2. Chronic Anemia  - EGD 2022 - normal. No recent colonoscopy - FOBT negative - Received feraheme this admit.  - SPEP pending  -  EPO 50  -4/3 Given 1UPRBCs. Hgb 7.2>8.3  - May need Heme input - On protonix.   -3. CAD - Anterior MI 1997 PTCA LAD distal circ and 1st diagonal DES 2011 - No recent coronary angio due to AKI/CKD -No chest pain.    4. AKI on CKD Stage IIIb - Creatinine baseline 1.6-1.8 , creatinine 1.6 today.  - Improving with DBA - Follow renal function daily     5. Severe MR - this is functional.  LV not markedly dilated with LVEDD 5.8 cm, could consider Mitraclip if patient is able to titrate off dobutamine and patient is not thought to be too advanced.   Start to wean dobutamine today. CMRI today.  Length of Stay: [place] NP-C   11/20/2020, 8:13 AM  Advanced Heart Failure Team Pager  [phone] (M-F; 7a - 4p)  Please contact [facility] for night-coverage after hours (4p -7a ) and weekends on [email or web address]  Patient seen and examined with the above-signed Advanced Practice Provider and/or Housestaff. I personally reviewed laboratory data, imaging studies and relevant notes. I independently examined the patient and formulated the important aspects of the plan. I have edited the note to reflect any of my changes or salient points. I have personally discussed the plan with the patient and/or family.  Remains on dobutamine 4. Good diuresis yesterday on IV lasix and metolazone. Weight down 2 pounds.  Also received 1u RBC.   Feeling better. No orthopnea or PND. cMRI completed but not read  General:  Sitting in chair . No resp difficulty HEENT: normal Neck: supple. JVP 9-10. Carotids 2+ bilat; no bruits. No lymphadenopathy or thryomegaly appreciated. Cor: PMI nondisplaced. Regular rate & rhythm. 3/6 MR Lungs: clear Abdomen: soft, nontender, nondistended. No hepatosplenomegaly. No bruits or masses. Good bowel sounds. Extremities: no cyanosis, clubbing, rash, 2+ edema Neuro: alert & orientedx3, cranial nerves grossly intact. moves all 4 extremities w/o difficulty. Affect pleasant  Agree with slow wean of dobutamine. Volume status still appears elevated. Would continue IV diuresis. Await cMRI results. Will likely plan coronary angio tomorrow or Wednesday.   Long d/w patient and his family and family friend, Dr. Leland Her (cardiac anesthesiology in [place]).  Total time spent 45 minutes. Over half that time spent discussing above.   Glori Bickers, MD  7:08 PM

## 2020-11-20 NOTE — H&P (View-Only) (Signed)
Patient ID: EMMAUEL ERK, male   DOB: 06-11-1951, 70 y.o.   MRN: [id number]    Advanced Heart Failure Rounding Note   Subjective:    Dobutamine 2.5 started 3/31 for low output.  Had low CO-OX Dobtuamine increased to 4 mcg. CO-OX 73.5%  4/3 received 1UPRBCs. Hgb 7.2>8.3   CVP 4-5 today .     Feels ok. Denies SOB.   Objective:   Weight Range:  Vital Signs:   Temp:  [97.9 F (36.6 C)-98.9 F (37.2 C)] 98.7 F (37.1 C) (04/04 0400) Pulse Rate:  [101-110] 107 (04/04 0400) Resp:  [15-19] 15 (04/04 0400) BP: (101-114)/(60-77) 105/69 (04/04 0400) SpO2:  [97 %-100 %] 100 % (04/04 0400) Weight:  [75.9 kg] 75.9 kg (04/04 0500) Last BM Date: 11/19/20  Weight change: Filed Weights   11/19/20 0046 11/20/20 0041 11/20/20 0500  Weight: 76.8 kg 75.9 kg 75.9 kg    Intake/Output:   Intake/Output Summary (Last 24 hours) at 11/20/2020 0813 Last data filed at 11/20/2020 0600 Gross per 24 hour  Intake 1206.04 ml  Output 2450 ml  Net -1243.96 ml    CVP 4-5 Physical Exam: General:  No resp difficulty HEENT: normal Neck: supple. no JVD. Carotids 2+ bilat; no bruits. No lymphadenopathy or thryomegaly appreciated. Cor: PMI nondisplaced. Regular rate & rhythm. No rubs, 2/6 MR  + S3 Lungs: clear Abdomen: soft, nontender, nondistended. No hepatosplenomegaly. No bruits or masses. Good bowel sounds. Extremities: no cyanosis, clubbing, rash, R and LLE unna boots.  Neuro: alert & orientedx3, cranial nerves grossly intact. moves all 4 extremities w/o difficulty. Affect pleasant  Telemetry: ST 110-120s  with occasional PVCs.   Labs: Basic Metabolic Panel: Recent Labs  Lab 11/14/20 0410 11/15/20 0535 11/16/20 0356 11/16/20 1545 11/16/20 1546 11/17/20 0648 11/18/20 1111 11/19/20 0500 11/20/20 0540  NA 131*   < > 127*   < > 130* 133* 132* 135 138  K 4.1   < > 4.5   < > 4.2 3.8 4.3 3.8 3.8  CL 99   < > 94*  --   --  99 98 100 100  CO2 24   < > 24  --   --  '25 24 27 29  '$ GLUCOSE  82   < > 148*  --   --  119* 350* 168* 128*  BUN 31*   < > 45*  --   --  39* 32* 29* 28*  CREATININE 1.66*   < > 1.85*  --   --  1.66* 1.52* 1.52* 1.57*  CALCIUM 8.7*   < > 8.8*  --   --  8.6* 8.7* 8.8* 9.0  MG 1.8  --   --   --   --  2.2  --  2.0  --    < > = values in this interval not displayed.    Liver Function Tests: No results for input(s): AST, ALT, ALKPHOS, BILITOT, PROT, ALBUMIN in the last 168 hours. No results for input(s): LIPASE, AMYLASE in the last 168 hours. No results for input(s): AMMONIA in the last 168 hours.  CBC: Recent Labs  Lab 11/15/20 0535 11/16/20 1046 11/16/20 1545 11/16/20 1546 11/17/20 0648 11/18/20 1111 11/19/20 0500 11/20/20 0540  WBC 5.6 6.4  --   --  5.3 7.0 7.2 8.0  NEUTROABS 4.4  --   --   --   --   --   --   --   HGB 7.7* 7.9*   < > 8.5* 7.0*  7.7* 7.2* 8.3*  HCT 24.3* 26.3*   < > 25.0* 22.6* 25.5* 23.6* 26.4*  MCV 78.6* 80.7  --   --  77.9* 79.9* 80.5 80.7  PLT 197 253  --   --  213 220 224 211   < > = values in this interval not displayed.    Cardiac Enzymes: No results for input(s): CKTOTAL, CKMB, CKMBINDEX, TROPONINI in the last 168 hours.  BNP: BNP (last 3 results) Recent Labs    11/11/20 2158  BNP 1,728.5*    ProBNP (last 3 results) Recent Labs    10/24/20 1119  PROBNP 1,685*      Other results:  Imaging: No results found.   Medications:     Scheduled Medications: . sodium chloride   Intravenous Once  . atorvastatin  40 mg Oral Daily  . calcium carbonate  1 tablet Oral BID WC  . Chlorhexidine Gluconate Cloth  6 each Topical Daily  . enoxaparin (LOVENOX) injection  40 mg Subcutaneous Q24H  . ezetimibe  10 mg Oral Daily  . fluticasone  2 spray Each Nare BID  . furosemide  80 mg Intravenous BID  . insulin aspart  0-15 Units Subcutaneous TID AC & HS  . multivitamin with minerals  1 tablet Oral Daily  . omega-3 acid ethyl esters  1 g Oral Daily  . pantoprazole  40 mg Oral Daily  . polyethylene glycol  17 g  Oral Daily  . sodium chloride flush  10-40 mL Intracatheter Q12H  . sodium chloride flush  3 mL Intravenous Q12H  . sodium chloride flush  3 mL Intravenous Q12H    Infusions: . sodium chloride    . sodium chloride    . DOBUTamine 4 mcg/kg/min (11/19/20 0846)    PRN Medications: sodium chloride, sodium chloride, acetaminophen, calcium carbonate, guaiFENesin-dextromethorphan, ondansetron (ZOFRAN) IV, sodium chloride flush, sodium chloride flush, sodium chloride flush   Assessment/Plan:   1. A/C Systolic Heart Failure,  ICM  - ECHO 2019 35-40%  - ECHO EF 20-25% RV normal Mod -Severe MR.  - RHC 11/16/20 - PCWP 25, RA 8, PA sat 46%  - Now on dobutamine 4, co-ox 73.5%  --> start to wean today. Cut back to 3 mcg. - CVP 4-5 . Stop IV lasix. Start torsemide 40 mg daily.  - Unna boots with significant LE edema.  - BB and ARNI held due to hypotension.  - Intolerant SGT2i with yeast infections.  - No dig yet with AKI =>  - Will plan cMRI on Monday to re-evaluate LV and scar burden. May need advanced therapies. - Consider coronary angio as creatinine permits  2. Chronic Anemia  - EGD 2022 - normal. No recent colonoscopy - FOBT negative - Received feraheme this admit.  - SPEP pending  -  EPO 50  -4/3 Given 1UPRBCs. Hgb 7.2>8.3  - May need Heme input - On protonix.   -3. CAD - Anterior MI 1997 PTCA LAD distal circ and 1st diagonal DES 2011 - No recent coronary angio due to AKI/CKD -No chest pain.    4. AKI on CKD Stage IIIb - Creatinine baseline 1.6-1.8 , creatinine 1.6 today.  - Improving with DBA - Follow renal function daily     5. Severe MR - this is functional.  LV not markedly dilated with LVEDD 5.8 cm, could consider Mitraclip if patient is able to titrate off dobutamine and patient is not thought to be too advanced.   Start to wean dobutamine today. CMRI today.  Length of Stay: [place] NP-C   11/20/2020, 8:13 AM  Advanced Heart Failure Team Pager  [phone] (M-F; 7a - 4p)  Please contact [facility] for night-coverage after hours (4p -7a ) and weekends on [email or web address]  Patient seen and examined with the above-signed Advanced Practice Provider and/or Housestaff. I personally reviewed laboratory data, imaging studies and relevant notes. I independently examined the patient and formulated the important aspects of the plan. I have edited the note to reflect any of my changes or salient points. I have personally discussed the plan with the patient and/or family.  Remains on dobutamine 4. Good diuresis yesterday on IV lasix and metolazone. Weight down 2 pounds.  Also received 1u RBC.   Feeling better. No orthopnea or PND. cMRI completed but not read  General:  Sitting in chair . No resp difficulty HEENT: normal Neck: supple. JVP 9-10. Carotids 2+ bilat; no bruits. No lymphadenopathy or thryomegaly appreciated. Cor: PMI nondisplaced. Regular rate & rhythm. 3/6 MR Lungs: clear Abdomen: soft, nontender, nondistended. No hepatosplenomegaly. No bruits or masses. Good bowel sounds. Extremities: no cyanosis, clubbing, rash, 2+ edema Neuro: alert & orientedx3, cranial nerves grossly intact. moves all 4 extremities w/o difficulty. Affect pleasant  Agree with slow wean of dobutamine. Volume status still appears elevated. Would continue IV diuresis. Await cMRI results. Will likely plan coronary angio tomorrow or Wednesday.   Long d/w patient and his family and family friend, Dr. Leland Her (cardiac anesthesiology in [place]).  Total time spent 45 minutes. Over half that time spent discussing above.   Glori Bickers, MD  7:08 PM

## 2020-11-20 NOTE — Progress Notes (Signed)
  Mobility Specialist Criteria Algorithm Info.  SATURATION QUALIFICATIONS: (This note is used to comply with regulatory documentation for home oxygen)  Patient Saturations on Room Air at Rest = 90%  Patient Saturations on Room Air while Ambulating = 89%  Patient Saturations on 0 Liters of oxygen while Ambulating = n/a%  Please briefly explain why patient needs home oxygen:  Mobility Team:  HOB elevated: Activity: Ambulated in hall; Dangled on edge of bed Range of motion: Active; All extremities Level of assistance: Modified independent, requires aide device or extra time Assistive device: None Minutes sitting in chair:  Minutes stood: 5 minutes Minutes ambulated: 5 minutes Distance ambulated (ft): 420 ft Mobility response: Tolerated well Bed Position: Semi-fowlers  Patient agreed to participate in mobility this morning, reported being less ambulatory due to having more lines added to IV pole. Stood and ambulated in hallway 420 feet mod I with steady gait. Required standing rest break x1, oxygen saturation lingering 89-91%. Denied dizziness/lightheadedness. Completed education on energy conservation and lip pursed breathing. Tolerated ambulation well without complaint or incident and is now dangling EOB with all needs met.   11/20/2020 11:07 AM

## 2020-11-20 NOTE — Progress Notes (Signed)
MD, pt may need something to help him sleep, pt did take something like Melatonin at home, please address, thanks Arvella Nigh RN.

## 2020-11-20 NOTE — Progress Notes (Signed)
VAD Coordinator note:  I followed up with the pt today per his request from seeing our team Friday. Pt states that he is still not ready to proceed with evaluation for LVAD. I explained to the pt that the evaluation does not mean that he will receive a heart pump but just gives [place] the opportunity to gather the data. The pt states that he would like to see the results of his cardiac MRI before proceeding with evaluation or before meeting a VAD pt. The pt would like for me to f/u with him tomorrow. I have informed Dr. Haroldine Laws of this encounter.  Tanda Rockers RN, BSN VAD Coordinator 24/7 Pager [phone]

## 2020-11-20 NOTE — Progress Notes (Signed)
   11/20/20 2009  Assess: MEWS Score  Level of Consciousness Alert  Assess: MEWS Score  MEWS Temp 0  MEWS Systolic 0  MEWS Pulse 2  MEWS RR 0  MEWS LOC 0  MEWS Score 2  MEWS Score Color Yellow  Assess: if the MEWS score is Yellow or Red  Were vital signs taken at a resting state? Yes  Focused Assessment No change from prior assessment  Early Detection of Sepsis Score *See Row Information* Low  MEWS guidelines implemented *See Row Information* No, previously yellow, continue vital signs every 4 hours  Treat  Pain Scale 0-10  Pain Score 0  Escalate  MEWS: Escalate Yellow: discuss with charge nurse/RN and consider discussing with provider and RRT  Notify: Charge Nurse/RN  Name of Charge Nurse/RN Notified [place] RN  Date Charge Nurse/RN Notified 11/20/20  Time Charge Nurse/RN Notified 2015  Document  Patient Outcome Other (Comment) (Stable; Remains on unit)  Progress note created (see row info) Yes

## 2020-11-20 NOTE — Plan of Care (Signed)
  Problem: Clinical Measurements: Goal: Will remain free from infection Outcome: Completed/Met   Problem: Activity: Goal: Risk for activity intolerance will decrease Outcome: Completed/Met   Problem: Nutrition: Goal: Adequate nutrition will be maintained Outcome: Completed/Met   Problem: Skin Integrity: Goal: Risk for impaired skin integrity will decrease Outcome: Completed/Met   Problem: Safety: Goal: Ability to remain free from injury will improve Outcome: Completed/Met   Problem: Pain Managment: Goal: General experience of comfort will improve Outcome: Completed/Met   Problem: Elimination: Goal: Will not experience complications related to urinary retention Outcome: Completed/Met   Problem: Elimination: Goal: Will not experience complications related to bowel motility Outcome: Completed/Met

## 2020-11-21 ENCOUNTER — Encounter (HOSPITAL_COMMUNITY)
Admission: AD | Disposition: A | Discharge status: Home Health | Payer: Self-pay | Source: Ambulatory Visit | Attending: Internal Medicine

## 2020-11-21 DIAGNOSIS — I2510 Atherosclerotic heart disease of native coronary artery without angina pectoris: Secondary | ICD-10-CM | POA: Diagnosis not present

## 2020-11-21 DIAGNOSIS — I255 Ischemic cardiomyopathy: Secondary | ICD-10-CM | POA: Diagnosis not present

## 2020-11-21 HISTORY — PX: LEFT HEART CATH AND CORONARY ANGIOGRAPHY: CATH118249

## 2020-11-21 LAB — CBC
HCT: 26.9 % — ABNORMAL LOW (ref 39.0–52.0)
Hemoglobin: 8.5 g/dL — ABNORMAL LOW (ref 13.0–17.0)
MCH: 25.8 pg — ABNORMAL LOW (ref 26.0–34.0)
MCHC: 31.6 g/dL (ref 30.0–36.0)
MCV: 81.5 fL (ref 80.0–100.0)
Platelets: 228 10*3/uL (ref 150–400)
RBC: 3.3 MIL/uL — ABNORMAL LOW (ref 4.22–5.81)
RDW: 18.5 % — ABNORMAL HIGH (ref 11.5–15.5)
WBC: 7.4 10*3/uL (ref 4.0–10.5)
nRBC: 0 % (ref 0.0–0.2)

## 2020-11-21 LAB — GLUCOSE, CAPILLARY
Glucose-Capillary: 134 mg/dL — ABNORMAL HIGH (ref 70–99)
Glucose-Capillary: 165 mg/dL — ABNORMAL HIGH (ref 70–99)
Glucose-Capillary: 227 mg/dL — ABNORMAL HIGH (ref 70–99)
Glucose-Capillary: 191 mg/dL — ABNORMAL HIGH (ref 70–99)

## 2020-11-21 LAB — BASIC METABOLIC PANEL
Anion gap: 10 (ref 5–15)
BUN: 28 mg/dL — ABNORMAL HIGH (ref 8–23)
CO2: 31 mmol/L (ref 22–32)
Calcium: 9.3 mg/dL (ref 8.9–10.3)
Chloride: 95 mmol/L — ABNORMAL LOW (ref 98–111)
Creatinine, Ser: 1.66 mg/dL — ABNORMAL HIGH (ref 0.61–1.24)
GFR, Estimated: 44 mL/min — ABNORMAL LOW (ref >60)
Glucose, Bld: 147 mg/dL — ABNORMAL HIGH (ref 70–99)
Potassium: 3.4 mmol/L — ABNORMAL LOW (ref 3.5–5.1)
Sodium: 136 mmol/L (ref 135–145)

## 2020-11-21 LAB — COOXEMETRY PANEL
Carboxyhemoglobin: 1.8 % — ABNORMAL HIGH (ref 0.5–1.5)
Methemoglobin: 0.9 % (ref 0.0–1.5)
O2 Saturation: 63.7 %
Total hemoglobin: 8.6 g/dL — ABNORMAL LOW (ref 12.0–16.0)

## 2020-11-21 SURGERY — LEFT HEART CATH AND CORONARY ANGIOGRAPHY
Anesthesia: LOCAL

## 2020-11-21 MED ORDER — POTASSIUM CHLORIDE CRYS ER 20 MEQ PO TBCR
40.0000 meq | EXTENDED_RELEASE_TABLET | Freq: Every day | ORAL | Status: DC
  Administered 2020-11-21 – 2020-11-22 (×2): 40 meq via ORAL
  Filled 2020-11-21 (×2): qty 2

## 2020-11-21 MED ORDER — MIDAZOLAM HCL 2 MG/2ML IJ SOLN
INTRAMUSCULAR | Status: AC
  Filled 2020-11-21: qty 2

## 2020-11-21 MED ORDER — FENTANYL CITRATE (PF) 100 MCG/2ML IJ SOLN
INTRAMUSCULAR | Status: DC | PRN
  Administered 2020-11-21 (×3): 25 ug via INTRAVENOUS

## 2020-11-21 MED ORDER — SODIUM CHLORIDE 0.9% FLUSH: 3.0000 mL | INTRAVENOUS | Status: DC | PRN

## 2020-11-21 MED ORDER — VERAPAMIL HCL 2.5 MG/ML IV SOLN
INTRAVENOUS | Status: AC
  Filled 2020-11-21: qty 2

## 2020-11-21 MED ORDER — HYDRALAZINE HCL 20 MG/ML IJ SOLN: 10.0000 mg | INTRAMUSCULAR | Status: AC | PRN

## 2020-11-21 MED ORDER — ONDANSETRON HCL 4 MG/2ML IJ SOLN: 4.0000 mg | Freq: Four times a day (QID) | INTRAMUSCULAR | Status: DC | PRN

## 2020-11-21 MED ORDER — HEPARIN SODIUM (PORCINE) 1000 UNIT/ML IJ SOLN
INTRAMUSCULAR | Status: AC
  Filled 2020-11-21: qty 1

## 2020-11-21 MED ORDER — SODIUM CHLORIDE 0.9 % IV SOLN: INTRAVENOUS | Status: DC

## 2020-11-21 MED ORDER — SODIUM CHLORIDE 0.9 % IV SOLN: INTRAVENOUS | Status: AC

## 2020-11-21 MED ORDER — IOHEXOL 350 MG/ML SOLN
INTRAVENOUS | Status: DC | PRN
  Administered 2020-11-21: 70 mL

## 2020-11-21 MED ORDER — ENOXAPARIN SODIUM 40 MG/0.4ML ~~LOC~~ SOLN
40.0000 mg | SUBCUTANEOUS | Status: DC
  Administered 2020-11-22 – 2020-11-23 (×2): 40 mg via SUBCUTANEOUS
  Filled 2020-11-21 (×3): qty 0.4

## 2020-11-21 MED ORDER — HEPARIN (PORCINE) IN NACL 1000-0.9 UT/500ML-% IV SOLN
INTRAVENOUS | Status: AC
  Filled 2020-11-21: qty 1000

## 2020-11-21 MED ORDER — SODIUM CHLORIDE 0.9 % IV SOLN: 250.0000 mL | INTRAVENOUS | Status: DC | PRN

## 2020-11-21 MED ORDER — HEPARIN SODIUM (PORCINE) 1000 UNIT/ML IJ SOLN
INTRAMUSCULAR | Status: DC | PRN
  Administered 2020-11-21: 4000 [IU] via INTRAVENOUS

## 2020-11-21 MED ORDER — ASPIRIN 81 MG PO CHEW
81.0000 mg | CHEWABLE_TABLET | ORAL | Status: AC
  Administered 2020-11-21: 81 mg via ORAL
  Filled 2020-11-21: qty 1

## 2020-11-21 MED ORDER — LIDOCAINE HCL (PF) 1 % IJ SOLN
INTRAMUSCULAR | Status: DC | PRN
Start: 2020-11-21 — End: 2020-11-21
  Administered 2020-11-21: 2 mL

## 2020-11-21 MED ORDER — ASPIRIN EC 81 MG PO TBEC
81.0000 mg | DELAYED_RELEASE_TABLET | Freq: Every day | ORAL | Status: DC
  Administered 2020-11-22 – 2020-12-05 (×14): 81 mg via ORAL
  Filled 2020-11-21 (×15): qty 1

## 2020-11-21 MED ORDER — FENTANYL CITRATE (PF) 100 MCG/2ML IJ SOLN
INTRAMUSCULAR | Status: AC
  Filled 2020-11-21: qty 2

## 2020-11-21 MED ORDER — SODIUM CHLORIDE 0.9% FLUSH
3.0000 mL | Freq: Two times a day (BID) | INTRAVENOUS | Status: DC
  Administered 2020-11-22: 3 mL via INTRAVENOUS

## 2020-11-21 MED ORDER — HEPARIN (PORCINE) IN NACL 1000-0.9 UT/500ML-% IV SOLN
INTRAVENOUS | Status: AC
  Filled 2020-11-21: qty 500

## 2020-11-21 MED ORDER — MIDAZOLAM HCL 2 MG/2ML IJ SOLN
INTRAMUSCULAR | Status: DC | PRN
Start: 2020-11-21 — End: 2020-11-21
  Administered 2020-11-21 (×3): 0.5 mg via INTRAVENOUS

## 2020-11-21 MED ORDER — HEPARIN (PORCINE) IN NACL 1000-0.9 UT/500ML-% IV SOLN
INTRAVENOUS | Status: DC | PRN
  Administered 2020-11-21 (×3): 500 mL

## 2020-11-21 MED ORDER — VERAPAMIL HCL 2.5 MG/ML IV SOLN
INTRAVENOUS | Status: DC | PRN
  Administered 2020-11-21: 10 mL via INTRA_ARTERIAL

## 2020-11-21 MED ORDER — LABETALOL HCL 5 MG/ML IV SOLN: 10.0000 mg | INTRAVENOUS | Status: AC | PRN

## 2020-11-21 MED ORDER — ACETAMINOPHEN 325 MG PO TABS: 650.0000 mg | ORAL_TABLET | ORAL | Status: DC | PRN

## 2020-11-21 MED ORDER — LIDOCAINE HCL (PF) 1 % IJ SOLN
INTRAMUSCULAR | Status: AC
  Filled 2020-11-21: qty 30

## 2020-11-21 SURGICAL SUPPLY — 11 items
CATH 5FR JL3.5 JR4 ANG PIG MP (CATHETERS) ×1 IMPLANT
DEVICE RAD COMP TR BAND LRG (VASCULAR PRODUCTS) ×1 IMPLANT
DEVICE RAD TR BAND REGULAR (VASCULAR PRODUCTS) IMPLANT
GLIDESHEATH SLEND A-KIT 6F 22G (SHEATH) ×2 IMPLANT
GUIDEWIRE INQWIRE 1.5J.035X260 (WIRE) IMPLANT
INQWIRE 1.5J .035X260CM (WIRE) ×2
KIT HEART LEFT (KITS) ×2 IMPLANT
PACK CARDIAC CATHETERIZATION (CUSTOM PROCEDURE TRAY) ×2 IMPLANT
SHEATH PROBE COVER 6X72 (BAG) ×1 IMPLANT
TRANSDUCER W/STOPCOCK (MISCELLANEOUS) ×2 IMPLANT
TUBING CIL FLEX 10 FLL-RA (TUBING) ×2 IMPLANT

## 2020-11-21 NOTE — Progress Notes (Signed)
Patient's site has no signs of hematoma surrounding the site. There is no drainage at the TR band. TR band removal successful. Site is dressed with guaze. Site is clean dry and intact.

## 2020-11-21 NOTE — Progress Notes (Signed)
Inpatient Diabetes Program Recommendations  AACE/ADA: New Consensus Statement on Inpatient Glycemic Control   Target Ranges:  Prepandial:   less than 140 mg/dL      Peak postprandial:   less than 180 mg/dL (1-2 hours)      Critically ill patients:  140 - 180 mg/dL   Results for Marc [name], Marc [name] "[name]" (MRN [id number]) as of 11/21/2020 10:07  Ref. Range 11/20/2020 05:49 11/20/2020 11:32 11/20/2020 16:23 11/20/2020 22:38 11/21/2020 06:12  Glucose-Capillary Latest Ref Range: 70 - 99 mg/dL 133 (H) 164 (H) 196 (H) 266 (H) 134 (H)   Review of Glycemic Control  Diabetes history: DM2 Outpatient Diabetes medications:Amaryl 4 mg daily, Metformin 1000 mg BID Current orders for Inpatient glycemic control: Novolog 0-15 units AC&HS  Inpatient Diabetes Program Recommendations:    Insulin: Once diet resumed, please consider ordering Novolog 3 units TID with meals for meal coverage if patient eats at least 50% of meals.  Thanks, Barnie Alderman, RN, MSN, CDE Diabetes Coordinator Inpatient Diabetes Program [phone] (Team Pager from 8am to 5pm)

## 2020-11-21 NOTE — Progress Notes (Addendum)
Patient ID: Marc [name], male   DOB: [birth year], 70 y.o.   MRN: [id number]    Advanced Heart Failure Rounding Note   Subjective:    Dobutamine 2.5 started 3/31 for low output.  Had low CO-OX Dobtuamine increased to 4 mcg.   DBA decreased to 3 yesterday. Now on po torsemide. Co-ox 64%  Denies, CP, SOB, orthopnea or PND.  Weight down 4 pounds overnight. (15 pounds total). Creatinine 1.48 -> 1.66   cMRI 11/20/20 1. Mildly dilated LV with EF 22%, wall motion abnormalities as noted above. 2.  Normal RV size with mildly decreased systolic function, EF [id number]. 3. Suspect severe mitral regurgitation (flow sequences to confirm were not done. 4. Delayed enhancement imaging shows extensive LGE in a coronary pattern. The peri-apical segments do not appear viable.    Objective:   Weight Range:  Vital Signs:   Temp:  [97.8 F (36.6 C)-98.1 F (36.7 C)] 98.1 F (36.7 C) (04/05 0400) Pulse Rate:  [109-117] 117 (04/05 0400) Resp:  [16-19] 19 (04/05 0400) BP: (105-114)/(67-77) 114/77 (04/05 0400) SpO2:  [97 %-100 %] 100 % (04/05 0400) Weight:  [74.1 kg] 74.1 kg (04/05 0116) Last BM Date: 11/20/20  Weight change: Filed Weights   11/20/20 0041 11/20/20 0500 11/21/20 0116  Weight: 75.9 kg 75.9 kg 74.1 kg    Intake/Output:   Intake/Output Summary (Last 24 hours) at 11/21/2020 1021 Last data filed at 11/21/2020 0837 Gross per 24 hour  Intake 960 ml  Output 3650 ml  Net -2690 ml    General:  Sitting up in chair. No resp difficulty HEENT: normal Neck: supple. no JVD. Carotids 2+ bilat; no bruits. No lymphadenopathy or thryomegaly appreciated. Cor: PMI nondisplaced. Tachy egular rate & rhythm. No rubs, gallops or murmurs. Lungs: clear Abdomen: soft, nontender, nondistended. No hepatosplenomegaly. No bruits or masses. Good bowel sounds. Extremities: no cyanosis, clubbing, rash, 1+ edema + UNNA Neuro: alert & orientedx3, cranial nerves grossly intact. moves all 4 extremities w/o  difficulty. Affect pleasant   Telemetry: ST 110-120s  with occasional PVCs.   Labs: Basic Metabolic Panel: Recent Labs  Lab 11/17/20 0648 11/18/20 1111 11/19/20 0500 11/20/20 0540 11/20/20 1121 11/21/20 0402  NA 133* 132* 135 138 135 136  K 3.8 4.3 3.8 3.8 4.1 3.4*  CL 99 98 100 100 98 95*  CO2 '25 24 27 29 29 31  '$ GLUCOSE 119* 350* 168* 128* 219* 147*  BUN 39* 32* 29* 28* 24* 28*  CREATININE 1.66* 1.52* 1.52* 1.57* 1.48* 1.66*  CALCIUM 8.6* 8.7* 8.8* 9.0 9.2 9.3  MG 2.2  --  2.0  --   --   --     Liver Function Tests: No results for input(s): AST, ALT, ALKPHOS, BILITOT, PROT, ALBUMIN in the last 168 hours. No results for input(s): LIPASE, AMYLASE in the last 168 hours. No results for input(s): AMMONIA in the last 168 hours.  CBC: Recent Labs  Lab 11/15/20 0535 11/16/20 1046 11/17/20 0648 11/18/20 1111 11/19/20 0500 11/20/20 0540 11/21/20 0402  WBC 5.6   < > 5.3 7.0 7.2 8.0 7.4  NEUTROABS 4.4  --   --   --   --   --   --   HGB 7.7*   < > 7.0* 7.7* 7.2* 8.3* 8.5*  HCT 24.3*   < > 22.6* 25.5* 23.6* 26.4* 26.9*  MCV 78.6*   < > 77.9* 79.9* 80.5 80.7 81.5  PLT 197   < > 213 220 224 211 228   < > =  values in this interval not displayed.    Cardiac Enzymes: No results for input(s): CKTOTAL, CKMB, CKMBINDEX, TROPONINI in the last 168 hours.  BNP: BNP (last 3 results) Recent Labs    11/11/20 2158  BNP 1,728.5*    ProBNP (last 3 results) Recent Labs    10/24/20 1119  PROBNP 1,685*      Other results:  Imaging: MR CARDIAC MORPHOLOGY W WO CONTRAST  Result Date: 11/20/2020 EXAM: CARDIAC MRI TECHNIQUE: The patient was scanned on a 1.5 Tesla GE magnet. A dedicated cardiac coil was used. Functional imaging was done using Fiesta sequences. 2,3, and 4 chamber views were done to assess for RWMA's. Modified Simpson's rule using a short axis stack was used to calculate an ejection fraction on a dedicated work Conservation officer, nature. The patient received 8 cc  of Gadavist. After 10 minutes inversion recovery sequences were used to assess for infiltration and scar tissue. CONTRAST:  Gadavist 8 cc FINDINGS: Small left, moderate right pleural effusion. Bilateral patchy airspace disease noted. Mildly dilated left ventricle with normal wall thickness. Akinesis of the mid to apical inferoseptal, anteroseptal and anterior walls, the true apex, the apical inferior wall. Severe hypokinesis of the inferolateral wall and the mid inferior wall. No LV thrombus visualized. Overall LV EF 22%. Normal right ventricular size with EF 38% (mildly decreased). Mildly dilated right atrium, moderately dilated left atrium. Mitral regurgitation appears severe (flow sequences to confirm were not done). Mild tricuspid regurgitation. Trileaflet aortic valve with no regurgitation or stenosis. Delayed enhancement imaging: Near full thickness subendocardial late gadolinium enhancement (LGE) at the LV apex. >75% wall thickness subendocardial LGE apical inferolateral wall. >75% wall thickness subendocardial LGE apical septal wall. >75% wall thickness subendocardial LGE apical inferior wall. >75% wall thickness subendocardial LGE apical anterior wall. <50% wall thickness subnedocardial LGE mid anterior wall, mid anteroseptal wall, and mid inferoseptal wall. Measurements: LVEDV 246 mL LVSV 53 mL LVEF 22% RVEDV 148 mL RVSV 57 mL RVEF 38% IMPRESSION: 1. Mildly dilated LV with EF 22%, wall motion abnormalities as noted above. 2.  Normal RV size with mildly decreased systolic function, EF [id number]. 3. Suspect severe mitral regurgitation (flow sequences to confirm were not done. 4. Delayed enhancement imaging shows extensive LGE in a coronary pattern. The peri-apical segments do not appear viable. Dalton Mclean Electronically Signed   By: Loralie Champagne M.D.   On: 11/20/2020 22:12     Medications:     Scheduled Medications: . sodium chloride   Intravenous Once  . atorvastatin  40 mg Oral Daily  . calcium  carbonate  1 tablet Oral BID WC  . Chlorhexidine Gluconate Cloth  6 each Topical Daily  . enoxaparin (LOVENOX) injection  40 mg Subcutaneous Q24H  . ezetimibe  10 mg Oral Daily  . fluticasone  2 spray Each Nare BID  . insulin aspart  0-15 Units Subcutaneous TID AC & HS  . multivitamin with minerals  1 tablet Oral Daily  . omega-3 acid ethyl esters  1 g Oral Daily  . pantoprazole  40 mg Oral Daily  . polyethylene glycol  17 g Oral Daily  . potassium chloride  40 mEq Oral Daily  . sodium chloride flush  10-40 mL Intracatheter Q12H  . sodium chloride flush  3 mL Intravenous Q12H  . sodium chloride flush  3 mL Intravenous Q12H  . torsemide  40 mg Oral BID    Infusions: . sodium chloride    . sodium chloride    .  sodium chloride    . DOBUTamine 3 mcg/kg/min (11/20/20 1546)    PRN Medications: sodium chloride, sodium chloride, acetaminophen, calcium carbonate, guaiFENesin-dextromethorphan, ondansetron (ZOFRAN) IV, sodium chloride flush, sodium chloride flush, sodium chloride flush   Assessment/Plan:   1. A/C Systolic Heart Failure,  ICM  - ECHO 2019 35-40%  - ECHO EF 20-25% RV normal Mod -Severe MR.  - RHC 11/16/20 - PCWP 25, RA 8, PA sat 46%  - Now on dobutamine 3, co-ox 64% - Volume status improved. CVP 5 - Still with mild LE edema. Unna boots in place - BB and ARNI held due to hypotension.  - Intolerant SGT2i with yeast infections.  - No dig yet with AKI =>  - cMRI 11/20/20 EF 22% with mild-mod RV dysfunction. Diffuse LGE suggestive of severe ischemic CM. Little viable myocardium - In looking at MRI, I suspect this is end-stage ischemic CM with low output. Will plan cath today but given limited viability on cMRI doubt revascularization will give him durable benefit. May get some benefit from MitraClip but I suspect most durable option remains VAD. And with CKD and RV dysfunction need to be careful not to miss his window. Await results of cath today  - Hold diuretics for now    2. Chronic Anemia  - EGD 2022 - normal. No recent colonoscopy - FOBT negative - Received feraheme this admit.  - SPEP negative -  EPO 50  -4/3 Given 1UPRBCs. Hgb 7.2>8.5  - On protonix.  - If VAD planned will need colonoscopy prior.   -3. CAD - Anterior MI 1997 PTCA LAD distal circ and 1st diagonal DES 2011 - Plan as above. Coronary angio today   4. AKI on CKD Stage IIIb - Creatinine baseline 1.6-1.8 , creatinine 1.66 today - Improving with DBA - Follow renal function daily   - hold diuretics today - limit dye on cath   5. Severe MR - this is functional.  LV not markedly dilated with LVEDD 5.8 cm, could consider Mitraclip but doubt this will be durable solution for him - D/w Dr. Burt Knack who will review   Length of Stay: 10   Glori Bickers MD  11/21/2020, 10:21 AM  Advanced Heart Failure Team Pager [phone] (M-F; [place])  Please contact [facility] for night-coverage after hours (4p -7a ) and weekends on [email or web address]

## 2020-11-21 NOTE — Plan of Care (Signed)
°  Problem: Education: °Goal: Ability to demonstrate management of disease process will improve °Outcome: Progressing °  °Problem: Education: °Goal: Ability to verbalize understanding of medication therapies will improve °Outcome: Progressing °  °Problem: Activity: °Goal: Capacity to carry out activities will improve °Outcome: Progressing °  °

## 2020-11-21 NOTE — CV Procedure (Signed)
   Severe three-vessel CAD.  High-grade proximal to mid first obtuse marginal, totally occluded LAD, multifocal proximal to mid native RCA disease.  Dominant RCA.  LAD is functionally occluded in the proximal to mid segment beyond the origin of a large first diagonal.  First diagonal contains 80% stenosis.  LVEDP 26 mmHg on IV dobutamine.  Heart rate 115 bpm during the case.

## 2020-11-21 NOTE — Progress Notes (Signed)
   Reviewed MRI.  If coronary angio today, I am available to perform.  Had discussion with patient.

## 2020-11-21 NOTE — Interval H&P Note (Signed)
Cath Lab Visit (complete for each Cath Lab visit)  Clinical Evaluation Leading to the Procedure:   ACS: Yes.    Non-ACS:    Anginal Classification: CCS [name]  Anti-ischemic medical therapy: Maximal Therapy (2 or more classes of medications)  Non-Invasive Test Results: No non-invasive testing performed  Prior CABG: No previous CABG      History and Physical Interval Note:  11/21/2020 10:32 AM  Marc [name]  has presented today for surgery, with the diagnosis of chest pain.  The various methods of treatment have been discussed with the patient and family. After consideration of risks, benefits and other options for treatment, the patient has consented to  Procedure(s): LEFT HEART CATH AND CORONARY ANGIOGRAPHY (N/A) as a surgical intervention.  The patient's history has been reviewed, patient examined, no change in status, stable for surgery.  I have reviewed the patient's chart and labs.  Questions were answered to the patient's satisfaction.     Marc [name]

## 2020-11-22 ENCOUNTER — Encounter (HOSPITAL_COMMUNITY): Payer: Self-pay | Admitting: Interventional Cardiology

## 2020-11-22 ENCOUNTER — Inpatient Hospital Stay (HOSPITAL_COMMUNITY): Payer: HMO

## 2020-11-22 DIAGNOSIS — I255 Ischemic cardiomyopathy: Secondary | ICD-10-CM | POA: Diagnosis not present

## 2020-11-22 LAB — CBC
HCT: 26.2 % — ABNORMAL LOW (ref 39.0–52.0)
Hemoglobin: 8.1 g/dL — ABNORMAL LOW (ref 13.0–17.0)
MCH: 25.1 pg — ABNORMAL LOW (ref 26.0–34.0)
MCHC: 30.9 g/dL (ref 30.0–36.0)
MCV: 81.1 fL (ref 80.0–100.0)
Platelets: 199 10*3/uL (ref 150–400)
RBC: 3.23 MIL/uL — ABNORMAL LOW (ref 4.22–5.81)
RDW: 18.6 % — ABNORMAL HIGH (ref 11.5–15.5)
WBC: 6.6 10*3/uL (ref 4.0–10.5)
nRBC: 0 % (ref 0.0–0.2)

## 2020-11-22 LAB — BASIC METABOLIC PANEL
Anion gap: 9 (ref 5–15)
Anion gap: 9 (ref 5–15)
BUN: 34 mg/dL — ABNORMAL HIGH (ref 8–23)
BUN: 38 mg/dL — ABNORMAL HIGH (ref 8–23)
CO2: 30 mmol/L (ref 22–32)
CO2: 30 mmol/L (ref 22–32)
Calcium: 8.7 mg/dL — ABNORMAL LOW (ref 8.9–10.3)
Calcium: 9.1 mg/dL (ref 8.9–10.3)
Chloride: 95 mmol/L — ABNORMAL LOW (ref 98–111)
Chloride: 93 mmol/L — ABNORMAL LOW (ref 98–111)
Creatinine, Ser: 1.95 mg/dL — ABNORMAL HIGH (ref 0.61–1.24)
Creatinine, Ser: 2.33 mg/dL — ABNORMAL HIGH (ref 0.61–1.24)
GFR, Estimated: 37 mL/min — ABNORMAL LOW (ref >60)
GFR, Estimated: 30 mL/min — ABNORMAL LOW (ref >60)
Glucose, Bld: 128 mg/dL — ABNORMAL HIGH (ref 70–99)
Glucose, Bld: 241 mg/dL — ABNORMAL HIGH (ref 70–99)
Potassium: 3.7 mmol/L (ref 3.5–5.1)
Potassium: 4.2 mmol/L (ref 3.5–5.1)
Sodium: 134 mmol/L — ABNORMAL LOW (ref 135–145)
Sodium: 132 mmol/L — ABNORMAL LOW (ref 135–145)

## 2020-11-22 LAB — COOXEMETRY PANEL
Carboxyhemoglobin: 2.1 % — ABNORMAL HIGH (ref 0.5–1.5)
Carboxyhemoglobin: 1.9 % — ABNORMAL HIGH (ref 0.5–1.5)
Methemoglobin: 0.9 % (ref 0.0–1.5)
Methemoglobin: 0.4 % (ref 0.0–1.5)
O2 Saturation: 83.5 %
O2 Saturation: 61.2 %
Total hemoglobin: 8.5 g/dL — ABNORMAL LOW (ref 12.0–16.0)
Total hemoglobin: 8.3 g/dL — ABNORMAL LOW (ref 12.0–16.0)

## 2020-11-22 LAB — GLUCOSE, CAPILLARY
Glucose-Capillary: 140 mg/dL — ABNORMAL HIGH (ref 70–99)
Glucose-Capillary: 241 mg/dL — ABNORMAL HIGH (ref 70–99)
Glucose-Capillary: 271 mg/dL — ABNORMAL HIGH (ref 70–99)
Glucose-Capillary: 192 mg/dL — ABNORMAL HIGH (ref 70–99)

## 2020-11-22 MED ORDER — POTASSIUM CHLORIDE CRYS ER 20 MEQ PO TBCR: 40.0000 meq | EXTENDED_RELEASE_TABLET | Freq: Once | ORAL | Status: DC

## 2020-11-22 MED ORDER — ZOLPIDEM TARTRATE 5 MG PO TABS
5.0000 mg | ORAL_TABLET | Freq: Once | ORAL | Status: DC
  Administered 2020-11-22: 5 mg via ORAL
  Filled 2020-11-22: qty 1

## 2020-11-22 NOTE — Plan of Care (Signed)
?  Problem: Education: ?Goal: Ability to demonstrate management of disease process will improve ?Outcome: Progressing ?  ?Problem: Education: ?Goal: Ability to verbalize understanding of medication therapies will improve ?Outcome: Progressing ?  ?Problem: Cardiac: ?Goal: Ability to achieve and maintain adequate cardiopulmonary perfusion will improve ?Outcome: Progressing ?  ?

## 2020-11-22 NOTE — Progress Notes (Signed)
Patient anxious about his situation and thinks he is having sleep apnea D/T his daughter making a comment to him while waking him up from a nap today. MD paged for something to help him sleep or relax.

## 2020-11-22 NOTE — Progress Notes (Signed)
VAD Coordinator met with patient and wife briefly this am. Ellen Henri, PA in room going over heart cath results from yesterday and patient options.  Pt states at this time he wants to try PCI option, will discuss later with Dr. Haroldine Laws.  Offered patient and wife my encouragement; asked them to call VAD Coordinator if they have any further needs from [place]. They both agreed to same.  Zada Girt RN, [place] Coordinator [phone]

## 2020-11-22 NOTE — Progress Notes (Addendum)
Patient ID: Marc [name], male   DOB: 10-27-1950, 70 y.o.   MRN: [id number]    Advanced Heart Failure Rounding Note   Subjective:    Dobutamine 2.5 started 3/31 for low output.  Had low CO-OX Dobtuamine increased to 4 mcg.   DBA decreased to 3. Co-ox 84% (repeat pending)  LHC yesterday showed multivessel CAD. There are targets for PCI, though cMRI showed limited viability.   Bump in SCr post cath, 1.66>>1.95.   Wt down 3 lb. CVP 8-9   He is chest pain free. We discussed VAD again, but he is not sure he wants it. He would prefer attempt at PCI.    LHC 11/21/20  Severe multivessel CAD  Known chronic total occlusion of the mid LAD.  Left to left collaterals are noted.  Proximal to mid LAD 90% before septal perforators.  Large branching first diagonal with a patent stent in the mid to distal segment beyond the proximal bifurcation.  Eccentric 80% stenosis within a bend in the proximal vessel just after the origin from the LAD.  A smaller branch contains 95% stenosis after the proximal bifurcation.  30% ostial left main  Diffuse disease in the proximal circumflex with up to 50% narrowing.  The dominant first obtuse marginal contains segmental 95% stenosis.  The mid first obtuse marginal contains a patent stent.  The continuation of the circumflex gives origin to 2 small obtuse marginal branches.  Right coronary is dominant.  First images were taken on a 10 inch magnification.  There are tandem 85% followed by 75% proximal to mid LAD stenoses.  Distally at the bifurcation with the PDA there is 75% stenosis.  The ostium of the PDA contains 85% stenosis.  The PDA has slight left to right filling by collaterals.  LVEDP 25 mmHg on IV dobutamine.  RECOMMENDATIONS:   There are targets for PCI.  If PCI chosen this would be a high risk procedure and likely would require hemodynamic support.  Targets are the circumflex obtuse marginal, proximal diagonal #1, and proximal to mid RCA.  The  distal RCA bifurcation would be more tricky.  Significant mitral regurgitation.  Possible MitraClip target if appropriate.  Need to definitively determine whether surgical revascularization is possible.  Advanced therapies for stage IV CHF will be required to hopefully better stabilize the patient if PCI is to be contemplated.    cMRI 11/20/20 1. Mildly dilated LV with EF 22%, wall motion abnormalities as noted above. 2.  Normal RV size with mildly decreased systolic function, EF [id number]. 3. Suspect severe mitral regurgitation (flow sequences to confirm were not done. 4. Delayed enhancement imaging shows extensive LGE in a coronary pattern. The peri-apical segments do not appear viable.    Objective:   Weight Range:  Vital Signs:   Temp:  [98 F (36.7 C)-98.6 F (37 C)] 98.4 F (36.9 C) (04/06 0725) Pulse Rate:  [107-123] 114 (04/06 0920) Resp:  [12-21] 20 (04/06 0920) BP: (94-109)/(57-77) 108/68 (04/06 0920) SpO2:  [96 %-100 %] 97 % (04/06 0920) Weight:  [72.8 kg] 72.8 kg (04/06 0003) Last BM Date: 11/22/20  Weight change: Filed Weights   11/20/20 0500 11/21/20 0116 11/22/20 0003  Weight: 75.9 kg 74.1 kg 72.8 kg    Intake/Output:   Intake/Output Summary (Last 24 hours) at 11/22/2020 1014 Last data filed at 11/22/2020 0920 Gross per 24 hour  Intake 1157.3 ml  Output 2305 ml  Net -1147.7 ml    CVP 8-9  General:  Thin but  well appearing, No resp difficulty HEENT: normal Neck: supple.  JVD 9 cm. Carotids 2+ bilat; no bruits. No lymphadenopathy or thryomegaly appreciated. Cor: PMI nondisplaced. Reg rhythm, tachy rate. No rubs, gallops or murmurs. Lungs: clear Abdomen: soft, nontender, nondistended. No hepatosplenomegaly. No bruits or masses. Good bowel sounds. Extremities: no cyanosis, clubbing, rash, 1+ bilateral LE edema + UNNA boots Neuro: alert & orientedx3, cranial nerves grossly intact. moves all 4 extremities w/o difficulty. Affect pleasant   Telemetry: sinus  tach 110s, personally reviewed    Labs: Basic Metabolic Panel: Recent Labs  Lab 11/17/20 0648 11/18/20 1111 11/19/20 0500 11/20/20 0540 11/20/20 1121 11/21/20 0402 11/22/20 0519  NA 133*   < > 135 138 135 136 134*  K 3.8   < > 3.8 3.8 4.1 3.4* 3.7  CL 99   < > 100 100 98 95* 95*  CO2 25   < > '27 29 29 31 30  '$ GLUCOSE 119*   < > 168* 128* 219* 147* 128*  BUN 39*   < > 29* 28* 24* 28* 34*  CREATININE 1.66*   < > 1.52* 1.57* 1.48* 1.66* 1.95*  CALCIUM 8.6*   < > 8.8* 9.0 9.2 9.3 8.7*  MG 2.2  --  2.0  --   --   --   --    < > = values in this interval not displayed.    Liver Function Tests: No results for input(s): AST, ALT, ALKPHOS, BILITOT, PROT, ALBUMIN in the last 168 hours. No results for input(s): LIPASE, AMYLASE in the last 168 hours. No results for input(s): AMMONIA in the last 168 hours.  CBC: Recent Labs  Lab 11/18/20 1111 11/19/20 0500 11/20/20 0540 11/21/20 0402 11/22/20 0519  WBC 7.0 7.2 8.0 7.4 6.6  HGB 7.7* 7.2* 8.3* 8.5* 8.1*  HCT 25.5* 23.6* 26.4* 26.9* 26.2*  MCV 79.9* 80.5 80.7 81.5 81.1  PLT 220 224 211 228 199    Cardiac Enzymes: No results for input(s): CKTOTAL, CKMB, CKMBINDEX, TROPONINI in the last 168 hours.  BNP: BNP (last 3 results) Recent Labs    11/11/20 2158  BNP 1,728.5*    ProBNP (last 3 results) Recent Labs    10/24/20 1119  PROBNP 1,685*      Other results:  Imaging: CARDIAC CATHETERIZATION  Result Date: 11/21/2020  Severe multivessel CAD  Known chronic total occlusion of the mid LAD.  Left to left collaterals are noted.  Proximal to mid LAD 90% before septal perforators.  Large branching first diagonal with a patent stent in the mid to distal segment beyond the proximal bifurcation.  Eccentric 80% stenosis within a bend in the proximal vessel just after the origin from the LAD.  A smaller branch contains 95% stenosis after the proximal bifurcation.  30% ostial left main  Diffuse disease in the proximal circumflex  with up to 50% narrowing.  The dominant first obtuse marginal contains segmental 95% stenosis.  The mid first obtuse marginal contains a patent stent.  The continuation of the circumflex gives origin to 2 small obtuse marginal branches.  Right coronary is dominant.  First images were taken on a 10 inch magnification.  There are tandem 85% followed by 75% proximal to mid LAD stenoses.  Distally at the bifurcation with the PDA there is 75% stenosis.  The ostium of the PDA contains 85% stenosis.  The PDA has slight left to right filling by collaterals.  LVEDP 25 mmHg on IV dobutamine. RECOMMENDATIONS:  There are targets for PCI.  If PCI chosen this would be a high risk procedure and likely would require hemodynamic support.  Targets are the circumflex obtuse marginal, proximal diagonal #1, and proximal to mid RCA.  The distal RCA bifurcation would be more tricky.  Significant mitral regurgitation.  Possible MitraClip target if appropriate.  Need to definitively determine whether surgical revascularization is possible.  Advanced therapies for stage IV CHF will be required to hopefully better stabilize the patient if PCI is to be contemplated.  MR CARDIAC MORPHOLOGY W WO CONTRAST  Result Date: 11/20/2020 EXAM: CARDIAC MRI TECHNIQUE: The patient was scanned on a 1.5 Tesla GE magnet. A dedicated cardiac coil was used. Functional imaging was done using Fiesta sequences. 2,3, and 4 chamber views were done to assess for RWMA's. Modified Simpson's rule using a short axis stack was used to calculate an ejection fraction on a dedicated work Conservation officer, nature. The patient received 8 cc of Gadavist. After 10 minutes inversion recovery sequences were used to assess for infiltration and scar tissue. CONTRAST:  Gadavist 8 cc FINDINGS: Small left, moderate right pleural effusion. Bilateral patchy airspace disease noted. Mildly dilated left ventricle with normal wall thickness. Akinesis of the mid to apical  inferoseptal, anteroseptal and anterior walls, the true apex, the apical inferior wall. Severe hypokinesis of the inferolateral wall and the mid inferior wall. No LV thrombus visualized. Overall LV EF 22%. Normal right ventricular size with EF 38% (mildly decreased). Mildly dilated right atrium, moderately dilated left atrium. Mitral regurgitation appears severe (flow sequences to confirm were not done). Mild tricuspid regurgitation. Trileaflet aortic valve with no regurgitation or stenosis. Delayed enhancement imaging: Near full thickness subendocardial late gadolinium enhancement (LGE) at the LV apex. >75% wall thickness subendocardial LGE apical inferolateral wall. >75% wall thickness subendocardial LGE apical septal wall. >75% wall thickness subendocardial LGE apical inferior wall. >75% wall thickness subendocardial LGE apical anterior wall. <50% wall thickness subnedocardial LGE mid anterior wall, mid anteroseptal wall, and mid inferoseptal wall. Measurements: LVEDV 246 mL LVSV 53 mL LVEF 22% RVEDV 148 mL RVSV 57 mL RVEF 38% IMPRESSION: 1. Mildly dilated LV with EF 22%, wall motion abnormalities as noted above. 2.  Normal RV size with mildly decreased systolic function, EF [id number]. 3. Suspect severe mitral regurgitation (flow sequences to confirm were not done. 4. Delayed enhancement imaging shows extensive LGE in a coronary pattern. The peri-apical segments do not appear viable. Dalton Mclean Electronically Signed   By: Loralie Champagne M.D.   On: 11/20/2020 22:12     Medications:     Scheduled Medications: . sodium chloride   Intravenous Once  . aspirin EC  81 mg Oral Daily  . atorvastatin  40 mg Oral Daily  . calcium carbonate  1 tablet Oral BID WC  . Chlorhexidine Gluconate Cloth  6 each Topical Daily  . enoxaparin (LOVENOX) injection  40 mg Subcutaneous Q24H  . ezetimibe  10 mg Oral Daily  . fluticasone  2 spray Each Nare BID  . insulin aspart  0-15 Units Subcutaneous TID AC & HS  . multivitamin  with minerals  1 tablet Oral Daily  . omega-3 acid ethyl esters  1 g Oral Daily  . pantoprazole  40 mg Oral Daily  . polyethylene glycol  17 g Oral Daily  . potassium chloride  40 mEq Oral Daily  . sodium chloride flush  10-40 mL Intracatheter Q12H  . sodium chloride flush  3 mL Intravenous Q12H  . sodium chloride flush  3 mL Intravenous  Q12H  . sodium chloride flush  3 mL Intravenous Q12H  . torsemide  40 mg Oral BID    Infusions: . sodium chloride    . sodium chloride    . sodium chloride    . DOBUTamine 3 mcg/kg/min (11/22/20 0639)    PRN Medications: sodium chloride, sodium chloride, sodium chloride, acetaminophen, calcium carbonate, guaiFENesin-dextromethorphan, ondansetron (ZOFRAN) IV, sodium chloride flush, sodium chloride flush, sodium chloride flush, sodium chloride flush   Assessment/Plan:   1. A/C Systolic Heart Failure,  ICM  - ECHO 2019 35-40%  - ECHO EF 20-25% RV normal Mod -Severe MR.  - RHC 11/16/20 - PCWP 25, RA 8, PA sat 46%  - Now on dobutamine 3, co-ox 84%. Repeat  - CVP 9. Continue torsemide 40 mg bid - Still with mild LE edema. Unna boots in place - BB and ARNI held due to hypotension.  - Intolerant SGT2i with yeast infections.  - No dig yet with AKI  - cMRI 11/20/20 EF 22% with mild-mod RV dysfunction. Diffuse LGE suggestive of severe ischemic CM. Little viable myocardium - In looking at MRI, I suspect this is end-stage ischemic CM with low output. Cath w/ severe MVCAD but given limited viability on cMRI doubt revascularization will give him durable benefit. May get some benefit from MitraClip but I suspect most durable option remains VAD. And with CKD and RV dysfunction need to be careful not to miss his window.    2. Chronic Anemia  - EGD 2022 - normal. No recent colonoscopy - FOBT negative - Received feraheme this admit.  - SPEP negative -  EPO 50  - 4/3 Given 1UPRBCs. Hgb 7.2>8.5>8.1 - On protonix.  - If VAD planned will need colonoscopy prior.    3. CAD - Anterior MI 1997 PTCA LAD distal circ and 1st diagonal DES 2011 - LHC this admit w/ severe MVCAD  - There are targets for PCI.  However if PCI is chosen this would be a high risk procedure and likely would require hemodynamic support. Also w/ cMRI showing limited viability, doubt there would be much benefit to revascularization. Will need to d/w interventional team and CT surgery. VAD may be only viable option.   - continue ASA + statin + Zetia    4. AKI on CKD Stage IIIb - Creatinine baseline 1.6-1.8 , creatinine 1.66>>1.95 Post cath - continue DBA - Follow renal function daily. Repeat BMP this afternoon   - may need to hold evening dose of torsemide if SCr higher     5. Severe MR - this is functional.  LV not markedly dilated with LVEDD 5.8 cm, could consider Mitraclip but doubt this will be durable solution for him - D/w Dr. Burt Knack who will review   Length of Stay: [place] PA-C  11/22/2020, 10:14 AM  Advanced Heart Failure Team Pager [phone] (M-F; 7a - 4p)  Please contact [facility] for night-coverage after hours (4p -7a ) and weekends on [email or web address]  Patient seen and examined with the above-signed Advanced Practice Provider and/or Housestaff. I personally reviewed laboratory data, imaging studies and relevant notes. I independently examined the patient and formulated the important aspects of the plan. I have edited the note to reflect any of my changes or salient points. I have personally discussed the plan with the patient and/or family.  Results of cath reviewed with him at length.   Feels ok today. Remains on DBA at 3. Co-ox 84%. CVP 9-10. Denies CP or SOB.  Creatinine 1.7 -> 1.9 -> 2.33  General:  Sitting up in chair. No resp difficulty HEENT: normal Neck: supple. JVP 10 Carotids 2+ bilat; no bruits. No lymphadenopathy or thryomegaly appreciated. Cor: PMI nondisplaced. Regular rate & rhythm. 2/6 MR Lungs: clear Abdomen: soft,  nontender, nondistended. No hepatosplenomegaly. No bruits or masses. Good bowel sounds. Extremities: no cyanosis, clubbing, rash, tr edema Neuro: alert & orientedx3, cranial nerves grossly intact. moves all 4 extremities w/o difficulty. Affect pleasant  We had long talk about options yesterday. We discussed 2v PCI (OM and RCA) + Mitraclip vs VAD. He chose the former.   However today has AKI likely due to contrast nephropathy.   Will need to stop diuretics and follow. If AKI mld will continue with current plan after kidneys recover. If more persistent will have to revisit.  Continue DBA. Avoid all nephrotoxic meds. Check renal u/s tomorrow.  Glori Bickers, MD  3:27 PM

## 2020-11-23 ENCOUNTER — Ambulatory Visit: Payer: HMO | Admitting: Interventional Cardiology

## 2020-11-23 DIAGNOSIS — I255 Ischemic cardiomyopathy: Secondary | ICD-10-CM | POA: Diagnosis not present

## 2020-11-23 DIAGNOSIS — I340 Nonrheumatic mitral (valve) insufficiency: Secondary | ICD-10-CM | POA: Diagnosis not present

## 2020-11-23 LAB — CBC
HCT: 25.9 % — ABNORMAL LOW (ref 39.0–52.0)
Hemoglobin: 8.1 g/dL — ABNORMAL LOW (ref 13.0–17.0)
MCH: 25.4 pg — ABNORMAL LOW (ref 26.0–34.0)
MCHC: 31.3 g/dL (ref 30.0–36.0)
MCV: 81.2 fL (ref 80.0–100.0)
Platelets: 192 10*3/uL (ref 150–400)
RBC: 3.19 MIL/uL — ABNORMAL LOW (ref 4.22–5.81)
RDW: 18.5 % — ABNORMAL HIGH (ref 11.5–15.5)
WBC: 7.3 10*3/uL (ref 4.0–10.5)
nRBC: 0 % (ref 0.0–0.2)

## 2020-11-23 LAB — GLUCOSE, CAPILLARY
Glucose-Capillary: 144 mg/dL — ABNORMAL HIGH (ref 70–99)
Glucose-Capillary: 241 mg/dL — ABNORMAL HIGH (ref 70–99)
Glucose-Capillary: 199 mg/dL — ABNORMAL HIGH (ref 70–99)
Glucose-Capillary: 293 mg/dL — ABNORMAL HIGH (ref 70–99)

## 2020-11-23 LAB — BASIC METABOLIC PANEL
Anion gap: 9 (ref 5–15)
BUN: 47 mg/dL — ABNORMAL HIGH (ref 8–23)
CO2: 29 mmol/L (ref 22–32)
Calcium: 8.8 mg/dL — ABNORMAL LOW (ref 8.9–10.3)
Chloride: 96 mmol/L — ABNORMAL LOW (ref 98–111)
Creatinine, Ser: 3.23 mg/dL — ABNORMAL HIGH (ref 0.61–1.24)
GFR, Estimated: 20 mL/min — ABNORMAL LOW (ref >60)
Glucose, Bld: 159 mg/dL — ABNORMAL HIGH (ref 70–99)
Potassium: 4.3 mmol/L (ref 3.5–5.1)
Sodium: 134 mmol/L — ABNORMAL LOW (ref 135–145)

## 2020-11-23 LAB — COOXEMETRY PANEL
Carboxyhemoglobin: 1.7 % — ABNORMAL HIGH (ref 0.5–1.5)
Methemoglobin: 0.6 % (ref 0.0–1.5)
O2 Saturation: 60.3 %
Total hemoglobin: 8.4 g/dL — ABNORMAL LOW (ref 12.0–16.0)

## 2020-11-23 NOTE — Consult Note (Signed)
[facility]   MULTIDISCIPLINARY HEART VALVE TEAM  Date:  11/23/2020   ID:  Marc [name], DOB [birth year], MRN [id number]  PCP:  Lawerance Cruel, MD   CC Shortness of breath   HISTORY OF PRESENT ILLNESS: Marc [name] is a 70 y.o. male who presents for evaluation of severe mitral regurgitation, referred by Dr Haroldine Laws and Dr Tamala Julian.  The patient has a history of type 2 diabetes, coronary artery disease with history of anterior MI and a chronic ischemic cardiomyopathy, hypertension, mixed hyperlipidemia, symptomatic anemia, and stage III chronic kidney disease.  Over the past month he has developed progressive fatigue, shortness of breath, and severe exercise intolerance.  The patient was hospitalized with signs and symptoms of low output congestive heart failure and volume overload.  Right heart catheterization is performed and demonstrates findings consistent with secondary pulmonary hypertension with a mean PA pressure of 36 mmHg, elevated wedge pressure with a mean wedge pressure of 25 and a V wave of 36 mmHg, cardiac index of 2.38, and low pulmonary artery oxygen saturations of 46 and 50%.  Thermodilution cardiac output is 3.8 with a cardiac index of 2.0.  He was evaluated by the advanced heart failure team and started on dobutamine.  An echocardiogram demonstrated further decline in LV function with an LVEF of 20 to 25% reduced from previous LVEF of 35 to 40%.  There is severe mitral regurgitation demonstrated.  He then underwent cardiac catheterization 11/21/2020 demonstrating severe multivessel coronary artery disease with chronic total occlusion of the mid LAD supplied by left to left collaterals, severe stenosis of the proximal LAD, patency of the left main with mild nonobstructive plaquing, severe stenosis of the left circumflex/first OM of 95%, and severe stenosis in the mid RCA of 80 to 90%.  The patient remains on dobutamine.  He has been considered for advanced  heart failure therapy with VAD support versus treatment with a combination of PCI and transcatheter edge-to-edge mitral valve repair.  Because of his severe comorbid conditions with cardiogenic shock, large area of prior LAD infarct, and acute on chronic kidney disease, he has not been considered for cardiac surgery.  Structural heart consultation is requested to consider treatment options for the above problems.  The patient is alone today at the time of my interview with him.  He describes good functional status over the years until about 1 month ago when he developed marked progression of exercise intolerance, fatigue, shortness of breath, orthopnea, and leg edema.  As outlined above, he developed progressive anemia felt to have precipitated worsening congestive heart failure symptoms.  He has not had any anginal symptoms.  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  He is married and has a grown daughter who lives in [place].  He was previously active playing racquetball before the COVID-19 pandemic.  After the racquetball courts were closed, he started swimming at the [facility].  Again, he has done well from a functional standpoint until about 1 month ago.  Past Medical History:  Diagnosis Date  . Cardiomyopathy ([place])    Left ventricle dysfunction with LVEF 35-40% by echo March 2009  . Chest pain   . Coronary atherosclerosis of native coronary artery    with large anterior myocardial infarction 1997. PTCA LAD. Distal circumflex and first diagonal DES 2011  . Diabetes ([place])   . Heart failure ([place])   . HTN (hypertension)   . Hypercholesteremia   . Hypercholesteremia   .  Hyperlipidemia   . Hyperlipidemia   . Psoriasis   . Sciatica     Current Facility-Administered Medications  Medication Dose Route Frequency Provider Last Rate Last Admin  . 0.9 %  sodium chloride infusion (Manually program via Guardrails IV Fluids)   Intravenous Once Belva Crome, MD       . 0.9 %  sodium chloride infusion  250 mL Intravenous PRN Belva Crome, MD      . acetaminophen (TYLENOL) tablet 650 mg  650 mg Oral Q4H PRN Belva Crome, MD      . aspirin EC tablet 81 mg  81 mg Oral Daily Bensimhon, Shaune Pascal, MD   81 mg at 11/22/20 [id number]  . atorvastatin (LIPITOR) tablet 40 mg  40 mg Oral Daily Belva Crome, MD   40 mg at 11/22/20 [id number]  . calcium carbonate (TUMS - dosed in mg elemental calcium) chewable tablet 200 mg of elemental calcium  1 tablet Oral BID WC Belva Crome, MD   200 mg of elemental calcium at 11/22/20 1844  . calcium carbonate (TUMS - dosed in mg elemental calcium) chewable tablet 200 mg of elemental calcium  1 tablet Oral BID PRN Belva Crome, MD   200 mg of elemental calcium at 11/18/20 2056  . Chlorhexidine Gluconate Cloth 2 % PADS 6 each  6 each Topical Daily Belva Crome, MD   6 each at 11/22/20 1135  . DOBUTamine (DOBUTREX) infusion 4000 mcg/mL  3 mcg/kg/min Intravenous Titrated Belva Crome, MD 3.64 mL/hr at 11/23/20 0648 3 mcg/kg/min at 11/23/20 0648  . enoxaparin (LOVENOX) injection 40 mg  40 mg Subcutaneous Q24H Belva Crome, MD   40 mg at 11/22/20 [phone]  . ezetimibe (ZETIA) tablet 10 mg  10 mg Oral Daily Belva Crome, MD   10 mg at 11/22/20 [id number]  . fluticasone (FLONASE) 50 MCG/ACT nasal spray 2 spray  2 spray Each Nare BID Belva Crome, MD   2 spray at 11/22/20 2120  . guaiFENesin-dextromethorphan (ROBITUSSIN DM) 100-10 MG/5ML syrup 5 mL  5 mL Oral Q4H PRN Belva Crome, MD   5 mL at 11/22/20 2121  . insulin aspart (novoLOG) injection 0-15 Units  0-15 Units Subcutaneous TID AC & HS Belva Crome, MD   2 Units at 11/23/20 [phone]  . multivitamin with minerals tablet 1 tablet  1 tablet Oral Daily Belva Crome, MD   1 tablet at 11/22/20 [phone]  . omega-3 acid ethyl esters (LOVAZA) capsule 1 g  1 g Oral Daily Belva Crome, MD   1 g at 11/22/20 [phone]  . ondansetron (ZOFRAN) injection 4 mg  4 mg Intravenous Q6H PRN Belva Crome, MD      .  pantoprazole (PROTONIX) EC tablet 40 mg  40 mg Oral Daily Belva Crome, MD   40 mg at 11/22/20 [id number]  . polyethylene glycol (MIRALAX / GLYCOLAX) packet 17 g  17 g Oral Daily Belva Crome, MD   17 g at 11/22/20 [phone]  . sodium chloride flush (NS) 0.9 % injection 10-40 mL  10-40 mL Intracatheter Q12H Belva Crome, MD   10 mL at 11/22/20 2125  . sodium chloride flush (NS) 0.9 % injection 10-40 mL  10-40 mL Intracatheter PRN Belva Crome, MD      . sodium chloride flush (NS) 0.9 % injection 3 mL  3 mL Intravenous PRN Belva Crome, MD      . zolpidem (  AMBIEN) tablet 5 mg  5 mg Oral Once Meade Maw, MD        ALLERGIES:   Candesartan cilexetil and Spironolactone   SOCIAL HISTORY:  The patient  reports that he has never smoked. He has never used smokeless tobacco. He reports current alcohol use. He reports that he does not use drugs.   FAMILY HISTORY:  The patient's family history includes Arrhythmia in his mother; Diabetes in his mother; Healthy in his brother and sister; Heart failure in his mother; Hypertension in his mother; Prostate cancer in his brother and father; Stroke in his father; Thyroid disease in his brother.   REVIEW OF SYSTEMS: Negative except as per HPI.  All other systems are reviewed and negative.   PHYSICAL EXAM: VS:  BP 113/82 (BP Location: Left Arm)   Pulse (!) 121   Temp 97.8 F (36.6 C) (Oral)   Resp 18   Ht '5\' 7"'$  (1.702 m)   Wt 73.3 kg   SpO2 99%   BMI 25.31 kg/m  , BMI Body mass index is 25.31 kg/m. GEN: Well nourished, well developed, in no acute distress HEENT: normal Neck: No JVD. carotids 2+ without bruits or masses Cardiac: The heart is tachycardic and regular with an S3 gallop and a 3/6 apical systolic murmur.  The legs are wrapped.  Respiratory:  clear to auscultation bilaterally GI: soft, nontender, nondistended, + BS MS: no deformity or atrophy Skin: warm and dry, no rash Neuro:  Strength and sensation are intact Psych: euthymic mood,  full affect   RECENT LABS: 10/24/2020: NT-Pro BNP 1,685 11/11/2020: ALT 25; B Natriuretic Peptide 1,728.5; TSH 3.685 11/19/2020: Magnesium 2.0 11/23/2020: BUN 47; Creatinine, Ser 3.23; Hemoglobin 8.1; Platelets 192; Potassium 4.3; Sodium 134  No results found for requested labs within last 8760 hours.   Estimated Creatinine Clearance: 20.2 mL/min (A) (by C-G formula based on SCr of 3.23 mg/dL (H)).   Wt Readings from Last 3 Encounters:  11/23/20 73.3 kg  10/30/20 77.3 kg  10/24/20 76.7 kg     CARDIAC STUDIES:  Cardiac Cath:  Conclusion   Severe multivessel CAD  Known chronic total occlusion of the mid LAD.  Left to left collaterals are noted.  Proximal to mid LAD 90% before septal perforators.  Large branching first diagonal with a patent stent in the mid to distal segment beyond the proximal bifurcation.  Eccentric 80% stenosis within a bend in the proximal vessel just after the origin from the LAD.  A smaller branch contains 95% stenosis after the proximal bifurcation.  30% ostial left main  Diffuse disease in the proximal circumflex with up to 50% narrowing.  The dominant first obtuse marginal contains segmental 95% stenosis.  The mid first obtuse marginal contains a patent stent.  The continuation of the circumflex gives origin to 2 small obtuse marginal branches.  Right coronary is dominant.  First images were taken on a 10 inch magnification.  There are tandem 85% followed by 75% proximal to mid LAD stenoses.  Distally at the bifurcation with the PDA there is 75% stenosis.  The ostium of the PDA contains 85% stenosis.  The PDA has slight left to right filling by collaterals.  LVEDP 25 mmHg on IV dobutamine.  RECOMMENDATIONS:   There are targets for PCI.  If PCI chosen this would be a high risk procedure and likely would require hemodynamic support.  Targets are the circumflex obtuse marginal, proximal diagonal #1, and proximal to mid RCA.  The distal RCA  bifurcation would be  more tricky.  Significant mitral regurgitation.  Possible MitraClip target if appropriate.  Need to definitively determine whether surgical revascularization is possible.  Advanced therapies for stage IV CHF will be required to hopefully better stabilize the patient if PCI is to be contemplated.  Diagnostic Dominance: Right  Left Anterior Descending  There is moderate diffuse disease throughout the vessel.  Collaterals  Dist LAD filled by collaterals from 1st Mrg.    Mid LAD-1 lesion is 90% stenosed.  Mid LAD-2 lesion is 80% stenosed.  Mid LAD to Dist LAD lesion is 100% stenosed.  First Diagonal Branch  1st Diag-1 lesion is 85% stenosed.  1st Diag-2 lesion is 30% stenosed. The lesion was previously treated.  Left Circumflex  Vessel is moderate in size. There is moderate diffuse disease throughout the vessel.  Prox Cx lesion is 50% stenosed.  First Obtuse Marginal Branch  There is moderate disease in the vessel.  1st Mrg-1 lesion is 95% stenosed.  1st Mrg-2 lesion is 10% stenosed. The lesion was previously treated.  Second Obtuse Marginal Branch  Vessel is small in size.  Third Obtuse Marginal Branch  Vessel is small in size.  Right Coronary Artery  Vessel is small. There is mild diffuse disease throughout the vessel.  Prox RCA lesion is 85% stenosed.  Prox RCA to Mid RCA lesion is 75% stenosed.  Mid RCA lesion is 50% stenosed.  Dist RCA lesion is 70% stenosed.  Right Posterior Descending Artery  There is mild disease in the vessel.  Collaterals  RPDA filled by collaterals from 1st Sept.    RPDA lesion is 80% stenosed.  Right Posterior Atrioventricular Artery  There is mild disease in the vessel.   Intervention   No interventions have been documented.  Left Heart  Left Ventricle There is severe left ventricular systolic dysfunction. LV end diastolic pressure is moderately elevated. The left ventricular ejection fraction is less than 25% by visual estimate. LV  function has been assessed by cardiac MRI   Coronary Diagrams   Diagnostic Dominance: Right      Echo 11/12/2020: 1. Global hypokinesis with akinesis of the apex; overall severe LV  dysfunction.  2. Left ventricular ejection fraction, by estimation, is 20 to 25%. The  left ventricle has severely decreased function. The left ventricle  demonstrates regional wall motion abnormalities (see scoring  diagram/findings for description). The left  ventricular internal cavity size was mildly dilated. Left ventricular  diastolic parameters are indeterminate.  3. Right ventricular systolic function is normal. The right ventricular  size is normal. There is moderately elevated pulmonary artery systolic  pressure.  4. Left atrial size was moderately dilated.  5. Moderate pleural effusion in the left lateral region.  6. The mitral valve is normal in structure. Moderate to severe mitral  valve regurgitation. No evidence of mitral stenosis.  7. The aortic valve is tricuspid. Aortic valve regurgitation is not  visualized. No aortic stenosis is present.  8. The inferior vena cava is dilated in size with >50% respiratory  variability, suggesting right atrial pressure of 8 mmHg.  ASSESSMENT AND PLAN: 70 year old gentleman with acute on chronic systolic heart failure secondary to severe underlying ischemic cardiomyopathy, low output currently on low-dose dobutamine.  Patient found to have severe right coronary artery and left circumflex stenoses with previously infarcted anterior wall and chronically occluded mid LAD.  He is also demonstrated to have severe mitral regurgitation in the setting of his congestive heart failure with severe LV dysfunction.  In reviewing his echo study, the primary mechanism appears to be Carpentier type I and type IIIb disease with LV dysfunction, annular dilatation, and systolic leaflet restriction causing mild coaptation.   I have reviewed the natural history of  ischemic mitral regurgitation with the patient today. We have discussed the limitations of medical therapy and the poor prognosis associated with symptomatic mitral regurgitation. We have also reviewed potential treatment options, including palliative medical therapy, conventional surgical mitral valve repair or replacement, and percutaneous mitral valve therapies such as edge-to-edge mitral valve approximation with MitraClip. We discussed treatment options in the context of this patient's specific comorbid medical conditions.  The patient is critically ill with low output congestive heart failure and significant contrast nephropathy with current creatinine greater than 3 mg/dL.  Dr. Haroldine Laws has reviewed treatment options with him, including advanced heart failure therapies such as a ventricular assist device.  We have also discussed the possibility of PCI and transcatheter edge-to-edge mitral valve repair.  The patient would not be a candidate for conventional heart surgery with CABG/mitral valve repair due to his severe comorbidities.  The patient is clearly inclined to pursue PCI and transcatheter edge-to-edge mitral valve repair if at all possible.  At the present time, his renal function is prohibitive.  He understands that it will likely take several days to see where his new baseline settles out.  In the meantime, he remains on low-dose dobutamine.  He will be followed closely by the advanced heart failure team.  I will follow up with him periodically throughout his hospitalization as indicated.  I did review the specifics of PCI and coronary stenting today.  We also discussed the MitraClip procedure, but he understands that we are not currently close to being able to proceed with this at the present time.  He will require a transesophageal echocardiogram to further evaluate the function anatomy of the mitral valve and the feasibility of Mitraclip.   Deatra James 11/23/2020 7:36 AM     [place] [place] [address]  [phone] (office) [phone] (fax)

## 2020-11-23 NOTE — Progress Notes (Addendum)
Patient ID: Marc [name], male   DOB: 1951-08-16, 70 y.o.   MRN: [id number]    Advanced Heart Failure Rounding Note   Subjective:    Dobutamine 2.5 started 3/31 for low output.  Had low CO-OX Dobtuamine increased to 4 mcg.   On DBA  3. Co-ox 60% LHC 4/5 showed multivessel CAD. There are targets for PCI, though cMRI showed limited viability.   Bump in SCr post cath, 1.66>>1.95>>3.23 .   Denies SOB. Denies chest pain.   LHC 11/21/20  Severe multivessel CAD  Known chronic total occlusion of the mid LAD.  Left to left collaterals are noted.  Proximal to mid LAD 90% before septal perforators.  Large branching first diagonal with a patent stent in the mid to distal segment beyond the proximal bifurcation.  Eccentric 80% stenosis within a bend in the proximal vessel just after the origin from the LAD.  A smaller branch contains 95% stenosis after the proximal bifurcation.  30% ostial left main  Diffuse disease in the proximal circumflex with up to 50% narrowing.  The dominant first obtuse marginal contains segmental 95% stenosis.  The mid first obtuse marginal contains a patent stent.  The continuation of the circumflex gives origin to 2 small obtuse marginal branches.  Right coronary is dominant.  First images were taken on a 10 inch magnification.  There are tandem 85% followed by 75% proximal to mid LAD stenoses.  Distally at the bifurcation with the PDA there is 75% stenosis.  The ostium of the PDA contains 85% stenosis.  The PDA has slight left to right filling by collaterals.  LVEDP 25 mmHg on IV dobutamine.  RECOMMENDATIONS:   There are targets for PCI.  If PCI chosen this would be a high risk procedure and likely would require hemodynamic support.  Targets are the circumflex obtuse marginal, proximal diagonal #1, and proximal to mid RCA.  The distal RCA bifurcation would be more tricky.  Significant mitral regurgitation.  Possible MitraClip target if appropriate.  Need to  definitively determine whether surgical revascularization is possible.  Advanced therapies for stage IV CHF will be required to hopefully better stabilize the patient if PCI is to be contemplated.    cMRI 11/20/20 1. Mildly dilated LV with EF 22%, wall motion abnormalities as noted above. 2.  Normal RV size with mildly decreased systolic function, EF [id number]. 3. Suspect severe mitral regurgitation (flow sequences to confirm were not done. 4. Delayed enhancement imaging shows extensive LGE in a coronary pattern. The peri-apical segments do not appear viable.    Objective:   Weight Range:  Vital Signs:   Temp:  [97.8 F (36.6 C)-98.6 F (37 C)] 97.8 F (36.6 C) (04/07 0400) Pulse Rate:  [111-121] 121 (04/07 0400) Resp:  [16-24] 18 (04/07 0400) BP: (91-113)/(65-82) 113/82 (04/07 0400) SpO2:  [97 %-100 %] 99 % (04/07 0400) Weight:  [73.3 kg] 73.3 kg (04/07 0400) Last BM Date: 11/22/20  Weight change: Filed Weights   11/21/20 0116 11/22/20 0003 11/23/20 0400  Weight: 74.1 kg 72.8 kg 73.3 kg    Intake/Output:   Intake/Output Summary (Last 24 hours) at 11/23/2020 0754 Last data filed at 11/23/2020 0648 Gross per 24 hour  Intake 745.75 ml  Output 550 ml  Net 195.75 ml   CVP 5-6  General:  Sitting in the chair.  No resp difficulty HEENT: normal Neck: supple. no JVD. Carotids 2+ bilat; no bruits. No lymphadenopathy or thryomegaly appreciated. Cor: PMI nondisplaced. Regular rate & rhythm. No  rubs, or murmurs. +S3 Lungs: clear Abdomen: soft, nontender, nondistended. No hepatosplenomegaly. No bruits or masses. Good bowel sounds. Extremities: no cyanosis, clubbing, rash, edema. RUE PICC. Unna boots Neuro: alert & orientedx3, cranial nerves grossly intact. moves all 4 extremities w/o difficulty. Affect pleasant   Telemetry: Sinus Tach 100s   Labs: Basic Metabolic Panel: Recent Labs  Lab 11/17/20 0648 11/18/20 1111 11/19/20 0500 11/20/20 0540 11/20/20 1121 11/21/20 0402  11/22/20 0519 11/22/20 1043 11/23/20 0437  NA 133*   < > 135   < > 135 136 134* 132* 134*  K 3.8   < > 3.8   < > 4.1 3.4* 3.7 4.2 4.3  CL 99   < > 100   < > 98 95* 95* 93* 96*  CO2 25   < > 27   < > '29 31 30 30 29  '$ GLUCOSE 119*   < > 168*   < > 219* 147* 128* 241* 159*  BUN 39*   < > 29*   < > 24* 28* 34* 38* 47*  CREATININE 1.66*   < > 1.52*   < > 1.48* 1.66* 1.95* 2.33* 3.23*  CALCIUM 8.6*   < > 8.8*   < > 9.2 9.3 8.7* 9.1 8.8*  MG 2.2  --  2.0  --   --   --   --   --   --    < > = values in this interval not displayed.    Liver Function Tests: No results for input(s): AST, ALT, ALKPHOS, BILITOT, PROT, ALBUMIN in the last 168 hours. No results for input(s): LIPASE, AMYLASE in the last 168 hours. No results for input(s): AMMONIA in the last 168 hours.  CBC: Recent Labs  Lab 11/19/20 0500 11/20/20 0540 11/21/20 0402 11/22/20 0519 11/23/20 0437  WBC 7.2 8.0 7.4 6.6 7.3  HGB 7.2* 8.3* 8.5* 8.1* 8.1*  HCT 23.6* 26.4* 26.9* 26.2* 25.9*  MCV 80.5 80.7 81.5 81.1 81.2  PLT 224 211 228 199 192    Cardiac Enzymes: No results for input(s): CKTOTAL, CKMB, CKMBINDEX, TROPONINI in the last 168 hours.  BNP: BNP (last 3 results) Recent Labs    11/11/20 2158  BNP 1,728.5*    ProBNP (last 3 results) Recent Labs    10/24/20 1119  PROBNP 1,685*      Other results:  Imaging: CARDIAC CATHETERIZATION  Result Date: 11/21/2020  Severe multivessel CAD  Known chronic total occlusion of the mid LAD.  Left to left collaterals are noted.  Proximal to mid LAD 90% before septal perforators.  Large branching first diagonal with a patent stent in the mid to distal segment beyond the proximal bifurcation.  Eccentric 80% stenosis within a bend in the proximal vessel just after the origin from the LAD.  A smaller branch contains 95% stenosis after the proximal bifurcation.  30% ostial left main  Diffuse disease in the proximal circumflex with up to 50% narrowing.  The dominant first  obtuse marginal contains segmental 95% stenosis.  The mid first obtuse marginal contains a patent stent.  The continuation of the circumflex gives origin to 2 small obtuse marginal branches.  Right coronary is dominant.  First images were taken on a 10 inch magnification.  There are tandem 85% followed by 75% proximal to mid LAD stenoses.  Distally at the bifurcation with the PDA there is 75% stenosis.  The ostium of the PDA contains 85% stenosis.  The PDA has slight left to right filling by collaterals.  LVEDP 25 mmHg on IV dobutamine. RECOMMENDATIONS:  There are targets for PCI.  If PCI chosen this would be a high risk procedure and likely would require hemodynamic support.  Targets are the circumflex obtuse marginal, proximal diagonal #1, and proximal to mid RCA.  The distal RCA bifurcation would be more tricky.  Significant mitral regurgitation.  Possible MitraClip target if appropriate.  Need to definitively determine whether surgical revascularization is possible.  Advanced therapies for stage IV CHF will be required to hopefully better stabilize the patient if PCI is to be contemplated.  US RENAL  Result Date: 11/22/2020 CLINICAL DATA:  Acute kidney injury EXAM: RENAL / URINARY TRACT ULTRASOUND COMPLETE COMPARISON:  None. FINDINGS: Right Kidney: Renal measurements: 10.1 x 4.3 x 4.9 cm = volume: 111 mL. Echogenicity within normal limits. No mass or hydronephrosis visualized. Left Kidney: Renal measurements: 11.9 x 5.3 x 4.8 cm = volume: 158 mL. Echogenicity within normal limits. Small simple appearing cyst in the medial left kidney measuring 1.8 cm maximal diameter. No follow-up is indicated. No solid mass or hydronephrosis visualized. Bladder: Appears normal for degree of bladder distention. Other: Incidental note of cholelithiasis with stones and sludge in the gallbladder. No pericholecystic edema. Moderate left pleural effusion. IMPRESSION: 1. Small benign-appearing cyst in the left kidney. No  follow-up required. Kidneys and bladder are otherwise normal. 2. Incidental note of cholelithiasis and sludge in the gallbladder. 3. Moderate left pleural effusion. Electronically Signed   By: Lucienne Capers M.D.   On: 11/22/2020 21:56     Medications:     Scheduled Medications: . sodium chloride   Intravenous Once  . aspirin EC  81 mg Oral Daily  . atorvastatin  40 mg Oral Daily  . calcium carbonate  1 tablet Oral BID WC  . Chlorhexidine Gluconate Cloth  6 each Topical Daily  . enoxaparin (LOVENOX) injection  40 mg Subcutaneous Q24H  . ezetimibe  10 mg Oral Daily  . fluticasone  2 spray Each Nare BID  . insulin aspart  0-15 Units Subcutaneous TID AC & HS  . multivitamin with minerals  1 tablet Oral Daily  . omega-3 acid ethyl esters  1 g Oral Daily  . pantoprazole  40 mg Oral Daily  . polyethylene glycol  17 g Oral Daily  . sodium chloride flush  10-40 mL Intracatheter Q12H  . zolpidem  5 mg Oral Once    Infusions: . sodium chloride    . DOBUTamine 3 mcg/kg/min (11/23/20 0648)    PRN Medications: sodium chloride, acetaminophen, calcium carbonate, guaiFENesin-dextromethorphan, ondansetron (ZOFRAN) IV, sodium chloride flush, sodium chloride flush   Assessment/Plan:   1. A/C Systolic Heart Failure,  ICM  - ECHO 2019 35-40%  - ECHO EF 20-25% RV normal Mod -Severe MR.  - RHC 11/16/20 - PCWP 25, RA 8, PA sat 46%  - Cu back dobutamine 2 mcg.  CO-OX 60%.   - CVP 5-6 . Hold torsemide today. Creatinine continues to rise.  -  BB and ARNI held due to hypotension.  - Intolerant SGT2i with yeast infections.  - No dig yet with AKI   - Continue unna boots.  - cMRI 11/20/20 EF 22% with mild-mod RV dysfunction. Diffuse LGE suggestive of severe ischemic CM. Little viable myocardium - In looking at MRI, Suspect this is end-stage ischemic CM with low output. Cath w/ severe MVCAD but given limited viability on cMRI doubt revascularization will give him durable benefit. May get some benefit  from MitraClip but I suspect most  durable option remains VAD. And with CKD and RV dysfunction need to be careful not to miss his window.    2. Chronic Anemia  - EGD 2022 - normal. No recent colonoscopy - FOBT negative - Received feraheme this admit.  - SPEP negative -  EPO 50  - 4/3 Given 1UPRBCs. Hgb 7.2>8.5>8.1 - On protonix.  - If VAD planned will need colonoscopy prior.   3. CAD - Anterior MI 1997 PTCA LAD distal circ and 1st diagonal DES 2011 - LHC this admit w/ severe MVCAD  - There are targets for PCI.  However if PCI is chosen this would be a high risk procedure and likely would require hemodynamic support. Also w/ cMRI showing limited viability, doubt there would be much benefit to revascularization. Will need to d/w interventional team and CT surgery. VAD may be only viable option.   - continue ASA + statin + Zetia    4. AKI on CKD Stage IIIb - Creatinine baseline 1.6-1.8 , creatinine 1.66>>1.95>>3.23  , Suspect contrast nephropathy.  continue DBA - Hold diuetics.     5. Severe MR - this is functional.  LV not markedly dilated with LVEDD 5.8 cm, could consider Mitraclip but doubt this will be durable solution for him - D/w Dr. Burt Knack who will review  Discussed with Dr Tamala Julian at the bedside.    Length of Stay: [place] NP-C  11/23/2020, 7:54 AM  Advanced Heart Failure Team Pager [phone] (M-F; 7a - 4p)  Please contact [facility] for night-coverage after hours (4p -7a ) and weekends on [email or web address]  Patient seen and examined with the above-signed Advanced Practice Provider and/or Housestaff. I personally reviewed laboratory data, imaging studies and relevant notes. I independently examined the patient and formulated the important aspects of the plan. I have edited the note to reflect any of my changes or salient points. I have personally discussed the plan with the patient and/or family.  Remains on DBA 3. Co-ox ok. CVP 4-5 but still with LE edema. Main issue  is that creatinine continues to rise. Now 3.2 likely due to contrast nephropathy   General:  Sitting up in bed . No resp difficulty HEENT: normal Neck: supple. JVP 5. Carotids 2+ bilat; no bruits. No lymphadenopathy or thryomegaly appreciated. Cor: PMI nondisplaced. Regular rate & rhythm. 3/6 MR Lungs: clear Abdomen: soft, nontender, nondistended. No hepatosplenomegaly. No bruits or masses. Good bowel sounds. Extremities: no cyanosis, clubbing, rash, 1+ edema Neuro: alert & orientedx3, cranial nerves grossly intact. moves all 4 extremities w/o difficulty. Affect pleasant  Remains very tenuous. 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 Renal u/s ok. I think this makes next steps very difficult. Chance of progressing to ESRD with VAD would be extremely high and likely takes VAD off the table.  Ideally would get 2v PCI and MitraClip but risk of recurrent CIN almost prohibitive currently. Will continue to follow renal function. Hopefully will recover. May do best with a short course of home inotropes and then bring back for procedures.  Glori Bickers, MD  4:34 PM

## 2020-11-23 NOTE — Progress Notes (Signed)
Orthopedic Tech Progress Note Patient Details:  Marc [name] 26-Feb-1951 [id number]  Ortho Devices Type of Ortho Device: Louretta Parma boot Ortho Device/Splint Location: BLE Ortho Device/Splint Interventions: Ordered,Application   Post Interventions Patient Tolerated: Well Instructions Provided: Care of device,Poper ambulation with device   [name] 11/23/2020, 5:01 PM

## 2020-11-23 NOTE — Care Management Important Message (Signed)
Important Message  Patient Details  Name: PARV RASHID MRN: [id number] Date of Birth: 31-May-1951   Medicare Important Message Given:  Yes     [name] 11/23/2020, 8:16 AM

## 2020-11-23 NOTE — Progress Notes (Signed)
   11/23/20 1950  Assess: MEWS Score  Temp 98 F (36.7 C)  BP 100/72  Resp (!) 22  Level of Consciousness Alert  O2 Device Nasal Cannula  O2 Flow Rate (L/min) 1 L/min  Assess: MEWS Score  MEWS Temp 0  MEWS Systolic 1  MEWS Pulse 2  MEWS RR 1  MEWS LOC 0  MEWS Score 4  MEWS Score Color Red  Assess: if the MEWS score is Yellow or Red  Were vital signs taken at a resting state? Yes  Focused Assessment No change from prior assessment  Early Detection of Sepsis Score *See Row Information* Low  MEWS guidelines implemented *See Row Information* No, other (Comment) (pt has had low BP and high HR since admission, this has been pt's baseline)  Document  Patient Outcome Stabilized after interventions  Progress note created (see row info) Yes

## 2020-11-23 NOTE — Progress Notes (Signed)
   11/23/20 2000  Assess: MEWS Score  BP 103/70  Pulse Rate (!) 113  ECG Heart Rate (!) 114  Resp (!) 24  SpO2 100 %  Assess: MEWS Score  MEWS Temp 0  MEWS Systolic 0  MEWS Pulse 2  MEWS RR 1  MEWS LOC 0  MEWS Score 3  MEWS Score Color Yellow  Assess: if the MEWS score is Yellow or Red  Were vital signs taken at a resting state? Yes  Focused Assessment No change from prior assessment  Early Detection of Sepsis Score *See Row Information* Low  MEWS guidelines implemented *See Row Information* No, previously yellow, continue vital signs every 4 hours  Document  Patient Outcome Stabilized after interventions  Progress note created (see row info) Yes

## 2020-11-24 DIAGNOSIS — I255 Ischemic cardiomyopathy: Secondary | ICD-10-CM | POA: Diagnosis not present

## 2020-11-24 LAB — GLUCOSE, CAPILLARY
Glucose-Capillary: 130 mg/dL — ABNORMAL HIGH (ref 70–99)
Glucose-Capillary: 324 mg/dL — ABNORMAL HIGH (ref 70–99)
Glucose-Capillary: 207 mg/dL — ABNORMAL HIGH (ref 70–99)
Glucose-Capillary: 181 mg/dL — ABNORMAL HIGH (ref 70–99)

## 2020-11-24 LAB — BASIC METABOLIC PANEL
Anion gap: 13 (ref 5–15)
BUN: 54 mg/dL — ABNORMAL HIGH (ref 8–23)
CO2: 27 mmol/L (ref 22–32)
Calcium: 8.7 mg/dL — ABNORMAL LOW (ref 8.9–10.3)
Chloride: 93 mmol/L — ABNORMAL LOW (ref 98–111)
Creatinine, Ser: 3.88 mg/dL — ABNORMAL HIGH (ref 0.61–1.24)
GFR, Estimated: 16 mL/min — ABNORMAL LOW (ref >60)
Glucose, Bld: 133 mg/dL — ABNORMAL HIGH (ref 70–99)
Potassium: 3.8 mmol/L (ref 3.5–5.1)
Sodium: 133 mmol/L — ABNORMAL LOW (ref 135–145)

## 2020-11-24 LAB — COOXEMETRY PANEL
Carboxyhemoglobin: 1.9 % — ABNORMAL HIGH (ref 0.5–1.5)
Methemoglobin: 0.9 % (ref 0.0–1.5)
O2 Saturation: 59.9 %
Total hemoglobin: 8 g/dL — ABNORMAL LOW (ref 12.0–16.0)

## 2020-11-24 MED ORDER — ENOXAPARIN SODIUM 30 MG/0.3ML ~~LOC~~ SOLN
30.0000 mg | SUBCUTANEOUS | Status: DC
  Administered 2020-11-24 – 2020-12-04 (×11): 30 mg via SUBCUTANEOUS
  Filled 2020-11-24 (×12): qty 0.3

## 2020-11-24 MED ORDER — FUROSEMIDE 10 MG/ML IJ SOLN
120.0000 mg | Freq: Two times a day (BID) | INTRAVENOUS | Status: DC
  Administered 2020-11-24 – 2020-11-26 (×5): 120 mg via INTRAVENOUS
  Filled 2020-11-24: qty 12
  Filled 2020-11-24: qty 10
  Filled 2020-11-24: qty 12
  Filled 2020-11-24 (×2): qty 10
  Filled 2020-11-24: qty 12
  Filled 2020-11-24: qty 10

## 2020-11-24 NOTE — Progress Notes (Signed)
   11/24/20 0455  Assess: MEWS Score  Temp (!) 97.2 F (36.2 C)  BP 111/72  Pulse Rate (!) 113  ECG Heart Rate (!) 113  Resp 19  SpO2 98 %  O2 Device Nasal Cannula  O2 Flow Rate (L/min) 1 L/min  Assess: MEWS Score  MEWS Temp 0  MEWS Systolic 0  MEWS Pulse 2  MEWS RR 0  MEWS LOC 0  MEWS Score 2  MEWS Score Color Yellow  Assess: if the MEWS score is Yellow or Red  Were vital signs taken at a resting state? Yes  Focused Assessment No change from prior assessment  Early Detection of Sepsis Score *See Row Information* Low  MEWS guidelines implemented *See Row Information* No, previously yellow, continue vital signs every 4 hours  Document  Patient Outcome Stabilized after interventions  Progress note created (see row info) Yes

## 2020-11-24 NOTE — Progress Notes (Signed)
   11/23/20 2336  Assess: MEWS Score  Temp 98.8 F (37.1 C)  BP 101/68  Pulse Rate (!) 114  ECG Heart Rate (!) 112  Resp 18  SpO2 100 %  O2 Device Nasal Cannula  O2 Flow Rate (L/min) 1 L/min  Assess: MEWS Score  MEWS Temp 0  MEWS Systolic 0  MEWS Pulse 2  MEWS RR 0  MEWS LOC 0  MEWS Score 2  MEWS Score Color Yellow  Assess: if the MEWS score is Yellow or Red  Were vital signs taken at a resting state? Yes  Focused Assessment No change from prior assessment  Early Detection of Sepsis Score *See Row Information* Low  MEWS guidelines implemented *See Row Information* No, previously yellow, continue vital signs every 4 hours  Document  Patient Outcome Stabilized after interventions  Progress note created (see row info) Yes

## 2020-11-24 NOTE — Progress Notes (Addendum)
Patient ID: Marc [name], male   DOB: [birth year], 70 y.o.   MRN: [id number]    Advanced Heart Failure Rounding Note   Subjective:    Dobutamine 2.5 started 3/31 for low output.  Had low CO-OX Dobtuamine increased to 4 mcg.   LHC 4/5 showed multivessel CAD. There are targets for PCI, though cMRI showed limited viability.  On 4/7 CO-OX 60%.  Dobutamine cut back to 2 mcg. CO-OX 60%.   Bump in SCr post cath, 1.66>>1.95>>3.23>>3.88 . He has been off diuretics. CVP 8. Weight up 3 pounds.   Does not like the food. Denies chest pain.    LHC 11/21/20  Severe multivessel CAD  Known chronic total occlusion of the mid LAD.  Left to left collaterals are noted.  Proximal to mid LAD 90% before septal perforators.  Large branching first diagonal with a patent stent in the mid to distal segment beyond the proximal bifurcation.  Eccentric 80% stenosis within a bend in the proximal vessel just after the origin from the LAD.  A smaller branch contains 95% stenosis after the proximal bifurcation.  30% ostial left main  Diffuse disease in the proximal circumflex with up to 50% narrowing.  The dominant first obtuse marginal contains segmental 95% stenosis.  The mid first obtuse marginal contains a patent stent.  The continuation of the circumflex gives origin to 2 small obtuse marginal branches.  Right coronary is dominant.  First images were taken on a 10 inch magnification.  There are tandem 85% followed by 75% proximal to mid LAD stenoses.  Distally at the bifurcation with the PDA there is 75% stenosis.  The ostium of the PDA contains 85% stenosis.  The PDA has slight left to right filling by collaterals.  LVEDP 25 mmHg on IV dobutamine.  RECOMMENDATIONS:   There are targets for PCI.  If PCI chosen this would be a high risk procedure and likely would require hemodynamic support.  Targets are the circumflex obtuse marginal, proximal diagonal #1, and proximal to mid RCA.  The distal RCA  bifurcation would be more tricky.  Significant mitral regurgitation.  Possible MitraClip target if appropriate.  Need to definitively determine whether surgical revascularization is possible.  Advanced therapies for stage IV CHF will be required to hopefully better stabilize the patient if PCI is to be contemplated.   cMRI 11/20/20 1. Mildly dilated LV with EF 22%, wall motion abnormalities as noted above. 2.  Normal RV size with mildly decreased systolic function, EF [id number]. 3. Suspect severe mitral regurgitation (flow sequences to confirm were not done. 4. Delayed enhancement imaging shows extensive LGE in a coronary pattern. The peri-apical segments do not appear viable.    Objective:   Weight Range:  Vital Signs:   Temp:  [97.2 F (36.2 C)-98.8 F (37.1 C)] 98 F (36.7 C) (04/08 0711) Pulse Rate:  [113-115] 113 (04/08 0711) Resp:  [18-24] 20 (04/08 0711) BP: (99-113)/(68-73) 113/73 (04/08 0711) SpO2:  [98 %-100 %] 98 % (04/08 0711) Weight:  [74.4 kg] 74.4 kg (04/08 0500) Last BM Date: 11/23/20  Weight change: Filed Weights   11/22/20 0003 11/23/20 0400 11/24/20 0500  Weight: 72.8 kg 73.3 kg 74.4 kg    Intake/Output:   Intake/Output Summary (Last 24 hours) at 11/24/2020 0855 Last data filed at 11/24/2020 0500 Gross per 24 hour  Intake 726.45 ml  Output 500 ml  Net 226.45 ml  CVP 8  General:  No resp difficulty HEENT: normal Neck: supple. JVP 7-8. Carotids 2+  bilat; no bruits. No lymphadenopathy or thryomegaly appreciated. Cor: PMI nondisplaced. Regular rate & rhythm. No rubs or murmurs. +S3 Lungs: clear Abdomen: soft, nontender, nondistended. No hepatosplenomegaly. No bruits or masses. Good bowel sounds. Extremities: no cyanosis, clubbing, rash, R and LLE unna boots.  Neuro: alert & orientedx3, cranial nerves grossly intact. moves all 4 extremities w/o difficulty. Affect pleasant  Telemetry:  Sinus Tach 110s   Labs: Basic Metabolic Panel: Recent Labs  Lab  11/19/20 0500 11/20/20 0540 11/21/20 0402 11/22/20 0519 11/22/20 1043 11/23/20 0437 11/24/20 0516  NA 135   < > 136 134* 132* 134* 133*  K 3.8   < > 3.4* 3.7 4.2 4.3 3.8  CL 100   < > 95* 95* 93* 96* 93*  CO2 27   < > '31 30 30 29 27  '$ GLUCOSE 168*   < > 147* 128* 241* 159* 133*  BUN 29*   < > 28* 34* 38* 47* 54*  CREATININE 1.52*   < > 1.66* 1.95* 2.33* 3.23* 3.88*  CALCIUM 8.8*   < > 9.3 8.7* 9.1 8.8* 8.7*  MG 2.0  --   --   --   --   --   --    < > = values in this interval not displayed.    Liver Function Tests: No results for input(s): AST, ALT, ALKPHOS, BILITOT, PROT, ALBUMIN in the last 168 hours. No results for input(s): LIPASE, AMYLASE in the last 168 hours. No results for input(s): AMMONIA in the last 168 hours.  CBC: Recent Labs  Lab 11/19/20 0500 11/20/20 0540 11/21/20 0402 11/22/20 0519 11/23/20 0437  WBC 7.2 8.0 7.4 6.6 7.3  HGB 7.2* 8.3* 8.5* 8.1* 8.1*  HCT 23.6* 26.4* 26.9* 26.2* 25.9*  MCV 80.5 80.7 81.5 81.1 81.2  PLT 224 211 228 199 192    Cardiac Enzymes: No results for input(s): CKTOTAL, CKMB, CKMBINDEX, TROPONINI in the last 168 hours.  BNP: BNP (last 3 results) Recent Labs    11/11/20 2158  BNP 1,728.5*    ProBNP (last 3 results) Recent Labs    10/24/20 1119  PROBNP 1,685*      Other results:  Imaging: US RENAL  Result Date: 11/22/2020 CLINICAL DATA:  Acute kidney injury EXAM: RENAL / URINARY TRACT ULTRASOUND COMPLETE COMPARISON:  None. FINDINGS: Right Kidney: Renal measurements: 10.1 x 4.3 x 4.9 cm = volume: 111 mL. Echogenicity within normal limits. No mass or hydronephrosis visualized. Left Kidney: Renal measurements: 11.9 x 5.3 x 4.8 cm = volume: 158 mL. Echogenicity within normal limits. Small simple appearing cyst in the medial left kidney measuring 1.8 cm maximal diameter. No follow-up is indicated. No solid mass or hydronephrosis visualized. Bladder: Appears normal for degree of bladder distention. Other: Incidental note of  cholelithiasis with stones and sludge in the gallbladder. No pericholecystic edema. Moderate left pleural effusion. IMPRESSION: 1. Small benign-appearing cyst in the left kidney. No follow-up required. Kidneys and bladder are otherwise normal. 2. Incidental note of cholelithiasis and sludge in the gallbladder. 3. Moderate left pleural effusion. Electronically Signed   By: Lucienne Capers M.D.   On: 11/22/2020 21:56     Medications:     Scheduled Medications: . sodium chloride   Intravenous Once  . aspirin EC  81 mg Oral Daily  . atorvastatin  40 mg Oral Daily  . calcium carbonate  1 tablet Oral BID WC  . Chlorhexidine Gluconate Cloth  6 each Topical Daily  . enoxaparin (LOVENOX) injection  40 mg  Subcutaneous Q24H  . ezetimibe  10 mg Oral Daily  . fluticasone  2 spray Each Nare BID  . insulin aspart  0-15 Units Subcutaneous TID AC & HS  . multivitamin with minerals  1 tablet Oral Daily  . omega-3 acid ethyl esters  1 g Oral Daily  . pantoprazole  40 mg Oral Daily  . polyethylene glycol  17 g Oral Daily  . sodium chloride flush  10-40 mL Intracatheter Q12H  . zolpidem  5 mg Oral Once    Infusions: . sodium chloride    . DOBUTamine 2 mcg/kg/min (11/23/20 0836)    PRN Medications: sodium chloride, acetaminophen, calcium carbonate, guaiFENesin-dextromethorphan, ondansetron (ZOFRAN) IV, sodium chloride flush, sodium chloride flush   Assessment/Plan:   1. A/C Systolic Heart Failure,  ICM  - ECHO 2019 35-40%  - ECHO EF 20-25% RV normal Mod -Severe MR.  - RHC 11/16/20 - PCWP 25, RA 8, PA sat 46%  - Continue dobutamine 2 mcg.  CO-OX 60%.   - CVP 8. Hold diuretics another day. Will need to start diuretics soon. Creatinine still rising. .  -  BB and ARNI held due to hypotension.  - Intolerant SGT2i with yeast infections.  - No dig yet with AKI   - Continue unna boots.  - cMRI 11/20/20 EF 22% with mild-mod RV dysfunction. Diffuse LGE suggestive of severe ischemic CM. Little viable  myocardium - In looking at MRI, Suspect this is end-stage ischemic CM with low output. Cath w/ severe MVCAD but given limited viability on cMRI doubt revascularization will give him durable benefit. May get some benefit from MitraClip but I suspect most durable option remains VAD. And with CKD and RV dysfunction need to be careful not to miss his window.    2. Chronic Anemia  - EGD 2022 - normal. No recent colonoscopy - FOBT negative - Received feraheme this admit.  - SPEP negative -  EPO 50  - 4/3 Given 1UPRBCs. Hgb 7.2>8.5>8.1>pending.  - On protonix.  - If VAD planned will need colonoscopy prior.   3. CAD - Anterior MI 1997 PTCA LAD distal circ and 1st diagonal DES 2011 - LHC this admit w/ severe MVCAD  - There are targets for PCI.  However if PCI is chosen this would be a high risk procedure and likely would require hemodynamic support. Also w/ cMRI showing limited viability, doubt there would be much benefit to revascularization. Will need to d/w interventional team and CT surgery. VAD may be only viable option.   - continue ASA + statin + Zetia    4. AKI on CKD Stage IIIb - Creatinine baseline 1.6-1.8 , creatinine 1.66>>1.95>>3.23>3.9 Suspect contrast nephropathy.  continue DBA - Hold diuetics.  - May need nephrology consult.   - Made >600 urine.    5. Severe MR - this is functional.  LV not markedly dilated with LVEDD 5.8 cm, could consider Mitraclip but doubt this will be durable solution for him - D/w Dr. Burt Knack who will review     Length of Stay: [place] NP-C  11/24/2020, 8:55 AM  Advanced Heart Failure Team Pager [phone] (M-F; [place])  Please contact [facility] for night-coverage after hours (4p -7a ) and weekends on [email or web address]  Patient seen and examined with the above-signed Advanced Practice Provider and/or Housestaff. I personally reviewed laboratory data, imaging studies and relevant notes. I independently examined the patient and formulated the  important aspects of the plan. I have edited the  note to reflect any of my changes or salient points. I have personally discussed the plan with the patient and/or family.  Feels ok. Denies CP, SOB, orthopnea or PND.   On DBA 2. Co-ox 60% CVP climbing now 13-14. Creatinine also worse. Now up to 3.9   General:  Sitting up in bed Weak appearing. No resp difficulty HEENT: normal Neck: supple. JVP to ear. Carotids 2+ bilat; no bruits. No lymphadenopathy or thryomegaly appreciated. Cor: PMI nondisplaced. Regular rate & rhythm. 3/6 MR Lungs: clear Abdomen: soft, nontender, nondistended. No hepatosplenomegaly. No bruits or masses. Good bowel sounds. Extremities: no cyanosis, clubbing, rash, 1-2+ edema  + UNNA Neuro: alert & orientedx3, cranial nerves grossly intact. moves all 4 extremities w/o difficulty. Affect pleasant  Renal function continues to worsen related to CIN. No acute indications for HD but volume status is climbing. Will increase DBA to 3. Restart IV lasix. May need to consult Renal soon if not improving but overall he is poor candidate for long-term HD.   Ideally would get 2v PCI and MitraClip at some point but with dramatic CIN likely not an option in the near future even if renal function recovers over the next week given high risk of recurrent CIN.   Glori Bickers, MD  7:02 PM

## 2020-11-24 NOTE — Progress Notes (Signed)
   11/24/20 0711  Assess: MEWS Score  Temp 98 F (36.7 C)  BP 113/73  Pulse Rate (!) 113  ECG Heart Rate (!) 114  Resp 20  SpO2 98 %  Assess: MEWS Score  MEWS Temp 0  MEWS Systolic 0  MEWS Pulse 2  MEWS RR 0  MEWS LOC 0  MEWS Score 2  MEWS Score Color Yellow  Assess: if the MEWS score is Yellow or Red  Were vital signs taken at a resting state? Yes  Focused Assessment No change from prior assessment  Early Detection of Sepsis Score *See Row Information* Low  MEWS guidelines implemented *See Row Information* No, previously yellow, continue vital signs every 4 hours  Document  Patient Outcome Stabilized after interventions  Progress note created (see row info) Yes

## 2020-11-25 DIAGNOSIS — I5043 Acute on chronic combined systolic (congestive) and diastolic (congestive) heart failure: Secondary | ICD-10-CM | POA: Diagnosis not present

## 2020-11-25 DIAGNOSIS — I255 Ischemic cardiomyopathy: Secondary | ICD-10-CM | POA: Diagnosis not present

## 2020-11-25 DIAGNOSIS — N179 Acute kidney failure, unspecified: Secondary | ICD-10-CM | POA: Diagnosis not present

## 2020-11-25 LAB — GLUCOSE, CAPILLARY
Glucose-Capillary: 156 mg/dL — ABNORMAL HIGH (ref 70–99)
Glucose-Capillary: 158 mg/dL — ABNORMAL HIGH (ref 70–99)
Glucose-Capillary: 293 mg/dL — ABNORMAL HIGH (ref 70–99)
Glucose-Capillary: 191 mg/dL — ABNORMAL HIGH (ref 70–99)
Glucose-Capillary: 234 mg/dL — ABNORMAL HIGH (ref 70–99)

## 2020-11-25 LAB — URINALYSIS, ROUTINE W REFLEX MICROSCOPIC
Bilirubin Urine: NEGATIVE
Glucose, UA: NEGATIVE mg/dL
Hgb urine dipstick: NEGATIVE
Ketones, ur: NEGATIVE mg/dL
Leukocytes,Ua: NEGATIVE
Nitrite: NEGATIVE
Protein, ur: NEGATIVE mg/dL
Specific Gravity, Urine: 1.009 (ref 1.005–1.030)
pH: 6 (ref 5.0–8.0)

## 2020-11-25 LAB — CBC
HCT: 24.7 % — ABNORMAL LOW (ref 39.0–52.0)
Hemoglobin: 7.7 g/dL — ABNORMAL LOW (ref 13.0–17.0)
MCH: 24.8 pg — ABNORMAL LOW (ref 26.0–34.0)
MCHC: 31.2 g/dL (ref 30.0–36.0)
MCV: 79.7 fL — ABNORMAL LOW (ref 80.0–100.0)
Platelets: 215 10*3/uL (ref 150–400)
RBC: 3.1 MIL/uL — ABNORMAL LOW (ref 4.22–5.81)
RDW: 18.4 % — ABNORMAL HIGH (ref 11.5–15.5)
WBC: 6.3 10*3/uL (ref 4.0–10.5)
nRBC: 0 % (ref 0.0–0.2)

## 2020-11-25 LAB — BASIC METABOLIC PANEL
Anion gap: 9 (ref 5–15)
BUN: 62 mg/dL — ABNORMAL HIGH (ref 8–23)
CO2: 27 mmol/L (ref 22–32)
Calcium: 8.6 mg/dL — ABNORMAL LOW (ref 8.9–10.3)
Chloride: 94 mmol/L — ABNORMAL LOW (ref 98–111)
Creatinine, Ser: 4.05 mg/dL — ABNORMAL HIGH (ref 0.61–1.24)
GFR, Estimated: 15 mL/min — ABNORMAL LOW (ref >60)
Glucose, Bld: 177 mg/dL — ABNORMAL HIGH (ref 70–99)
Potassium: 3.6 mmol/L (ref 3.5–5.1)
Sodium: 130 mmol/L — ABNORMAL LOW (ref 135–145)

## 2020-11-25 LAB — COOXEMETRY PANEL
Carboxyhemoglobin: 1.8 % — ABNORMAL HIGH (ref 0.5–1.5)
Methemoglobin: 0.3 % (ref 0.0–1.5)
O2 Saturation: 57.4 %
Total hemoglobin: 7 g/dL — ABNORMAL LOW (ref 12.0–16.0)

## 2020-11-25 LAB — SODIUM, URINE, RANDOM: Sodium, Ur: 47 mmol/L

## 2020-11-25 LAB — CREATININE, URINE, RANDOM: Creatinine, Urine: 66 mg/dL

## 2020-11-25 NOTE — Consult Note (Signed)
Renal Service Consult Note [facility]  EDVARDO HONSE 11/25/2020 Maree Krabbe, MD Requesting Physician: Dr. Lysle Morales, J.   Reason for Consult: Renal failure HPI: The patient is a 70 y.o. year-old w/ hx of DM2, CAD w/ prior large ant MI, chronic combined CHF, HTN, HL, CKD 3, anemia/ fe deficiency admitted on 11/11/20 for fatigue, DOE and worsening anemia x 2 weeks. He was treated lasix for vol overload and continued on coreg and entresto po. Echo showed EF 20-25%. Had recent EGD w/ no bleeding source. Rec'd IV iron.  Sherryll Burger was dc'd on 3/29. CHF team was consulted on 3/31 and started IV dobutamine for progressive functional decline and poor diuresis. CKD noted 1.6- 1.8.  Had good diuresis and was and felt better. On 4/05 pt underwent LHC w/ 70 cc IV contrast. Pt showed severe 3V CAD. There were targets for PCI, but the cMRI showed limited viability. Post LHC creat bumped to 1.66 > then 1.95 > then 3.2 > then 3.8 yesterday and 4.0 today. Asked to see for renal failure.   Pt seen in room, up on his feet. No SOB or complaints. No voiding issues such as pain, difficulty emptying bladder.   ROS  denies CP  no joint pain   no HA  no blurry vision  no rash  no diarrhea  no nausea/ vomiting  no dysuria  no difficulty voiding  no change in urine color    Past Medical History  Past Medical History:  Diagnosis Date  . Cardiomyopathy (HCC)    Left ventricle dysfunction with LVEF 35-40% by echo March 2009  . Chest pain   . Coronary atherosclerosis of native coronary artery    with large anterior myocardial infarction 1997. PTCA LAD. Distal circumflex and first diagonal DES 2011  . Diabetes (HCC)   . Heart failure (HCC)   . HTN (hypertension)   . Hypercholesteremia   . Hypercholesteremia   . Hyperlipidemia   . Hyperlipidemia   . Psoriasis   . Sciatica    Past Surgical History  Past Surgical History:  Procedure Laterality Date  . CORONARY ANGIOPLASTY WITH STENT  PLACEMENT     x 2 Dr. Katrinka Blazing  . LEFT HEART CATH AND CORONARY ANGIOGRAPHY N/A 11/21/2020   Procedure: LEFT HEART CATH AND CORONARY ANGIOGRAPHY;  Surgeon: Lyn Records, MD;  Location: [facility] INVASIVE CV LAB;  Service: Cardiovascular;  Laterality: N/A;  . RIGHT HEART CATH N/A 11/16/2020   Procedure: RIGHT HEART CATH;  Surgeon: Lyn Records, MD;  [facility] INV[facility] CV LAB;  Service: Cardiovascular;  Laterality: N/A;   Family History  Family History  Problem Relation Age of Onset  . Arrhythmia Mother   . Diabetes Mother   . Heart failure Mother   . Hypertension Mother   . Prostate cancer Father   . Stroke Father   . Thyroid disease Brother   . Healthy Sister   . Healthy Brother   . Prostate cancer Brother    Social History  reports that he has never smoked. He has never used smokeless tobacco. He reports current alcohol use. He reports that he does not use drugs. Allergies  Allergies  Allergen Reactions  . Candesartan Cilexetil Itching    Other reaction(s): itching, tingling in extrmeities  . Spironolactone     Other reaction(s): GI Upset (intolerance), gynecomastia   Home medications Prior to Admission medications   Medication Sig Start Date End Date Taking? Authorizing Provider  acetaminophen (TYLENOL) 500 MG tablet  Take 1,000 mg by mouth every 6 (six) hours as needed for mild pain.   Yes [provider]  atorvastatin (LIPITOR) 40 MG tablet TAKE 1 TABLET BY MOUTH EVERY DAY Patient taking differently: Take 40 mg by mouth daily. 08/29/20  Yes Belva Crome, MD  betamethasone dipropionate 0.05 % cream Apply 1 application topically daily as needed (rash). 09/10/19  Yes [provider]  calcium carbonate (TUMS - DOSED IN MG ELEMENTAL CALCIUM) 500 MG chewable tablet Chew 1 tablet by mouth as needed for heartburn.   Yes [provider]  carvedilol (COREG) 25 MG tablet Take 1 tablet (25 mg total) by mouth 2 (two) times daily with a meal. Patient taking  differently: Take 12.5 mg by mouth 2 (two) times daily with a meal. 01/02/16  Yes Belva Crome, MD  cetirizine-pseudoephedrine (ZYRTEC-D) 5-120 MG tablet Take 1 tablet by mouth daily.   Yes [provider]  diclofenac sodium (VOLTAREN) 1 % GEL Apply 2 g topically 4 (four) times daily as needed (knee pain).   Yes [provider]  diphenhydrAMINE HCl (ZZZQUIL) 50 MG/30ML LIQD Take 15-30 mLs by mouth at bedtime as needed (sleep).   Yes [provider]  ENTRESTO 49-51 MG TAKE 1 TABLET BY MOUTH TWICE A DAY 05/19/20  Yes Belva Crome, MD  ezetimibe (ZETIA) 10 MG tablet TAKE 1 TABLET BY MOUTH EVERY DAY Patient taking differently: Take 10 mg by mouth daily. 08/29/20  Yes Belva Crome, MD  fish oil-omega-3 fatty acids 1000 MG capsule Take 3 g by mouth daily.   Yes [provider]  furosemide (LASIX) 20 MG tablet Take 1 tablet (20 mg total) by mouth every Monday, Wednesday, and Friday. Patient taking differently: Take 40 mg by mouth daily. 10/25/20  Yes Belva Crome, MD  glimepiride (AMARYL) 4 MG tablet Take 4 mg by mouth daily. 04/29/15  Yes [provider]  iron polysaccharides (NIFEREX) 150 MG capsule Take 150 mg by mouth daily.   Yes [provider]  metFORMIN (GLUCOPHAGE) 500 MG tablet Take 1,000 mg by mouth 2 (two) times daily with a meal.    Yes [provider]  Multiple Vitamin (MULTIVITAMIN) capsule Take 1 capsule by mouth daily.   Yes [provider]  nitroGLYCERIN (NITROSTAT) 0.4 MG SL tablet PLACE 1 TABLET UNDER THE TONGUE EVERY 5 MINUTES AS NEEDED FOR CHEST PAIN Patient taking differently: Place 0.4 mg under the tongue every 5 (five) minutes as needed for chest pain. 04/21/20  Yes Belva Crome, MD  pantoprazole (PROTONIX) 40 MG tablet Take 40 mg by mouth 2 (two) times daily before a meal. 09/03/20  Yes [provider]  selenium sulfide (SELSUN) 2.5 % shampoo Apply 1 application topically daily as needed for itching.     Yes [provider]  clopidogrel (PLAVIX) 75 MG tablet TAKE 1 TABLET BY MOUTH EVERY DAY 12/31/19   Belva Crome, MD     Vitals:   11/25/20 0851 11/25/20 0900 11/25/20 1333 11/25/20 1500  BP: 93/65 103/72  93/62  Pulse: (!) 112 (!) 115  (!) 112  Resp: 18 (!) 22  17  Temp: 98 F (36.7 C) 98.1 F (36.7 C) 98.2 F (36.8 C) 97.8 F (36.6 C)  TempSrc: Oral Oral Oral Oral  SpO2: 92% 97%  100%  Weight:      Height:       Exam Gen alert, standing up in the room, no distress No rash, cyanosis or gangrene  Sclera anicteric, throat clear No jvd or bruits Chest clear bilat to bases RRR no RG Abd soft ntnd no mass or ascites +bs GU deferred  MS no joint effusions or deformity Ext 1-2 pretib edema Neuro is alert, Ox 3 , nf, no asterixis    Home meds:  - metformin 1gm bid/ amaryl 4 qd  - lipitor 40 / coreg 12.5 bid/ entresto 49-- 51 bid/ zetia 10/ lasix 40 qd/ sl ntg prn/ plavix 75 qd  - protonix 40 bid  - prn's/ vitamins/ supplements    Date  Creat  eGFR   2011- 2020 1.28- 1.52   2021  1.48- 1.51   March 2022 1.64- 1.95   April 1- 4  1.48- 1.66 44- 51, IIIa   4/5  1.66    4/6  1.95   4/7  3.23   4/8  3.88   4/9  4.05     Renal US - 10-11 cm kidneys w/o hydro, normal bladder     Na 130  K 3.6  CO2 27 BUN 64  Cr 4.05  Hb 7.7  WBC 6K    HR 110- 115,  BP 95/65, RR 18- 25  Temp afeb   UA pend     UNa, UCr pending      IV contrast 70 cc w/ LHC on 11/21/20      I/O = 7 L in and 11 L out = net neg 4 L since admission      Admit 76kg , peak 80kg on 3/31, today 74kg        Assessment/ Plan: 1. AKI on CKD 3a - b/l creatinine 1.4- 1.6, eGFR 44-51.  Creat bumped after IV contrast (LHC 4/5), rising daily but rate of rise may be declining. Creat 4.0 today. Suspect AKI due to IV contrast.  Vol status shows mild vol excess in LE"s, 2kg under admit weight and nonoliguric. UOP 800 cc yesterday. Getting IV dobutamine continuous per CHF team. Also IV lasix 120 bid started yest per  CHF team. Agree w/ management. Get UA and urine lytes. Renal US w/o obstruction.  May be starting to recover renal function. No signs of uremia, no indication for RRT at this time.  Will follow.  2. Chronic combined diast/ syst CHF - severe, LVEF 20-25% by ECHO.  Severe 3VCAD by cath, has targets for PCI but poor viability by cMRI. Started on IV dobutamine continuous on 3/31 here. If requires long-term dobutamine support, it is unlikely that he would be accepted for outpatient dialysis. Suspect that this will not be an issue and non-oliguric ATN will resolve on its own.  3. DM2 4. H/o HTN - bp's soft here, not getting any BP lowering medication 5. HL      Rob Dravon Nott  MD 11/25/2020, 3:36 PM  Recent Labs  Lab 11/23/20 0437 11/25/20 1439  WBC 7.3 6.3  HGB 8.1* 7.7*   Recent Labs  Lab 11/24/20 0516 11/25/20 0300  K 3.8 3.6  BUN 54* 62*  CREATININE 3.88* 4.05*  CALCIUM 8.7* 8.6*

## 2020-11-25 NOTE — Progress Notes (Signed)
   11/25/20 0900  Assess: MEWS Score  Temp 98.1 F (36.7 C)  BP 103/72  Pulse Rate (!) 115  Resp (!) 22  SpO2 97 %  O2 Device Room Air  Assess: MEWS Score  MEWS Temp 0  MEWS Systolic 0  MEWS Pulse 2  MEWS RR 1  MEWS LOC 0  MEWS Score 3  MEWS Score Color Yellow  Document  Progress note created (see row info) Yes   Patient continues to have elevated HR. MD aware. Scheduled medications given

## 2020-11-25 NOTE — Progress Notes (Signed)
Patient ID: Marc [name], male   DOB: [birth year], 70 y.o.   MRN: [id number]    Advanced Heart Failure Rounding Note   Subjective:    LHC 4/5 showed multivessel CAD. There are targets for PCI, though cMRI showed limited viability.  Co-ox 57% this morning, currently on dobutamine 3 with SBP 90s and HR around 110. CVP 16 this morning, started Lasix 120 mg IV bid yesterday pm, 1000 cc UOP recorded.   Bump in SCr post cath, 1.66>>1.95>>3.23>>3.88>>4.05.     LHC 11/21/20  Severe multivessel CAD  Known chronic total occlusion of the mid LAD.  Left to left collaterals are noted.  Proximal to mid LAD 90% before septal perforators.  Large branching first diagonal with a patent stent in the mid to distal segment beyond the proximal bifurcation.  Eccentric 80% stenosis within a bend in the proximal vessel just after the origin from the LAD.  A smaller branch contains 95% stenosis after the proximal bifurcation.  30% ostial left main  Diffuse disease in the proximal circumflex with up to 50% narrowing.  The dominant first obtuse marginal contains segmental 95% stenosis.  The mid first obtuse marginal contains a patent stent.  The continuation of the circumflex gives origin to 2 small obtuse marginal branches.  Right coronary is dominant.  First images were taken on a 10 inch magnification.  There are tandem 85% followed by 75% proximal to mid LAD stenoses.  Distally at the bifurcation with the PDA there is 75% stenosis.  The ostium of the PDA contains 85% stenosis.  The PDA has slight left to right filling by collaterals.  LVEDP 25 mmHg on IV dobutamine.  RECOMMENDATIONS:   There are targets for PCI.  If PCI chosen this would be a high risk procedure and likely would require hemodynamic support.  Targets are the circumflex obtuse marginal, proximal diagonal #1, and proximal to mid RCA.  The distal RCA bifurcation would be more tricky.  Significant mitral regurgitation.  Possible MitraClip  target if appropriate.  Need to definitively determine whether surgical revascularization is possible.  Advanced therapies for stage IV CHF will be required to hopefully better stabilize the patient if PCI is to be contemplated.   cMRI 11/20/20 1. Mildly dilated LV with EF 22%, wall motion abnormalities as noted above. 2.  Normal RV size with mildly decreased systolic function, EF [id number]. 3. Suspect severe mitral regurgitation (flow sequences to confirm were not done. 4. Delayed enhancement imaging shows extensive LGE in a coronary pattern. The peri-apical segments do not appear viable.    Objective:   Weight Range:  Vital Signs:   Temp:  [97.9 F (36.6 C)-98.3 F (36.8 C)] 98.1 F (36.7 C) (04/09 0727) Pulse Rate:  [110-115] 110 (04/09 0314) Resp:  [16-20] 20 (04/09 0314) BP: (92-108)/(59-71) 98/68 (04/09 0314) SpO2:  [97 %-100 %] 99 % (04/09 0314) Weight:  [74.7 kg] 74.7 kg (04/09 0107) Last BM Date: 11/23/20  Weight change: Filed Weights   11/23/20 0400 11/24/20 0500 11/25/20 0107  Weight: 73.3 kg 74.4 kg 74.7 kg    Intake/Output:   Intake/Output Summary (Last 24 hours) at 11/25/2020 0827 Last data filed at 11/25/2020 0700 Gross per 24 hour  Intake 994.11 ml  Output 1000 ml  Net -5.89 ml  CVP 16 General: NAD Neck: JVP 14-16 cm, no thyromegaly or thyroid nodule.  Lungs: Clear to auscultation bilaterally with normal respiratory effort. CV: mildly tachy, lateral PMI.  Heart regular S1/S2, +S3, 2/6 HSM apex.  1+ ankle edema.   Abdomen: Soft, nontender, no hepatosplenomegaly, no distention.  Skin: Intact without lesions or rashes.  Neurologic: Alert and oriented x 3.  Psych: Normal affect. Extremities: No clubbing or cyanosis.  HEENT: Normal.    Telemetry:  Sinus Tach 110s (personally reviewed)  Labs: Basic Metabolic Panel: Recent Labs  Lab 11/19/20 0500 11/20/20 0540 11/22/20 0519 11/22/20 1043 11/23/20 0437 11/24/20 0516 11/25/20 0300  NA 135   < >  134* 132* 134* 133* 130*  K 3.8   < > 3.7 4.2 4.3 3.8 3.6  CL 100   < > 95* 93* 96* 93* 94*  CO2 27   < > '30 30 29 27 27  '$ GLUCOSE 168*   < > 128* 241* 159* 133* 177*  BUN 29*   < > 34* 38* 47* 54* 62*  CREATININE 1.52*   < > 1.95* 2.33* 3.23* 3.88* 4.05*  CALCIUM 8.8*   < > 8.7* 9.1 8.8* 8.7* 8.6*  MG 2.0  --   --   --   --   --   --    < > = values in this interval not displayed.    Liver Function Tests: No results for input(s): AST, ALT, ALKPHOS, BILITOT, PROT, ALBUMIN in the last 168 hours. No results for input(s): LIPASE, AMYLASE in the last 168 hours. No results for input(s): AMMONIA in the last 168 hours.  CBC: Recent Labs  Lab 11/19/20 0500 11/20/20 0540 11/21/20 0402 11/22/20 0519 11/23/20 0437  WBC 7.2 8.0 7.4 6.6 7.3  HGB 7.2* 8.3* 8.5* 8.1* 8.1*  HCT 23.6* 26.4* 26.9* 26.2* 25.9*  MCV 80.5 80.7 81.5 81.1 81.2  PLT 224 211 228 199 192    Cardiac Enzymes: No results for input(s): CKTOTAL, CKMB, CKMBINDEX, TROPONINI in the last 168 hours.  BNP: BNP (last 3 results) Recent Labs    11/11/20 2158  BNP 1,728.5*    ProBNP (last 3 results) Recent Labs    10/24/20 1119  PROBNP 1,685*      Other results:  Imaging: No results found.   Medications:     Scheduled Medications: . sodium chloride   Intravenous Once  . aspirin EC  81 mg Oral Daily  . atorvastatin  40 mg Oral Daily  . calcium carbonate  1 tablet Oral BID WC  . Chlorhexidine Gluconate Cloth  6 each Topical Daily  . enoxaparin (LOVENOX) injection  30 mg Subcutaneous Q24H  . ezetimibe  10 mg Oral Daily  . fluticasone  2 spray Each Nare BID  . insulin aspart  0-15 Units Subcutaneous TID AC & HS  . multivitamin with minerals  1 tablet Oral Daily  . omega-3 acid ethyl esters  1 g Oral Daily  . pantoprazole  40 mg Oral Daily  . polyethylene glycol  17 g Oral Daily  . sodium chloride flush  10-40 mL Intracatheter Q12H  . zolpidem  5 mg Oral Once    Infusions: . sodium chloride    .  DOBUTamine 3 mcg/kg/min (11/25/20 0320)  . furosemide Stopped (11/24/20 1719)    PRN Medications: sodium chloride, acetaminophen, calcium carbonate, guaiFENesin-dextromethorphan, ondansetron (ZOFRAN) IV, sodium chloride flush, sodium chloride flush   Assessment/Plan:   1. A/C Systolic Heart Failure,  ICM  - ECHO 2019 35-40%  - ECHO EF 20-25% RV normal Mod -Severe MR.  - RHC 11/16/20 - PCWP 25, RA 8, PA sat 46%   - BB and ARNI held due to hypotension.  - Intolerant SGT2i  with yeast infections.  - No digoxin with AKI   - Continue unna boots.  - cMRI 11/20/20 EF 22% with mild-mod RV dysfunction. Diffuse LGE suggestive of severe ischemic CM. Little viable myocardium - In looking at MRI, Suspect this is end-stage ischemic CM with low output. Cath w/ severe MVCAD but given limited viability on cMRI not sure revascularization will give him durable benefit. Ideally, would get 2v PCI and MitraClip at some point but with dramatic CIN likely not an option in the near future even if renal function recovers over the next week given high risk of recurrent CIN. LVAD not option right now with AKI.  - Increase dobutamine to 4 with co-ox 57%, watch HR.  - CVP rising, up to 16.  He was started on Lasix 120 mg IV bid yesterday pm with 1000 cc UOP charted overnight, will follow.   2. Chronic Anemia  - EGD 2022 - normal. No recent colonoscopy - FOBT negative - Received feraheme this admit.  - SPEP negative -  EPO 50  - 4/3 Given 1UPRBCs. Needs CBC today.  - On protonix.   3. CAD - Anterior MI 1997 PTCA LAD distal circ and 1st diagonal DES 2011 - LHC this admit w/ severe MVCAD  - There are targets for PCI.  However if PCI is chosen this would be a high risk procedure and likely would require hemodynamic support. Also w/ cMRI showing limited viability, questionable benefit to revascularization. Will need to d/w interventional team and CT surgery.  - continue ASA + statin + Zetia   4. AKI on CKD Stage  IIIb - Creatinine baseline 1.6-1.8 , creatinine 1.66>>1.95>>3.23>3.9>4.05. Suspect contrast nephropathy.  Continue DBA - Attempt at diuresis begun with rising CVP.  - No acute need for HD at this point but with rising creatinine will consult nephrology.  Suspect he would be a poor long-term HD candidate.  - Made 1000 urine last day.    5. Severe MR - this is functional.  LV not markedly dilated with LVEDD 5.8 cm, may be Mitraclip candidate but awaiting renal recovery.     Length of Stay: 14   Loralie Champagne, MD  8:27 AM

## 2020-11-25 NOTE — Progress Notes (Signed)
   11/25/20 2103  Assess: MEWS Score  Temp 97.8 F (36.6 C)  BP 103/73  Pulse Rate (!) 117  ECG Heart Rate (!) 117  Resp 18  SpO2 96 %  O2 Device Room Air  Assess: MEWS Score  MEWS Temp 0  MEWS Systolic 0  MEWS Pulse 2  MEWS RR 0  MEWS LOC 0  MEWS Score 2  MEWS Score Color Yellow  Assess: if the MEWS score is Yellow or Red  Were vital signs taken at a resting state? Yes  Focused Assessment No change from prior assessment  Early Detection of Sepsis Score *See Row Information* Low  MEWS guidelines implemented *See Row Information* No, previously yellow, continue vital signs every 4 hours  Document  Patient Outcome Stabilized after interventions  Progress note created (see row info) Yes

## 2020-11-26 DIAGNOSIS — I255 Ischemic cardiomyopathy: Secondary | ICD-10-CM | POA: Diagnosis not present

## 2020-11-26 LAB — COOXEMETRY PANEL
Carboxyhemoglobin: 1.7 % — ABNORMAL HIGH (ref 0.5–1.5)
Methemoglobin: 0.9 % (ref 0.0–1.5)
O2 Saturation: 56.1 %
Total hemoglobin: 7.6 g/dL — ABNORMAL LOW (ref 12.0–16.0)

## 2020-11-26 LAB — BASIC METABOLIC PANEL
Anion gap: 12 (ref 5–15)
BUN: 60 mg/dL — ABNORMAL HIGH (ref 8–23)
CO2: 27 mmol/L (ref 22–32)
Calcium: 8.7 mg/dL — ABNORMAL LOW (ref 8.9–10.3)
Chloride: 94 mmol/L — ABNORMAL LOW (ref 98–111)
Creatinine, Ser: 3.62 mg/dL — ABNORMAL HIGH (ref 0.61–1.24)
GFR, Estimated: 17 mL/min — ABNORMAL LOW (ref >60)
Glucose, Bld: 164 mg/dL — ABNORMAL HIGH (ref 70–99)
Potassium: 3.4 mmol/L — ABNORMAL LOW (ref 3.5–5.1)
Sodium: 133 mmol/L — ABNORMAL LOW (ref 135–145)

## 2020-11-26 LAB — GLUCOSE, CAPILLARY
Glucose-Capillary: 172 mg/dL — ABNORMAL HIGH (ref 70–99)
Glucose-Capillary: 314 mg/dL — ABNORMAL HIGH (ref 70–99)
Glucose-Capillary: 179 mg/dL — ABNORMAL HIGH (ref 70–99)
Glucose-Capillary: 193 mg/dL — ABNORMAL HIGH (ref 70–99)

## 2020-11-26 LAB — CBC
HCT: 24.2 % — ABNORMAL LOW (ref 39.0–52.0)
Hemoglobin: 7.7 g/dL — ABNORMAL LOW (ref 13.0–17.0)
MCH: 25.2 pg — ABNORMAL LOW (ref 26.0–34.0)
MCHC: 31.8 g/dL (ref 30.0–36.0)
MCV: 79.3 fL — ABNORMAL LOW (ref 80.0–100.0)
Platelets: 221 10*3/uL (ref 150–400)
RBC: 3.05 MIL/uL — ABNORMAL LOW (ref 4.22–5.81)
RDW: 18.4 % — ABNORMAL HIGH (ref 11.5–15.5)
WBC: 6.5 10*3/uL (ref 4.0–10.5)
nRBC: 0 % (ref 0.0–0.2)

## 2020-11-26 MED ORDER — POTASSIUM CHLORIDE CRYS ER 20 MEQ PO TBCR
40.0000 meq | EXTENDED_RELEASE_TABLET | Freq: Once | ORAL | Status: AC
  Administered 2020-11-26: 40 meq via ORAL
  Filled 2020-11-26: qty 2

## 2020-11-26 NOTE — Progress Notes (Signed)
   11/26/20 1132  Assess: MEWS Score  Temp (!) 97.4 F (36.3 C)  BP 97/67  Pulse Rate (!) 112  Resp 16  Level of Consciousness Alert  SpO2 97 %  O2 Device Room Air  Assess: MEWS Score  MEWS Temp 0  MEWS Systolic 1  MEWS Pulse 2  MEWS RR 0  MEWS LOC 0  MEWS Score 3  MEWS Score Color Yellow  Assess: if the MEWS score is Yellow or Red  Were vital signs taken at a resting state? Yes  Focused Assessment No change from prior assessment  Early Detection of Sepsis Score *See Row Information* Low  MEWS guidelines implemented *See Row Information* No, previously yellow, continue vital signs every 4 hours  Treat  Pain Scale 0-10  Pain Score 0  Document  Progress note created (see row info) Yes   Patient remains with elevated HR. Will continue scheduled medications

## 2020-11-26 NOTE — Progress Notes (Signed)
   11/26/20 2309  Assess: MEWS Score  Temp 98.2 F (36.8 C)  BP 97/72  Pulse Rate (!) 110  Resp 18  SpO2 100 %  O2 Device Nasal Cannula  O2 Flow Rate (L/min) 1 L/min  Assess: MEWS Score  MEWS Temp 0  MEWS Systolic 1  MEWS Pulse 1  MEWS RR 0  MEWS LOC 0  MEWS Score 2  MEWS Score Color Yellow  Assess: if the MEWS score is Yellow or Red  Were vital signs taken at a resting state? Yes  Focused Assessment No change from prior assessment  Early Detection of Sepsis Score *See Row Information* Low  MEWS guidelines implemented *See Row Information* No, previously yellow, continue vital signs every 4 hours  Document  Patient Outcome Stabilized after interventions  Progress note created (see row info) Yes

## 2020-11-26 NOTE — Progress Notes (Signed)
   11/25/20 2321  Assess: MEWS Score  Temp 98.6 F (37 C)  BP 95/66  Pulse Rate (!) 116  ECG Heart Rate (!) 116  Resp 18  SpO2 96 %  O2 Device Room Air  Assess: MEWS Score  MEWS Temp 0  MEWS Systolic 1  MEWS Pulse 2  MEWS RR 0  MEWS LOC 0  MEWS Score 3  MEWS Score Color Yellow  Assess: if the MEWS score is Yellow or Red  Were vital signs taken at a resting state? Yes  Focused Assessment No change from prior assessment  Early Detection of Sepsis Score *See Row Information* Low  MEWS guidelines implemented *See Row Information* No, previously yellow, continue vital signs every 4 hours  Document  Patient Outcome Stabilized after interventions  Progress note created (see row info) Yes

## 2020-11-26 NOTE — Progress Notes (Signed)
[facility] Progress Note  Subjective: creat down to 3.6 today. Good UOP yest. No new c/o's. No SOB.   Vitals:   11/25/20 2103 11/25/20 2321 11/26/20 0445 11/26/20 0800  BP: 1$Rem'03/73 95/66 97/64 'bYph$   Pulse: (!) 117 (!) 116 (!) 116   Resp: $Remo'18 18 18   'WpMmZ$ Temp: 97.8 F (36.6 C) 98.6 F (37 C) 98 F (36.7 C)   TempSrc: Axillary Oral Oral   SpO2: 96% 96% 96%   Weight:    74 kg  Height:        Exam:  alert, nad   no jvd  Chest cta bilat  Cor reg no RG  Abd soft ntnd no ascites   Ext bilat LE's wrapped, 1-2+ edema   Alert, NF, ox3     Home meds:  - metformin 1gm bid/ amaryl 4 qd  - lipitor 40 / coreg 12.5 bid/ entresto 49-- 51 bid/ zetia 10/ lasix 40 qd/ sl ntg prn/ plavix 75 qd  - protonix 40 bid  - prn's/ vitamins/ supplements    Date               Creat               eGFR   2011- 2020    1.28- 1.52   2021              1.48- 1.51   March 2022   1.64- 1.95   April 1- 4        1.48- 1.66        44- 51, IIIa   4/5                 1.66        4/7                 3.23   4/9                 4.05     Renal US - 10-11 cm kidneys w/o hydro, normal bladder    UA negative   UNa, 47, UCr 66       IV contrast 70 cc w/ LHC on 11/21/20     Net I/O as of 4/10 >>  net neg 10.6 L         Assessment/ Plan: 1. AKI on CKD 3a - b/l creatinine 1.4- 1.6, eGFR 44-51. AKI due to IV contrast (4/5) + systolic CHF. Creat peaked at 4.0 on 4/09, down to 3.6 today. Good UOP, getting IV lasix and dobutamine per CHF team. Renal US w/o obstruction. UA neg and urine lytes c/w ATN.  Appears to be in recovery phase. Continue supportive care. Will follow.  2. Chronic combined diast/ syst CHF - severe, LVEF 20-25% by ECHO.  Severe 3VCAD by cath, has targets for PCI but poor viability by cMRI. Started on IV dobutamine continuous on 3/31 here. If requires long-term dobutamine support, might not be accepted for outpatient dialysis. 3. DM2 4. H/o HTN - bp's soft here, not getting any BP lowering  medication 5. HL     Rob Shalaya Swailes 11/26/2020, 10:25 AM   Recent Labs  Lab 11/25/20 0300 11/25/20 1439 11/26/20 0600  K 3.6  --  3.4*  BUN 62*  --  60*  CREATININE 4.05*  --  3.62*  CALCIUM 8.6*  --  8.7*  HGB  --  7.7* 7.7*   Inpatient medications: . sodium chloride   Intravenous Once  .  aspirin EC  81 mg Oral Daily  . atorvastatin  40 mg Oral Daily  . calcium carbonate  1 tablet Oral BID WC  . Chlorhexidine Gluconate Cloth  6 each Topical Daily  . enoxaparin (LOVENOX) injection  30 mg Subcutaneous Q24H  . ezetimibe  10 mg Oral Daily  . fluticasone  2 spray Each Nare BID  . insulin aspart  0-15 Units Subcutaneous TID AC & HS  . multivitamin with minerals  1 tablet Oral Daily  . omega-3 acid ethyl esters  1 g Oral Daily  . pantoprazole  40 mg Oral Daily  . polyethylene glycol  17 g Oral Daily  . sodium chloride flush  10-40 mL Intracatheter Q12H  . zolpidem  5 mg Oral Once   . sodium chloride    . DOBUTamine 4 mcg/kg/min (11/25/20 0850)  . furosemide 120 mg (11/26/20 0844)   sodium chloride, acetaminophen, calcium carbonate, guaiFENesin-dextromethorphan, ondansetron (ZOFRAN) IV, sodium chloride flush, sodium chloride flush

## 2020-11-26 NOTE — Progress Notes (Signed)
   11/26/20 0445  Assess: MEWS Score  Temp 98 F (36.7 C)  BP 97/64  Pulse Rate (!) 116  Resp 18  SpO2 96 %  O2 Device Room Air  Assess: MEWS Score  MEWS Temp 0  MEWS Systolic 1  MEWS Pulse 2  MEWS RR 0  MEWS LOC 0  MEWS Score 3  MEWS Score Color Yellow  Assess: if the MEWS score is Yellow or Red  Were vital signs taken at a resting state? Yes  Focused Assessment No change from prior assessment  Early Detection of Sepsis Score *See Row Information* Low  MEWS guidelines implemented *See Row Information* No, previously yellow, continue vital signs every 4 hours  Document  Patient Outcome Stabilized after interventions  Progress note created (see row info) Yes

## 2020-11-26 NOTE — Progress Notes (Signed)
Patient ID: Marc [name], male   DOB: 1951-07-29, 70 y.o.   MRN: [id number]    Advanced Heart Failure Rounding Note   Subjective:    LHC 4/5 showed multivessel CAD. There are targets for PCI, though cMRI showed EF 22% limited viability.  Co-ox 56% this morning, currently on dobutamine 4 with SBP 90s and HR around 110-120. CVP down to 9-10 this morning on Lasix 120 mg IV bid yesterday. Weight down 1 pounds  Bump in SCr post cath, 1.66>>1.95>>3.23>>3.88>>4.05>> 3.62   Objective:   Weight Range:  Vital Signs:   Temp:  [97.4 F (36.3 C)-98.6 F (37 C)] 97.4 F (36.3 C) (04/10 1132) Pulse Rate:  [112-117] 112 (04/10 1132) Resp:  [16-18] 16 (04/10 1132) BP: (93-103)/(62-73) 97/67 (04/10 1132) SpO2:  [96 %-100 %] 97 % (04/10 1132) Weight:  [74 kg] 74 kg (04/10 0800) Last BM Date: 11/25/20  Weight change: Filed Weights   11/24/20 0500 11/25/20 0107 11/26/20 0800  Weight: 74.4 kg 74.7 kg 74 kg    Intake/Output:   Intake/Output Summary (Last 24 hours) at 11/26/2020 1250 Last data filed at 11/26/2020 1200 Gross per 24 hour  Intake 1264.44 ml  Output 1696 ml  Net -431.56 ml  General:  Sitting up No resp difficulty HEENT: normal Neck: supple. JVP 9-10. Carotids 2+ bilat; no bruits. No lymphadenopathy or thryomegaly appreciated. Cor: PMI nondisplaced. Tachy regular 3/6 MR Lungs: clear Abdomen: soft, nontender, nondistended. No hepatosplenomegaly. No bruits or masses. Good bowel sounds. Extremities: no cyanosis, clubbing, rash, 1+ edema + UNNA Neuro: alert & orientedx3, cranial nerves grossly intact. moves all 4 extremities w/o difficulty. Affect pleasant  Telemetry:  Sinus Tach 110s (personally reviewed)  Labs: Basic Metabolic Panel: Recent Labs  Lab 11/22/20 1043 11/23/20 0437 11/24/20 0516 11/25/20 0300 11/26/20 0600  NA 132* 134* 133* 130* 133*  K 4.2 4.3 3.8 3.6 3.4*  CL 93* 96* 93* 94* 94*  CO2 '30 29 27 27 27  '$ GLUCOSE 241* 159* 133* 177* 164*  BUN 38*  47* 54* 62* 60*  CREATININE 2.33* 3.23* 3.88* 4.05* 3.62*  CALCIUM 9.1 8.8* 8.7* 8.6* 8.7*    Liver Function Tests: No results for input(s): AST, ALT, ALKPHOS, BILITOT, PROT, ALBUMIN in the last 168 hours. No results for input(s): LIPASE, AMYLASE in the last 168 hours. No results for input(s): AMMONIA in the last 168 hours.  CBC: Recent Labs  Lab 11/21/20 0402 11/22/20 0519 11/23/20 0437 11/25/20 1439 11/26/20 0600  WBC 7.4 6.6 7.3 6.3 6.5  HGB 8.5* 8.1* 8.1* 7.7* 7.7*  HCT 26.9* 26.2* 25.9* 24.7* 24.2*  MCV 81.5 81.1 81.2 79.7* 79.3*  PLT 228 199 192 215 221    Cardiac Enzymes: No results for input(s): CKTOTAL, CKMB, CKMBINDEX, TROPONINI in the last 168 hours.  BNP: BNP (last 3 results) Recent Labs    11/11/20 2158  BNP 1,728.5*    ProBNP (last 3 results) Recent Labs    10/24/20 1119  PROBNP 1,685*      Other results:  Imaging: No results found.   Medications:     Scheduled Medications: . sodium chloride   Intravenous Once  . aspirin EC  81 mg Oral Daily  . atorvastatin  40 mg Oral Daily  . calcium carbonate  1 tablet Oral BID WC  . Chlorhexidine Gluconate Cloth  6 each Topical Daily  . enoxaparin (LOVENOX) injection  30 mg Subcutaneous Q24H  . ezetimibe  10 mg Oral Daily  . fluticasone  2 spray Each  Nare BID  . insulin aspart  0-15 Units Subcutaneous TID AC & HS  . multivitamin with minerals  1 tablet Oral Daily  . omega-3 acid ethyl esters  1 g Oral Daily  . pantoprazole  40 mg Oral Daily  . polyethylene glycol  17 g Oral Daily  . sodium chloride flush  10-40 mL Intracatheter Q12H  . zolpidem  5 mg Oral Once    Infusions: . sodium chloride    . DOBUTamine 4 mcg/kg/min (11/25/20 0850)  . furosemide 120 mg (11/26/20 0844)    PRN Medications: sodium chloride, acetaminophen, calcium carbonate, guaiFENesin-dextromethorphan, ondansetron (ZOFRAN) IV, sodium chloride flush, sodium chloride flush   Assessment/Plan:   1. A/C Systolic Heart  Failure,  ICM  - ECHO 2019 35-40%  - ECHO EF 20-25% RV normal Mod -Severe MR.  - RHC 11/16/20 - PCWP 25, RA 8, PA sat 46%   - BB and ARNI held due to hypotension.  - Intolerant SGT2i with yeast infections.  - No digoxin with AKI   - Continue unna boots.  - cMRI 11/20/20 EF 22% with mild-mod RV dysfunction. Diffuse LGE suggestive of severe ischemic CM. Little viable myocardium - In looking at MRI, Suspect this is end-stage ischemic CM with low output. Cath w/ severe MVCAD but given limited viability on cMRI not sure revascularization will give him durable benefit. Ideally, would get 2v PCI and MitraClip at some point but with dramatic CIN likely not an option in the near future even if renal function recovers over the next week given high risk of recurrent CIN. LVAD not option right now with AKI.  - Now on dobutamine to 4 with co-ox 56%, watch HR.  - Renal function improving. Diuresing with lasix 120 IB bid. CVP 9-10. Continue IV lasix  2. Chronic Anemia, iron deficient  - EGD 2022 - normal. No recent colonoscopy - FOBT negative - Received feraheme this admit.  - SPEP negative -  EPO 50  - 4/3 Given 1UPRBCs. - Hgb drifting down again. 7.7 today. Will repeat T&S in am tomorrow and give another unit RBCs tomorrow - On protonix.  - Will likely need colonoscopy eventually   3. CAD - Anterior MI 1997 PTCA LAD distal circ and 1st diagonal DES 2011 - LHC this admit w/ severe MVCAD  - There are targets for PCI in OM and RCA but risk of recurrent CIN very high. Will defer for now. May be better to let kidneys rest for several weeks and re-evaluating - No current angina - continue ASA + statin + Zetia   4. AKI on CKD Stage IIIb - Creatinine baseline 1.6-1.8 , creatinine 1.66>>1.95>>3.23>3.9>4.05 -> 3.6. Suspect contrast nephropathy.  Continue DBA - Diuresing with IV lasix - Renal function starting to improve.  - No acute need for HD. Appreciate Renal input.   5. Severe MR - this is  functional.  LV not markedly dilated with LVEDD 5.8 cm, may be Mitraclip candidate but awaiting renal recovery.     Length of Stay: 15   Glori Bickers, MD  12:50 PM

## 2020-11-26 NOTE — Progress Notes (Signed)
   11/26/20 2100  Assess: MEWS Score  Temp 98 F (36.7 C)  BP 99/65  Pulse Rate (!) 114  Resp 16  SpO2 96 %  O2 Device Room Air  Assess: MEWS Score  MEWS Temp 0  MEWS Systolic 1  MEWS Pulse 2  MEWS RR 0  MEWS LOC 0  MEWS Score 3  MEWS Score Color Yellow  Assess: if the MEWS score is Yellow or Red  Were vital signs taken at a resting state? Yes  Focused Assessment No change from prior assessment  Early Detection of Sepsis Score *See Row Information* Low  MEWS guidelines implemented *See Row Information* No, previously yellow, continue vital signs every 4 hours  Document  Patient Outcome Stabilized after interventions  Progress note created (see row info) Yes

## 2020-11-27 ENCOUNTER — Inpatient Hospital Stay (HOSPITAL_COMMUNITY): Payer: HMO

## 2020-11-27 DIAGNOSIS — I255 Ischemic cardiomyopathy: Secondary | ICD-10-CM | POA: Diagnosis not present

## 2020-11-27 LAB — BASIC METABOLIC PANEL
Anion gap: 11 (ref 5–15)
BUN: 58 mg/dL — ABNORMAL HIGH (ref 8–23)
CO2: 28 mmol/L (ref 22–32)
Calcium: 8.8 mg/dL — ABNORMAL LOW (ref 8.9–10.3)
Chloride: 93 mmol/L — ABNORMAL LOW (ref 98–111)
Creatinine, Ser: 3.31 mg/dL — ABNORMAL HIGH (ref 0.61–1.24)
GFR, Estimated: 19 mL/min — ABNORMAL LOW (ref >60)
Glucose, Bld: 229 mg/dL — ABNORMAL HIGH (ref 70–99)
Potassium: 3.7 mmol/L (ref 3.5–5.1)
Sodium: 132 mmol/L — ABNORMAL LOW (ref 135–145)

## 2020-11-27 LAB — HEMOGLOBIN AND HEMATOCRIT, BLOOD
HCT: 26.8 % — ABNORMAL LOW (ref 39.0–52.0)
Hemoglobin: 8.7 g/dL — ABNORMAL LOW (ref 13.0–17.0)

## 2020-11-27 LAB — PREPARE RBC (CROSSMATCH)

## 2020-11-27 LAB — CBC
HCT: 23.5 % — ABNORMAL LOW (ref 39.0–52.0)
Hemoglobin: 7.3 g/dL — ABNORMAL LOW (ref 13.0–17.0)
MCH: 25 pg — ABNORMAL LOW (ref 26.0–34.0)
MCHC: 31.1 g/dL (ref 30.0–36.0)
MCV: 80.5 fL (ref 80.0–100.0)
Platelets: 241 10*3/uL (ref 150–400)
RBC: 2.92 MIL/uL — ABNORMAL LOW (ref 4.22–5.81)
RDW: 18.4 % — ABNORMAL HIGH (ref 11.5–15.5)
WBC: 5.8 10*3/uL (ref 4.0–10.5)
nRBC: 0 % (ref 0.0–0.2)

## 2020-11-27 LAB — GLUCOSE, CAPILLARY
Glucose-Capillary: 144 mg/dL — ABNORMAL HIGH (ref 70–99)
Glucose-Capillary: 246 mg/dL — ABNORMAL HIGH (ref 70–99)
Glucose-Capillary: 220 mg/dL — ABNORMAL HIGH (ref 70–99)
Glucose-Capillary: 173 mg/dL — ABNORMAL HIGH (ref 70–99)

## 2020-11-27 LAB — COOXEMETRY PANEL
Carboxyhemoglobin: 1.4 % (ref 0.5–1.5)
Methemoglobin: 0.6 % (ref 0.0–1.5)
O2 Saturation: 52 %
Total hemoglobin: 7.5 g/dL — ABNORMAL LOW (ref 12.0–16.0)

## 2020-11-27 MED ORDER — SODIUM CHLORIDE 0.9% IV SOLUTION: Freq: Once | INTRAVENOUS | Status: AC

## 2020-11-27 MED ORDER — IOHEXOL 9 MG/ML PO SOLN
500.0000 mL | ORAL | Status: AC
  Administered 2020-11-27 (×2): 500 mL via ORAL

## 2020-11-27 NOTE — Progress Notes (Signed)
[facility] Progress Note   Home meds: - metformin 1gm bid/ amaryl 4 qd - lipitor 40 / coreg 12.5 bid/ entresto 49-- 51 bid/ zetia 10/ lasix 40 qd/ sl ntg prn/ plavix 75 qd - protonix 40 bid - prn's/ vitamins/ supplements  Assessment/ Plan:   1. AKI on CKD 3a - b/l creatinine 1.4- 1.6, eGFR 44-51. AKI due to IV contrast (4/5) + systolic CHF. Creat peaked at 4.0 on 4/09, down to 3.3 today. Good UOP, had been getting Lasix (last dose 4/10) + dobutamine per CHF team; agree with  holding the Lasix for today given lower CVP. He's also breathing comfortably in NAD and  appears to be in the recovery phase of ATN. Renal US w/o obstruction. UA neg and urine lytes c/w ATN. Hard to determine if he was self diuresing with recovery phase of ATN vs from the Lasix but we will find out tomorrow.   2. Chronic combined diast/ syst CHF - severe, LVEF 20-25% by ECHO. Severe 3VCAD by cath, has targets for PCI but poor viability by cMRI. Started on IV dobutamine continuous on 3/31 here. If requires long-term dobutamine support, might not be accepted for outpatient dialysis. 3. Anemia - being transfused 2nd unit today. 4. 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 5. DM2 6. H/o HTN - bp's soft here, not getting any BP lowering medication 7. HL   Subjective:   Feeling better. Denies f/c/n/v/ black stool or BRBPR, hemoptysis. Has fatigue but  Denies dyspnea.   Objective:   BP 107/76   Pulse (!) 105   Temp 97.7 F (36.5 C)   Resp 20   Ht _0  (1.702 m)   Wt 74.6 kg   SpO2 100%   BMI 25.75 kg/m   Intake/Output Summary (Last 24 hours) at 11/27/2020 1123 Last data filed at 11/27/2020 1109 Gross per 24 hour  Intake 1817.05 ml  Output 1760 ml  Net 57.05 ml   Weight change:   Physical Exam:  alert, nad   no jvd  Chest cta bilat  Cor reg no RG  Abd soft ntnd no ascites   Ext bilat LE's wrapped, 1-2+ edema   Alert, NF, ox3  Imaging: No results found.  Labs: BMET Recent Labs  Lab  11/22/20 0519 11/22/20 1043 11/23/20 0437 11/24/20 0516 11/25/20 0300 11/26/20 0600 11/27/20 0500  NA 134* 132* 134* 133* 130* 133* 132*  K 3.7 4.2 4.3 3.8 3.6 3.4* 3.7  CL 95* 93* 96* 93* 94* 94* 93*  CO2 _1 GLUCOSE 128* 241* 159* 133* 177* 164* 229*  BUN 34* 38* 47* 54* 62* 60* 58*  CREATININE 1.95* 2.33* 3.23* 3.88* 4.05* 3.62* 3.31*  CALCIUM 8.7* 9.1 8.8* 8.7* 8.6* 8.7* 8.8*   CBC Recent Labs  Lab 11/23/20 0437 11/25/20 1439 11/26/20 0600 11/27/20 0500  WBC 7.3 6.3 6.5 5.8  HGB 8.1* 7.7* 7.7* 7.3*  HCT 25.9* 24.7* 24.2* 23.5*  MCV 81.2 79.7* 79.3* 80.5  PLT 192 215 221 241    Medications:    . sodium chloride   Intravenous Once  . aspirin EC  81 mg Oral Daily  . atorvastatin  40 mg Oral Daily  . calcium carbonate  1 tablet Oral BID WC  . Chlorhexidine Gluconate Cloth  6 each Topical Daily  . enoxaparin (LOVENOX) injection  30 mg Subcutaneous Q24H  . ezetimibe  10 mg Oral Daily  . fluticasone  2 spray Each Nare BID  . insulin  aspart  0-15 Units Subcutaneous TID AC & HS  . multivitamin with minerals  1 tablet Oral Daily  . omega-3 acid ethyl esters  1 g Oral Daily  . pantoprazole  40 mg Oral Daily  . polyethylene glycol  17 g Oral Daily  . sodium chloride flush  10-40 mL Intracatheter Q12H  . zolpidem  5 mg Oral Once      Otelia Santee, MD 11/27/2020, 11:23 AM

## 2020-11-27 NOTE — Progress Notes (Signed)
Orthopedic Tech Progress Note Patient Details:  Marc [name] [birth year] [id number]  Ortho Devices Type of Ortho Device: Louretta Parma boot Ortho Device/Splint Location: BLE Ortho Device/Splint Interventions: Ordered,Application,Adjustment   Post Interventions Patient Tolerated: Well Instructions Provided: Care of device   [name] 11/27/2020, 3:18 PM

## 2020-11-27 NOTE — Progress Notes (Signed)
Inpatient Diabetes Program Recommendations  AACE/ADA: New Consensus Statement on Inpatient Glycemic Control (2015)  Target Ranges:  Prepandial:   less than 140 mg/dL      Peak postprandial:   less than 180 mg/dL (1-2 hours)      Critically ill patients:  140 - 180 mg/dL   Lab Results  Component Value Date   GLUCAP 246 (H) 11/27/2020   HGBA1C 5.7 (H) 11/12/2020    Review of Glycemic Control Results for Marc [name], Marc [name] "[name]" (MRN [id number]) as of 11/27/2020 12:10  Ref. Range 11/26/2020 06:28 11/26/2020 11:34 11/26/2020 15:36 11/26/2020 20:59 11/27/2020 06:07 11/27/2020 11:07  Glucose-Capillary Latest Ref Range: 70 - 99 mg/dL 172 (H) 314 (H) 179 (H) 193 (H) 144 (H) 246 (H)   Diabetes history: DM2  Current orders for Inpatient glycemic control: Novolog 0-15 units TID and 0-5 units QHS  Inpatient Diabetes Program Recommendations:    Please consider Novolog 3 units TID with meals if eats at least 50%.    Will continue to follow while inpatient.  Thank you, Reche Dixon, RN, BSN Diabetes Coordinator Inpatient Diabetes Program [phone] (team pager from 8a-5p)

## 2020-11-27 NOTE — Care Management Important Message (Signed)
Important Message  Patient Details  Name: Marc [name] MRN: [id number] Date of Birth: 06/02/1951   Medicare Important Message Given:  Yes     [name] 11/27/2020, 8:56 AM

## 2020-11-27 NOTE — Progress Notes (Signed)
Patient ID: Marc [name], male   DOB: May 12, 1951, 70 y.o.   MRN: [id number]    Advanced Heart Failure Rounding Note   Subjective:    LHC 4/5 showed multivessel CAD. There are targets for PCI, though cMRI showed EF 22% limited viability.  Co-ox 52% on DBA 4 with SBP 90-105. Remains on IV lasix. Weight down 1.5 pounds.   Denies CP, SOB, orthopnea or PND. Hgb 7.3.   Bump in SCr post cath, 1.66>>1.95>>3.23>>3.88>>4.05>> 3.62 > 3.31   Objective:   Weight Range:  Vital Signs:   Temp:  [97.4 F (36.3 C)-98.2 F (36.8 C)] 98.2 F (36.8 C) (04/10 2309) Pulse Rate:  [109-114] 109 (04/11 0800) Resp:  [16-22] 22 (04/11 0800) BP: (91-105)/(65-74) 102/74 (04/11 0800) SpO2:  [95 %-100 %] 95 % (04/11 0800) Weight:  [74.6 kg] 74.6 kg (04/11 0500) Last BM Date: 11/26/20  Weight change: Filed Weights   11/26/20 0800 11/27/20 0026 11/27/20 0500  Weight: 74 kg 74.6 kg 74.6 kg    Intake/Output:   Intake/Output Summary (Last 24 hours) at 11/27/2020 0841 Last data filed at 11/27/2020 0700 Gross per 24 hour  Intake 1732.05 ml  Output 1960 ml  Net -227.95 ml    General:  Sitting up in chair No resp difficulty HEENT: normal Neck: supple. JVP 8-9 Carotids 2+ bilat; no bruits. No lymphadenopathy or thryomegaly appreciated. Cor: PMI nondisplaced. Regular tachy 3/6 MR Lungs: clear Abdomen: soft, nontender, nondistended. No hepatosplenomegaly. No bruits or masses. Good bowel sounds. Extremities: no cyanosis, clubbing, rash, trace edema + UNNA Neuro: alert & orientedx3, cranial nerves grossly intact. moves all 4 extremities w/o difficulty. Affect pleasant  Telemetry:  Sinus Tach 100-110s Personally reviewed  Labs: Basic Metabolic Panel: Recent Labs  Lab 11/23/20 0437 11/24/20 0516 11/25/20 0300 11/26/20 0600 11/27/20 0500  NA 134* 133* 130* 133* 132*  K 4.3 3.8 3.6 3.4* 3.7  CL 96* 93* 94* 94* 93*  CO2 '29 27 27 27 28  '$ GLUCOSE 159* 133* 177* 164* 229*  BUN 47* 54* 62* 60*  58*  CREATININE 3.23* 3.88* 4.05* 3.62* 3.31*  CALCIUM 8.8* 8.7* 8.6* 8.7* 8.8*    Liver Function Tests: No results for input(s): AST, ALT, ALKPHOS, BILITOT, PROT, ALBUMIN in the last 168 hours. No results for input(s): LIPASE, AMYLASE in the last 168 hours. No results for input(s): AMMONIA in the last 168 hours.  CBC: Recent Labs  Lab 11/22/20 0519 11/23/20 0437 11/25/20 1439 11/26/20 0600 11/27/20 0500  WBC 6.6 7.3 6.3 6.5 5.8  HGB 8.1* 8.1* 7.7* 7.7* 7.3*  HCT 26.2* 25.9* 24.7* 24.2* 23.5*  MCV 81.1 81.2 79.7* 79.3* 80.5  PLT 199 192 215 221 241    Cardiac Enzymes: No results for input(s): CKTOTAL, CKMB, CKMBINDEX, TROPONINI in the last 168 hours.  BNP: BNP (last 3 results) Recent Labs    11/11/20 2158  BNP 1,728.5*    ProBNP (last 3 results) Recent Labs    10/24/20 1119  PROBNP 1,685*      Other results:  Imaging: No results found.   Medications:     Scheduled Medications: . sodium chloride   Intravenous Once  . aspirin EC  81 mg Oral Daily  . atorvastatin  40 mg Oral Daily  . calcium carbonate  1 tablet Oral BID WC  . Chlorhexidine Gluconate Cloth  6 each Topical Daily  . enoxaparin (LOVENOX) injection  30 mg Subcutaneous Q24H  . ezetimibe  10 mg Oral Daily  . fluticasone  2 spray  Each Nare BID  . insulin aspart  0-15 Units Subcutaneous TID AC & HS  . multivitamin with minerals  1 tablet Oral Daily  . omega-3 acid ethyl esters  1 g Oral Daily  . pantoprazole  40 mg Oral Daily  . polyethylene glycol  17 g Oral Daily  . sodium chloride flush  10-40 mL Intracatheter Q12H  . zolpidem  5 mg Oral Once    Infusions: . sodium chloride    . DOBUTamine 4 mcg/kg/min (11/26/20 1730)  . furosemide 120 mg (11/26/20 1733)    PRN Medications: sodium chloride, acetaminophen, calcium carbonate, guaiFENesin-dextromethorphan, ondansetron (ZOFRAN) IV, sodium chloride flush, sodium chloride flush   Assessment/Plan:   1. A/C Systolic Heart Failure,   ICM  - ECHO 2019 35-40%  - ECHO EF 20-25% RV normal Mod -Severe MR.  - RHC 11/16/20 - PCWP 25, RA 8, PA sat 46%   - BB and ARNI held due to hypotension.  - Intolerant SGT2i with yeast infections.  - No digoxin with AKI   - cMRI 11/20/20 EF 22% with mild-mod RV dysfunction. Diffuse LGE suggestive of severe ischemic CM. Little viable myocardium - In looking at MRI, Suspect this is end-stage ischemic CM with low output. Cath w/ severe MVCAD but given limited viability on cMRI not sure revascularization will give him durable benefit. Ideally, would get 2v PCI and MitraClip at some point but with dramatic CIN likely not an option in the near future even if renal function recovers over the next week given high risk of recurrent CIN. LVAD not option right now with AKI.  - Now on dobutamine to 4 with co-ox 52%.  - Renal function improving. Diuresing with lasix 120 IB bid. CVP down. Will stop IV lasix today - Continue unna boots.   2. Chronic Anemia, iron deficient  - EGD 2022 - normal. No recent colonoscopy - FOBT negative - Received feraheme this admit.  - SPEP negative -  EPO 50  - 4/3 Given 1UPRBCs. - Hgb drifting down again. 7.3 today. Will give another unit of blood - On protonix.  - Will likely need colonoscopy eventually but may be too tenuous. May be worth considering CT C/A/P without contrast to further risk stratify.   3. CAD - Anterior MI 1997 PTCA LAD distal circ and 1st diagonal DES 2011 - LHC this admit w/ severe MVCAD  - There are targets for PCI in OM and RCA but risk of recurrent CIN very high. Will defer for now. May be better to let kidneys rest for several weeks and re-evaluating - No current angina - continue ASA + statin + Zetia   4. AKI on CKD Stage IIIb - Creatinine baseline 1.6-1.8 , creatinine 1.66>>1.95>>3.23>3.9>4.05 -> 3.6 -> 3.3. Suspect contrast nephropathy.  Continue DBA - Diuresing with IV lasix. Will hold lasix today - Renal function continues to improve. Will  await to see new baseline. Have decided to avoid repeat contrast exposure for at least a few weeks given severity of AKI with diagnostic cath only - Appreciate Renal input.   5. Severe MR - this is functional.  LV not markedly dilated with LVEDD 5.8 cm, may be Mitraclip candidate but awaiting renal recovery.   Overall continues to struggle. He has low cardiac output with multi-vessel CAD and severe MR. Now inotrope dependent. Also with persistent anemia and severe AKI. I am not sure that we will have much to offer him that will really improve his situation at this point. Continue to  await recovery of renal function but may need to consider Palliative Consult. D/w Dr. Burt Knack.     Length of Stay: 16   Glori Bickers, MD  8:41 AM

## 2020-11-28 DIAGNOSIS — I340 Nonrheumatic mitral (valve) insufficiency: Secondary | ICD-10-CM | POA: Diagnosis not present

## 2020-11-28 LAB — BASIC METABOLIC PANEL
Anion gap: 8 (ref 5–15)
BUN: 56 mg/dL — ABNORMAL HIGH (ref 8–23)
CO2: 27 mmol/L (ref 22–32)
Calcium: 8.6 mg/dL — ABNORMAL LOW (ref 8.9–10.3)
Chloride: 97 mmol/L — ABNORMAL LOW (ref 98–111)
Creatinine, Ser: 2.85 mg/dL — ABNORMAL HIGH (ref 0.61–1.24)
GFR, Estimated: 23 mL/min — ABNORMAL LOW (ref >60)
Glucose, Bld: 130 mg/dL — ABNORMAL HIGH (ref 70–99)
Potassium: 3.8 mmol/L (ref 3.5–5.1)
Sodium: 132 mmol/L — ABNORMAL LOW (ref 135–145)

## 2020-11-28 LAB — COOXEMETRY PANEL
Carboxyhemoglobin: 2 % — ABNORMAL HIGH (ref 0.5–1.5)
Methemoglobin: 0.7 % (ref 0.0–1.5)
O2 Saturation: 56 %
Total hemoglobin: 9 g/dL — ABNORMAL LOW (ref 12.0–16.0)

## 2020-11-28 LAB — CBC
HCT: 27.4 % — ABNORMAL LOW (ref 39.0–52.0)
Hemoglobin: 8.7 g/dL — ABNORMAL LOW (ref 13.0–17.0)
MCH: 25.6 pg — ABNORMAL LOW (ref 26.0–34.0)
MCHC: 31.8 g/dL (ref 30.0–36.0)
MCV: 80.6 fL (ref 80.0–100.0)
Platelets: 265 10*3/uL (ref 150–400)
RBC: 3.4 MIL/uL — ABNORMAL LOW (ref 4.22–5.81)
RDW: 18 % — ABNORMAL HIGH (ref 11.5–15.5)
WBC: 7.3 10*3/uL (ref 4.0–10.5)
nRBC: 0 % (ref 0.0–0.2)

## 2020-11-28 LAB — BPAM RBC
Blood Product Expiration Date: 202204232359
ISSUE DATE / TIME: 202204111057
Unit Type and Rh: 8400

## 2020-11-28 LAB — GLUCOSE, CAPILLARY
Glucose-Capillary: 134 mg/dL — ABNORMAL HIGH (ref 70–99)
Glucose-Capillary: 216 mg/dL — ABNORMAL HIGH (ref 70–99)
Glucose-Capillary: 189 mg/dL — ABNORMAL HIGH (ref 70–99)
Glucose-Capillary: 229 mg/dL — ABNORMAL HIGH (ref 70–99)

## 2020-11-28 LAB — TYPE AND SCREEN
ABO/RH(D): AB POS
Antibody Screen: NEGATIVE
Unit division: 0

## 2020-11-28 LAB — MAGNESIUM: Magnesium: 1.8 mg/dL (ref 1.7–2.4)

## 2020-11-28 MED ORDER — MAGNESIUM SULFATE 2 GM/50ML IV SOLN
2.0000 g | Freq: Once | INTRAVENOUS | Status: AC
  Administered 2020-11-28: 2 g via INTRAVENOUS
  Filled 2020-11-28: qty 50

## 2020-11-28 MED ORDER — FUROSEMIDE 10 MG/ML IJ SOLN
120.0000 mg | Freq: Two times a day (BID) | INTRAVENOUS | Status: AC
  Administered 2020-11-28 (×2): 120 mg via INTRAVENOUS
  Filled 2020-11-28 (×2): qty 10

## 2020-11-28 MED ORDER — POTASSIUM CHLORIDE CRYS ER 20 MEQ PO TBCR
40.0000 meq | EXTENDED_RELEASE_TABLET | Freq: Once | ORAL | Status: AC
  Administered 2020-11-28: 40 meq via ORAL
  Filled 2020-11-28: qty 2

## 2020-11-28 NOTE — Progress Notes (Signed)
Progress Note  Patient Name: Marc [name] Date of Encounter: 11/28/2020  [facility] Cardiologist: Sinclair Grooms, MD   Subjective   Doing ok. Shortness of breath unchanged. No chest pain. Notes that he's making a lot of urine.   Inpatient Medications    Scheduled Meds: . sodium chloride   Intravenous Once  . aspirin EC  81 mg Oral Daily  . atorvastatin  40 mg Oral Daily  . calcium carbonate  1 tablet Oral BID WC  . Chlorhexidine Gluconate Cloth  6 each Topical Daily  . enoxaparin (LOVENOX) injection  30 mg Subcutaneous Q24H  . ezetimibe  10 mg Oral Daily  . fluticasone  2 spray Each Nare BID  . insulin aspart  0-15 Units Subcutaneous TID AC & HS  . multivitamin with minerals  1 tablet Oral Daily  . omega-3 acid ethyl esters  1 g Oral Daily  . pantoprazole  40 mg Oral Daily  . polyethylene glycol  17 g Oral Daily  . sodium chloride flush  10-40 mL Intracatheter Q12H  . zolpidem  5 mg Oral Once   Continuous Infusions: . sodium chloride    . DOBUTamine 4 mcg/kg/min (11/26/20 1730)  . furosemide 120 mg (11/28/20 0916)  . magnesium sulfate bolus IVPB     PRN Meds: sodium chloride, acetaminophen, calcium carbonate, guaiFENesin-dextromethorphan, ondansetron (ZOFRAN) IV, sodium chloride flush, sodium chloride flush   Vital Signs    Vitals:   11/28/20 0811 11/28/20 0811 11/28/20 0900 11/28/20 1118  BP: 109/76 95/69 109/76 109/78  Pulse: (!) 109 (!) 109 (!) 109 (!) 109  Resp: (!) 23 17 (!) 23 16  Temp: 97.8 F (36.6 C) 97.9 F (36.6 C) 97.8 F (36.6 C) 98.3 F (36.8 C)  TempSrc:  Oral  Oral  SpO2:  96% 95% 100%  Weight:      Height:        Intake/Output Summary (Last 24 hours) at 11/28/2020 1421 Last data filed at 11/28/2020 1301 Gross per 24 hour  Intake 1457.62 ml  Output 1975 ml  Net -517.38 ml   Last 3 Weights 11/28/2020 11/27/2020 11/27/2020  Weight (lbs) 165 lb 2 oz 164 lb 6.4 oz 164 lb 6.4 oz  Weight (kg) 74.9 kg 74.571 kg 74.571 kg       Telemetry    Sinus tachycardia - Personally Reviewed   Physical Exam  Alert oriented in NAD GEN: No acute distress.   Neck: JVD present Cardiac: tachy and regular, 2/6 harsh systolic murmur at the left axilla Respiratory: Clear to auscultation bilaterally. GI: Soft, nontender, non-distended  MS: 1+ edema; No deformity. Neuro:  Nonfocal  Psych: Normal affect   Labs    High Sensitivity Troponin:  No results for input(s): TROPONINIHS in the last 720 hours.    Chemistry Recent Labs  Lab 11/26/20 0600 11/27/20 0500 11/28/20 0532  NA 133* 132* 132*  K 3.4* 3.7 3.8  CL 94* 93* 97*  CO2 '27 28 27  '$ GLUCOSE 164* 229* 130*  BUN 60* 58* 56*  CREATININE 3.62* 3.31* 2.85*  CALCIUM 8.7* 8.8* 8.6*  GFRNONAA 17* 19* 23*  ANIONGAP '12 11 8     '$ Hematology Recent Labs  Lab 11/26/20 0600 11/27/20 0500 11/27/20 1900 11/28/20 0532  WBC 6.5 5.8  --  7.3  RBC 3.05* 2.92*  --  3.40*  HGB 7.7* 7.3* 8.7* 8.7*  HCT 24.2* 23.5* 26.8* 27.4*  MCV 79.3* 80.5  --  80.6  MCH 25.2* 25.0*  --  25.6*  MCHC 31.8 31.1  --  31.8  RDW 18.4* 18.4*  --  18.0*  PLT 221 241  --  265    BNPNo results for input(s): BNP, PROBNP in the last 168 hours.   DDimer No results for input(s): DDIMER in the last 168 hours.   Radiology    CT CHEST ABDOMEN PELVIS WO CONTRAST  Result Date: 11/27/2020 CLINICAL DATA:  Iron deficiency anemia, weight loss EXAM: CT CHEST, ABDOMEN AND PELVIS WITHOUT CONTRAST TECHNIQUE: Multidetector CT imaging of the chest, abdomen and pelvis was performed following the standard protocol without IV contrast. COMPARISON:  11/11/2020, 11/22/2020 FINDINGS: CT CHEST FINDINGS Cardiovascular: Mild cardiomegaly without pericardial effusion. Normal caliber of the thoracic aorta. Aberrant origin of the right subclavian artery is seen, a frequent anatomic variant. There is extensive atherosclerosis of the coronary vasculature, with minimal atherosclerosis of the right subclavian artery.  Right-sided PICC line tip within the superior vena cava. Mediastinum/Nodes: No enlarged mediastinal, hilar, or axillary lymph nodes. Thyroid gland, trachea, and esophagus demonstrate no significant findings. Lungs/Pleura: Small bilateral pleural effusions volume estimated less than 1 L each. Patchy areas of airspace disease are seen bilaterally, which could reflect multifocal infection or edema. No pneumothorax. Central airways are patent. Musculoskeletal: No acute or destructive bony lesions. Reconstructed images demonstrate no additional findings. CT ABDOMEN PELVIS FINDINGS Hepatobiliary: High attenuation material dependently within the gallbladder could reflect vicarious excretion of previously administered contrast versus sludge. No evidence of cholecystitis. Unenhanced imaging of the liver is unremarkable. Pancreas: Unremarkable. No pancreatic ductal dilatation or surrounding inflammatory changes. Spleen: Normal in size without focal abnormality. Adrenals/Urinary Tract: Left renal cortical cyst. Mild bilateral renal cortical thinning. No urinary tract calculi or obstructive uropathy. The adrenals and bladder are grossly normal. Stomach/Bowel: No bowel obstruction or ileus. Normal appendix right lower quadrant. Diverticulosis of the distal colon without evidence of diverticulitis. No bowel wall thickening or inflammatory change. Vascular/Lymphatic: Aortic atherosclerosis. No enlarged abdominal or pelvic lymph nodes. Reproductive: Prostate is unremarkable. Other: Trace pelvic free fluid is nonspecific. No free intraperitoneal gas. No abdominal wall hernia. Musculoskeletal: No acute or destructive bony lesions. Reconstructed images demonstrate no additional findings. IMPRESSION: 1. Patchy bilateral perihilar airspace disease, in a pattern suggesting multifocal pneumonia over edema. 2. Bilateral pleural effusions, less than 1 L each. 3. Cardiomegaly, with extensive coronary artery atherosclerosis. 4. Distal colonic  diverticulosis without diverticulitis. 5. Trace ascites lower pelvis, nonspecific. 6. High attenuation material layering dependently within the gallbladder, consistent with multiple small calculi or sludge. Less likely vicarious excretion of previously administered contrast. No evidence of cholecystitis. 7.  Aortic Atherosclerosis (ICD10-I70.0). Electronically Signed   By: Randa Ngo M.D.   On: 11/27/2020 21:02     Patient Profile     70 y.o. male with low output CHF on dobutamine infusion, AKI on CKD 3, severe multivessel CAD, and severe mitral regurgitation  Assessment & Plan    1. Acute on chronic systolic heart failure, inotrope dependent 2. Acute on chronic anemia, unclear source at present, repeat prbc tx given yesterday HgB 7.3--->8.7 3. Severe MR, likely secondary 4. CAD, severe multivessel with old anterior MI and residual severe stenoses in LCx and RCA 5. AKI (contrast nephropathy) on CKD 3. Creatinine peak at 4.05, now down to 2.8 with what appears to be post-ATN diuresis.   D/W Dr Haroldine Laws. Pt with advanced low output HF. Considering 2 vessel PCI followed by transcatheter edge to edge mitral valve repair. Pt also being considered for advanced HF therapies but  hasn't been inclined to move forward with VAD evaluation and now renal function is big issue. Will continue to follow. Discussed timing of interventions: home with dobutamine to allow longer renal recovery versus inpatient PCI if creatinine continues to trend in a favorable direction. Regardless, he will be at very high risk of repeat contrast nephropathy with coronary intervention. He will need TEE if we are going to consider MitraClip in the future. Discussed all of these issues with the patient today.      For questions or updates, please contact [place] Please consult [email or web address] for contact info under        Signed, Sherren Mocha, MD  11/28/2020, 2:21 PM

## 2020-11-28 NOTE — Progress Notes (Signed)
Patient ID: Marc [name], male   DOB: 01-21-1951, 70 y.o.   MRN: [id number]    Advanced Heart Failure Rounding Note   Subjective:    LHC 4/5 showed multivessel CAD. There are targets for PCI, though cMRI showed EF 22% limited viability.  Remains on DBA 4. Labs pending and co-ox 56% CVP 13  Underwent CT C/A/P yesterday. No evidence of malignancy  Got 1u RBC yesterday. Hgb 7.3 -> 8.7  Objective:   Weight Range:  Vital Signs:   Temp:  [97.5 F (36.4 C)-98.2 F (36.8 C)] 97.8 F (36.6 C) (04/12 0542) Pulse Rate:  [105-114] 106 (04/12 0542) Resp:  [17-24] 23 (04/12 0542) BP: (93-114)/(70-80) 109/72 (04/12 0542) SpO2:  [95 %-100 %] 98 % (04/12 0542) Weight:  [74.9 kg] 74.9 kg (04/12 0542) Last BM Date: 11/26/20  Weight change: Filed Weights   11/27/20 0026 11/27/20 0500 11/28/20 0542  Weight: 74.6 kg 74.6 kg 74.9 kg    Intake/Output:   Intake/Output Summary (Last 24 hours) at 11/28/2020 0622 Last data filed at 11/28/2020 0546 Gross per 24 hour  Intake 1467.62 ml  Output 1100 ml  Net 367.62 ml   Exam: General:  Sitting up in bed No resp difficulty HEENT: normal Neck: supple. no JVD to jaw  Carotids 2+ bilat; no bruits. No lymphadenopathy or thryomegaly appreciated. Cor: PMI nondisplaced. Regular tachy 3/6 MR Lungs: clear Abdomen: soft, nontender, nondistended. No hepatosplenomegaly. No bruits or masses. Good bowel sounds. Extremities: no cyanosis, clubbing, rash, 1+ edema + UNNA Neuro: alert & orientedx3, cranial nerves grossly intact. moves all 4 extremities w/o difficulty. Affect pleasant  Telemetry:  Sinus Tach 100-110 Personally reviewed   Labs: Basic Metabolic Panel: Recent Labs  Lab 11/23/20 0437 11/24/20 0516 11/25/20 0300 11/26/20 0600 11/27/20 0500  NA 134* 133* 130* 133* 132*  K 4.3 3.8 3.6 3.4* 3.7  CL 96* 93* 94* 94* 93*  CO2 '29 27 27 27 28  '$ GLUCOSE 159* 133* 177* 164* 229*  BUN 47* 54* 62* 60* 58*  CREATININE 3.23* 3.88* 4.05* 3.62*  3.31*  CALCIUM 8.8* 8.7* 8.6* 8.7* 8.8*    Liver Function Tests: No results for input(s): AST, ALT, ALKPHOS, BILITOT, PROT, ALBUMIN in the last 168 hours. No results for input(s): LIPASE, AMYLASE in the last 168 hours. No results for input(s): AMMONIA in the last 168 hours.  CBC: Recent Labs  Lab 11/22/20 0519 11/23/20 0437 11/25/20 1439 11/26/20 0600 11/27/20 0500 11/27/20 1900  WBC 6.6 7.3 6.3 6.5 5.8  --   HGB 8.1* 8.1* 7.7* 7.7* 7.3* 8.7*  HCT 26.2* 25.9* 24.7* 24.2* 23.5* 26.8*  MCV 81.1 81.2 79.7* 79.3* 80.5  --   PLT 199 192 215 221 241  --     Cardiac Enzymes: No results for input(s): CKTOTAL, CKMB, CKMBINDEX, TROPONINI in the last 168 hours.  BNP: BNP (last 3 results) Recent Labs    11/11/20 2158  BNP 1,728.5*    ProBNP (last 3 results) Recent Labs    10/24/20 1119  PROBNP 1,685*      Other results:  Imaging: CT CHEST ABDOMEN PELVIS WO CONTRAST  Result Date: 11/27/2020 CLINICAL DATA:  Iron deficiency anemia, weight loss EXAM: CT CHEST, ABDOMEN AND PELVIS WITHOUT CONTRAST TECHNIQUE: Multidetector CT imaging of the chest, abdomen and pelvis was performed following the standard protocol without IV contrast. COMPARISON:  11/11/2020, 11/22/2020 FINDINGS: CT CHEST FINDINGS Cardiovascular: Mild cardiomegaly without pericardial effusion. Normal caliber of the thoracic aorta. Aberrant origin of the right subclavian artery  is seen, a frequent anatomic variant. There is extensive atherosclerosis of the coronary vasculature, with minimal atherosclerosis of the right subclavian artery. Right-sided PICC line tip within the superior vena cava. Mediastinum/Nodes: No enlarged mediastinal, hilar, or axillary lymph nodes. Thyroid gland, trachea, and esophagus demonstrate no significant findings. Lungs/Pleura: Small bilateral pleural effusions volume estimated less than 1 L each. Patchy areas of airspace disease are seen bilaterally, which could reflect multifocal infection or  edema. No pneumothorax. Central airways are patent. Musculoskeletal: No acute or destructive bony lesions. Reconstructed images demonstrate no additional findings. CT ABDOMEN PELVIS FINDINGS Hepatobiliary: High attenuation material dependently within the gallbladder could reflect vicarious excretion of previously administered contrast versus sludge. No evidence of cholecystitis. Unenhanced imaging of the liver is unremarkable. Pancreas: Unremarkable. No pancreatic ductal dilatation or surrounding inflammatory changes. Spleen: Normal in size without focal abnormality. Adrenals/Urinary Tract: Left renal cortical cyst. Mild bilateral renal cortical thinning. No urinary tract calculi or obstructive uropathy. The adrenals and bladder are grossly normal. Stomach/Bowel: No bowel obstruction or ileus. Normal appendix right lower quadrant. Diverticulosis of the distal colon without evidence of diverticulitis. No bowel wall thickening or inflammatory change. Vascular/Lymphatic: Aortic atherosclerosis. No enlarged abdominal or pelvic lymph nodes. Reproductive: Prostate is unremarkable. Other: Trace pelvic free fluid is nonspecific. No free intraperitoneal gas. No abdominal wall hernia. Musculoskeletal: No acute or destructive bony lesions. Reconstructed images demonstrate no additional findings. IMPRESSION: 1. Patchy bilateral perihilar airspace disease, in a pattern suggesting multifocal pneumonia over edema. 2. Bilateral pleural effusions, less than 1 L each. 3. Cardiomegaly, with extensive coronary artery atherosclerosis. 4. Distal colonic diverticulosis without diverticulitis. 5. Trace ascites lower pelvis, nonspecific. 6. High attenuation material layering dependently within the gallbladder, consistent with multiple small calculi or sludge. Less likely vicarious excretion of previously administered contrast. No evidence of cholecystitis. 7.  Aortic Atherosclerosis (ICD10-I70.0). Electronically Signed   By: Randa Ngo  M.D.   On: 11/27/2020 21:02     Medications:     Scheduled Medications: . sodium chloride   Intravenous Once  . aspirin EC  81 mg Oral Daily  . atorvastatin  40 mg Oral Daily  . calcium carbonate  1 tablet Oral BID WC  . Chlorhexidine Gluconate Cloth  6 each Topical Daily  . enoxaparin (LOVENOX) injection  30 mg Subcutaneous Q24H  . ezetimibe  10 mg Oral Daily  . fluticasone  2 spray Each Nare BID  . insulin aspart  0-15 Units Subcutaneous TID AC & HS  . multivitamin with minerals  1 tablet Oral Daily  . omega-3 acid ethyl esters  1 g Oral Daily  . pantoprazole  40 mg Oral Daily  . polyethylene glycol  17 g Oral Daily  . sodium chloride flush  10-40 mL Intracatheter Q12H  . zolpidem  5 mg Oral Once    Infusions: . sodium chloride    . DOBUTamine 4 mcg/kg/min (11/26/20 1730)    PRN Medications: sodium chloride, acetaminophen, calcium carbonate, guaiFENesin-dextromethorphan, ondansetron (ZOFRAN) IV, sodium chloride flush, sodium chloride flush   Assessment/Plan:   1. A/C Systolic Heart Failure,  ICM  - ECHO 2019 35-40%  - ECHO EF 20-25% RV normal Mod -Severe MR.  - RHC 11/16/20 - PCWP 25, RA 8, PA sat 46%   - BB and ARNI held due to hypotension.  - Intolerant SGT2i with yeast infections.  - No digoxin with AKI   - cMRI 11/20/20 EF 22% with mild-mod RV dysfunction. Diffuse LGE suggestive of severe ischemic CM. Little viable myocardium -  In looking at MRI, Suspect this is end-stage ischemic CM with low output. Cath w/ severe MVCAD but given limited viability on cMRI not sure revascularization will give him durable benefit. Ideally, would get 2v PCI and MitraClip at some point but with dramatic CIN likely not an option in the near future even if renal function recovers over the next week given high risk of recurrent CIN. LVAD not option right now with AKI.  - Now on dobutamine to 4. Co-ox 56%. Will continue at least until creatinine plateaus  - Renal function improving.Lasix  stopped yesterday. Will restart - Continue unna boots.   2. Chronic Anemia, iron deficient  - EGD 2022 - normal. No recent colonoscopy - FOBT negative - Received feraheme this admit.  - SPEP negative -  EPO 50  - 4/3 Given 1UPRBCs. 4/11 given 1u RBCs - Hgb 7.3 -> 8.7 No obvious source of bleeding - On protonix.  - Will likely need colonoscopy eventually but may be too tenuous. CT C/A/P (no contrast on 4/11 no evidence of malignancy   3. CAD - Anterior MI 1997 PTCA LAD distal circ and 1st diagonal DES 2011 - LHC this admit w/ severe MVCAD  - There are targets for PCI in OM and RCA but risk of recurrent CIN very high. Will defer for now. May be better to let kidneys rest for several weeks and re-evaluating - No current angina - continue ASA + statin + Zetia   4. AKI on CKD Stage IIIb - Creatinine baseline 1.6-1.8 , creatinine 1.66>>1.95>>3.23>3.9>4.05 -> 3.6 -> 3.3. Suspect contrast nephropathy.  Continue DBA - - Renal function continues to improve. Will await to see new baseline. Have decided to avoid repeat contrast exposure for at least a few weeks given severity of AKI with diagnostic cath only - Appreciate Renal input.   5. Severe MR - this is functional.  LV not markedly dilated with LVEDD 5.8 cm, may be Mitraclip candidate but awaiting renal recovery.   Overall continues to struggle. He has low cardiac output with multi-vessel CAD and severe MR. Now inotrope dependent. Also with persistent anemia and severe AKI. I am not sure that we will have much to offer him that will really improve his situation at this point. Continue to await recovery of renal function but may need to consider Palliative Consult. D/w Dr. Burt Knack.     Length of Stay: 17   Glori Bickers, MD  6:22 AM

## 2020-11-28 NOTE — Progress Notes (Addendum)
[facility] Progress Note   Home meds: - metformin 1gm bid/ amaryl 4 qd - lipitor 40 / coreg 12.5 bid/ entresto 49-- 51 bid/ zetia 10/ lasix 40 qd/ sl ntg prn/ plavix 75 qd - protonix 40 bid - prn's/ vitamins/ supplements  Assessment/ Plan:   1. AKI on CKD 3a - b/l creatinine 1.4- 1.6, eGFR 44-51. AKI due to IV contrast (4/5) + systolic CHF.   Cr peaked at 4.0 on 4/09, down to 2.8 today from 3.3 yesterday.  Renal US w/o obstruction. UA neg and urine lytes c/w ATN. Good UOP, had been getting Lasix (last dose 4/10) + dobutamine per CHF team.  -Lasix was resumed today with improvement of kidney function.    - Renal functioning continues to slowly improve; signing off at this time. Please reconsult as needed.  2. Chronic combined diast/ syst CHF - severe, LVEF 20-25% by ECHO. Severe 3VCAD by cath, has targets for PCI but poor viability by cMRI. Started on IV dobutamine continuous on 3/31 here.  -Per HF team. Starting diuresis today.  3. Anemia - Denies melena or bloody stool transfused 2nd unit yesterday. CT chest abdomen did not show evidence of malignancy. Hb improved to 8.7 (from 7.3 yesterday) after transfusion. 4. CAD with h/o MI 78. 5. DM2 6. H/o HTN - bp's soft here, not getting any BP lowering medication 7. HL  Subjective:   Patient is doing well. No complaints. Denies SOB. He feels a little better after he received another transfusion yesterday.   Objective:   BP 109/76   Pulse (!) 109   Temp 97.8 F (36.6 C)   Resp (!) 23   Ht 5' 7"  (1.702 m)   Wt 74.9 kg   SpO2 95%   BMI 25.86 kg/m   Intake/Output Summary (Last 24 hours) at 11/28/2020 1043 Last data filed at 11/28/2020 0847 Gross per 24 hour  Intake 1587.62 ml  Output 1300 ml  Net 287.62 ml   Weight change: 0.9 kg  Physical Exam:  General: no acute distress  Cv: Regular rhythm, mild tachycardia Pulm: CTA bilaterally  Abd: Soft, non tender to palpation Extremities: in Unna boot, 1+  edema Neuro: A&O x 3  Imaging: CT CHEST ABDOMEN PELVIS WO CONTRAST  Result Date: 11/27/2020 CLINICAL DATA:  Iron deficiency anemia, weight loss EXAM: CT CHEST, ABDOMEN AND PELVIS WITHOUT CONTRAST TECHNIQUE: Multidetector CT imaging of the chest, abdomen and pelvis was performed following the standard protocol without IV contrast. COMPARISON:  11/11/2020, 11/22/2020 FINDINGS: CT CHEST FINDINGS Cardiovascular: Mild cardiomegaly without pericardial effusion. Normal caliber of the thoracic aorta. Aberrant origin of the right subclavian artery is seen, a frequent anatomic variant. There is extensive atherosclerosis of the coronary vasculature, with minimal atherosclerosis of the right subclavian artery. Right-sided PICC line tip within the superior vena cava. Mediastinum/Nodes: No enlarged mediastinal, hilar, or axillary lymph nodes. Thyroid gland, trachea, and esophagus demonstrate no significant findings. Lungs/Pleura: Small bilateral pleural effusions volume estimated less than 1 L each. Patchy areas of airspace disease are seen bilaterally, which could reflect multifocal infection or edema. No pneumothorax. Central airways are patent. Musculoskeletal: No acute or destructive bony lesions. Reconstructed images demonstrate no additional findings. CT ABDOMEN PELVIS FINDINGS Hepatobiliary: High attenuation material dependently within the gallbladder could reflect vicarious excretion of previously administered contrast versus sludge. No evidence of cholecystitis. Unenhanced imaging of the liver is unremarkable. Pancreas: Unremarkable. No pancreatic ductal dilatation or surrounding inflammatory changes. Spleen: Normal in size without focal abnormality. Adrenals/Urinary Tract: Left  renal cortical cyst. Mild bilateral renal cortical thinning. No urinary tract calculi or obstructive uropathy. The adrenals and bladder are grossly normal. Stomach/Bowel: No bowel obstruction or ileus. Normal appendix right lower quadrant.  Diverticulosis of the distal colon without evidence of diverticulitis. No bowel wall thickening or inflammatory change. Vascular/Lymphatic: Aortic atherosclerosis. No enlarged abdominal or pelvic lymph nodes. Reproductive: Prostate is unremarkable. Other: Trace pelvic free fluid is nonspecific. No free intraperitoneal gas. No abdominal wall hernia. Musculoskeletal: No acute or destructive bony lesions. Reconstructed images demonstrate no additional findings. IMPRESSION: 1. Patchy bilateral perihilar airspace disease, in a pattern suggesting multifocal pneumonia over edema. 2. Bilateral pleural effusions, less than 1 L each. 3. Cardiomegaly, with extensive coronary artery atherosclerosis. 4. Distal colonic diverticulosis without diverticulitis. 5. Trace ascites lower pelvis, nonspecific. 6. High attenuation material layering dependently within the gallbladder, consistent with multiple small calculi or sludge. Less likely vicarious excretion of previously administered contrast. No evidence of cholecystitis. 7.  Aortic Atherosclerosis (ICD10-I70.0). Electronically Signed   By: Randa Ngo M.D.   On: 11/27/2020 21:02    Labs: BMET Recent Labs  Lab 11/22/20 1043 11/23/20 0437 11/24/20 0516 11/25/20 0300 11/26/20 0600 11/27/20 0500 11/28/20 0532  NA 132* 134* 133* 130* 133* 132* 132*  K 4.2 4.3 3.8 3.6 3.4* 3.7 3.8  CL 93* 96* 93* 94* 94* 93* 97*  CO2 30 29 27 27 27 28 27   GLUCOSE 241* 159* 133* 177* 164* 229* 130*  BUN 38* 47* 54* 62* 60* 58* 56*  CREATININE 2.33* 3.23* 3.88* 4.05* 3.62* 3.31* 2.85*  CALCIUM 9.1 8.8* 8.7* 8.6* 8.7* 8.8* 8.6*   CBC Recent Labs  Lab 11/25/20 1439 11/26/20 0600 11/27/20 0500 11/27/20 1900 11/28/20 0532  WBC 6.3 6.5 5.8  --  7.3  HGB 7.7* 7.7* 7.3* 8.7* 8.7*  HCT 24.7* 24.2* 23.5* 26.8* 27.4*  MCV 79.7* 79.3* 80.5  --  80.6  PLT 215 221 241  --  265    Medications:    . sodium chloride   Intravenous Once  . aspirin EC  81 mg Oral Daily  .  atorvastatin  40 mg Oral Daily  . calcium carbonate  1 tablet Oral BID WC  . Chlorhexidine Gluconate Cloth  6 each Topical Daily  . enoxaparin (LOVENOX) injection  30 mg Subcutaneous Q24H  . ezetimibe  10 mg Oral Daily  . fluticasone  2 spray Each Nare BID  . insulin aspart  0-15 Units Subcutaneous TID AC & HS  . multivitamin with minerals  1 tablet Oral Daily  . omega-3 acid ethyl esters  1 g Oral Daily  . pantoprazole  40 mg Oral Daily  . polyethylene glycol  17 g Oral Daily  . sodium chloride flush  10-40 mL Intracatheter Q12H  . zolpidem  5 mg Oral Once    Dewayne Hatch, MD [id number] 11/28/2020, 11:17 AM  I have seen and examined this patient and agree with the plan of care.   Dwana Melena, MD 11/28/2020, 1:36 PM

## 2020-11-29 LAB — GLUCOSE, CAPILLARY
Glucose-Capillary: 146 mg/dL — ABNORMAL HIGH (ref 70–99)
Glucose-Capillary: 228 mg/dL — ABNORMAL HIGH (ref 70–99)
Glucose-Capillary: 223 mg/dL — ABNORMAL HIGH (ref 70–99)
Glucose-Capillary: 205 mg/dL — ABNORMAL HIGH (ref 70–99)

## 2020-11-29 LAB — BASIC METABOLIC PANEL
Anion gap: 13 (ref 5–15)
BUN: 54 mg/dL — ABNORMAL HIGH (ref 8–23)
CO2: 25 mmol/L (ref 22–32)
Calcium: 8.6 mg/dL — ABNORMAL LOW (ref 8.9–10.3)
Chloride: 97 mmol/L — ABNORMAL LOW (ref 98–111)
Creatinine, Ser: 2.5 mg/dL — ABNORMAL HIGH (ref 0.61–1.24)
GFR, Estimated: 27 mL/min — ABNORMAL LOW (ref >60)
Glucose, Bld: 133 mg/dL — ABNORMAL HIGH (ref 70–99)
Potassium: 3.7 mmol/L (ref 3.5–5.1)
Sodium: 135 mmol/L (ref 135–145)

## 2020-11-29 LAB — CBC
HCT: 26.9 % — ABNORMAL LOW (ref 39.0–52.0)
Hemoglobin: 8.4 g/dL — ABNORMAL LOW (ref 13.0–17.0)
MCH: 25.7 pg — ABNORMAL LOW (ref 26.0–34.0)
MCHC: 31.2 g/dL (ref 30.0–36.0)
MCV: 82.3 fL (ref 80.0–100.0)
Platelets: 299 10*3/uL (ref 150–400)
RBC: 3.27 MIL/uL — ABNORMAL LOW (ref 4.22–5.81)
RDW: 18.4 % — ABNORMAL HIGH (ref 11.5–15.5)
WBC: 7.1 10*3/uL (ref 4.0–10.5)
nRBC: 0 % (ref 0.0–0.2)

## 2020-11-29 LAB — COOXEMETRY PANEL
Carboxyhemoglobin: 1.7 % — ABNORMAL HIGH (ref 0.5–1.5)
Methemoglobin: 0.5 % (ref 0.0–1.5)
O2 Saturation: 56.5 %
Total hemoglobin: 8.7 g/dL — ABNORMAL LOW (ref 12.0–16.0)

## 2020-11-29 MED ORDER — FUROSEMIDE 10 MG/ML IJ SOLN
120.0000 mg | Freq: Two times a day (BID) | INTRAVENOUS | Status: DC
  Administered 2020-11-29 – 2020-12-01 (×4): 120 mg via INTRAVENOUS
  Filled 2020-11-29: qty 10
  Filled 2020-11-29 (×3): qty 12
  Filled 2020-11-29: qty 10
  Filled 2020-11-29: qty 12

## 2020-11-29 MED ORDER — POTASSIUM CHLORIDE CRYS ER 20 MEQ PO TBCR
40.0000 meq | EXTENDED_RELEASE_TABLET | Freq: Once | ORAL | Status: AC
  Administered 2020-11-29: 40 meq via ORAL
  Filled 2020-11-29: qty 2

## 2020-11-29 NOTE — Progress Notes (Signed)
   11/29/20 0741  Assess: MEWS Score  Temp 97.8 F (36.6 C)  BP 111/71  Pulse Rate (!) 104  ECG Heart Rate (!) 105  Resp 19  Level of Consciousness Alert  SpO2 99 %  Assess: MEWS Score  MEWS Temp 0  MEWS Systolic 0  MEWS Pulse 1  MEWS RR 0  MEWS LOC 0  MEWS Score 1  MEWS Score Color Green  Treat  Pain Scale 0-10  Pain Score 0  Complains of Other (Comment) (none)  Patients response to intervention Effective  Take Vital Signs  Increase Vital Sign Frequency  Yellow: Q 2hr X 2 then Q 4hr X 2, if remains yellow, continue Q 4hrs  Escalate  MEWS: Escalate Yellow: discuss with charge nurse/RN and consider discussing with provider and RRT  Notify: Charge Nurse/RN  Name of Charge Nurse/RN Notified Sharmon Leyden Rn  Date Charge Nurse/RN Notified 11/29/20  Time Charge Nurse/RN Notified [phone]  Document  Patient Outcome Other (Comment)  Progress note created (see row info) Yes  Yellow at Baseline reassessed with a Green score.

## 2020-11-29 NOTE — Progress Notes (Signed)
  Mobility Specialist Criteria Algorithm Info.  SATURATION QUALIFICATIONS: (This note is used to comply with regulatory documentation for home oxygen)  Patient Saturations on Room Air at Rest = 95%  Patient Saturations on Room Air while Ambulating = 88%  Patient Saturations on 1 Liters of oxygen while Ambulating = 95%  Please briefly explain why patient needs home oxygen:  Mobility Team: [facility] elevated:Self regulated Activity: Ambulated in hall (in chair before and after) Range of motion: Active; All extremities Level of assistance: Standby assist, set-up cues, supervision of patient - no hands on Assistive device: None; Other (Comment) (IV Pole) Minutes sitting in chair:  Minutes stood: 5 minutes Minutes ambulated: 5 minutes Distance ambulated (ft): 340 ft Mobility response: Tolerated well (LOB x 1) Bed Position: Chair  Patient received sitting in recliner chair, agreed to participate in mobility this morning. Ambulated in hallway 340 feet with steady gait at supervision using IV pole to balance. Complained of SOB and feeling weaker than baseline, mentioned being limited by IV lines and telemetry cords. Required standing rest breaks x2, denied any dizziness or lightheadedness. Oxygen saturation briefly dropped to 88% while on room air, 1LO2 was given and his O2 saturation increased quickly to 95%. Tolerated ambulation well without complaint or incident and is sitting in recliner chair with all needs met.   11/29/2020 11:23 AM

## 2020-11-29 NOTE — Progress Notes (Signed)
Patient ID: Marc [name], male   DOB: [birth year], 70 y.o.   MRN: [id number]    Advanced Heart Failure Rounding Note   Subjective:    - LHC 4/5 showed multivessel CAD. There are targets for PCI, though cMRI showed EF 22% limited viability. -CT C/A/P 11/27/20. No evidence of malignancy  Remains on DBA 4. Labs pending and co-ox 57% CVP 11-12  Restarted on IV lasix yesterday. Weight down 3 pounds.   Feels weak. Denies CP or SOB. No orthopnea or PND.    Objective:   Weight Range:  Vital Signs:   Temp:  [97.8 F (36.6 C)-98.5 F (36.9 C)] 98.5 F (36.9 C) (04/13 1206) Pulse Rate:  [96-110] 96 (04/13 1206) Resp:  [16-20] 20 (04/13 1206) BP: (93-111)/(64-75) 98/72 (04/13 1206) SpO2:  [98 %-100 %] 100 % (04/13 1206) Weight:  [73.6 kg] 73.6 kg (04/13 0551) Last BM Date: 11/26/20  Weight change: Filed Weights   11/27/20 0500 11/28/20 0542 11/29/20 0551  Weight: 74.6 kg 74.9 kg 73.6 kg    Intake/Output:   Intake/Output Summary (Last 24 hours) at 11/29/2020 1410 Last data filed at 11/29/2020 1000 Gross per 24 hour  Intake 663.53 ml  Output 1600 ml  Net -936.47 ml   Exam: General:  Sitting in chair No resp difficulty HEENT: normal Neck: supple. JVP to jaw  Carotids 2+ bilat; no bruits. No lymphadenopathy or thryomegaly appreciated. Cor: PMI nondisplaced. Regular tachy 3/6 MR Lungs: clear Abdomen: soft, nontender, nondistended. No hepatosplenomegaly. No bruits or masses. Good bowel sounds. Extremities: no cyanosis, clubbing, rash, tr-1+ edema + UNNA Neuro: alert & orientedx3, cranial nerves grossly intact. moves all 4 extremities w/o difficulty. Affect pleasant  Telemetry:  Sinus Tach 100-110 Personally reviewed   Labs: Basic Metabolic Panel: Recent Labs  Lab 11/25/20 0300 11/26/20 0600 11/27/20 0500 11/28/20 0532 11/29/20 0550  NA 130* 133* 132* 132* 135  K 3.6 3.4* 3.7 3.8 3.7  CL 94* 94* 93* 97* 97*  CO2 '27 27 28 27 25  '$ GLUCOSE 177* 164* 229* 130* 133*   BUN 62* 60* 58* 56* 54*  CREATININE 4.05* 3.62* 3.31* 2.85* 2.50*  CALCIUM 8.6* 8.7* 8.8* 8.6* 8.6*  MG  --   --   --  1.8  --     Liver Function Tests: No results for input(s): AST, ALT, ALKPHOS, BILITOT, PROT, ALBUMIN in the last 168 hours. No results for input(s): LIPASE, AMYLASE in the last 168 hours. No results for input(s): AMMONIA in the last 168 hours.  CBC: Recent Labs  Lab 11/25/20 1439 11/26/20 0600 11/27/20 0500 11/27/20 1900 11/28/20 0532 11/29/20 0550  WBC 6.3 6.5 5.8  --  7.3 7.1  HGB 7.7* 7.7* 7.3* 8.7* 8.7* 8.4*  HCT 24.7* 24.2* 23.5* 26.8* 27.4* 26.9*  MCV 79.7* 79.3* 80.5  --  80.6 82.3  PLT 215 221 241  --  265 299    Cardiac Enzymes: No results for input(s): CKTOTAL, CKMB, CKMBINDEX, TROPONINI in the last 168 hours.  BNP: BNP (last 3 results) Recent Labs    11/11/20 2158  BNP 1,728.5*    ProBNP (last 3 results) Recent Labs    10/24/20 1119  PROBNP 1,685*      Other results:  Imaging: CT CHEST ABDOMEN PELVIS WO CONTRAST  Result Date: 11/27/2020 CLINICAL DATA:  Iron deficiency anemia, weight loss EXAM: CT CHEST, ABDOMEN AND PELVIS WITHOUT CONTRAST TECHNIQUE: Multidetector CT imaging of the chest, abdomen and pelvis was performed following the standard protocol without IV contrast.  COMPARISON:  11/11/2020, 11/22/2020 FINDINGS: CT CHEST FINDINGS Cardiovascular: Mild cardiomegaly without pericardial effusion. Normal caliber of the thoracic aorta. Aberrant origin of the right subclavian artery is seen, a frequent anatomic variant. There is extensive atherosclerosis of the coronary vasculature, with minimal atherosclerosis of the right subclavian artery. Right-sided PICC line tip within the superior vena cava. Mediastinum/Nodes: No enlarged mediastinal, hilar, or axillary lymph nodes. Thyroid gland, trachea, and esophagus demonstrate no significant findings. Lungs/Pleura: Small bilateral pleural effusions volume estimated less than 1 L each. Patchy  areas of airspace disease are seen bilaterally, which could reflect multifocal infection or edema. No pneumothorax. Central airways are patent. Musculoskeletal: No acute or destructive bony lesions. Reconstructed images demonstrate no additional findings. CT ABDOMEN PELVIS FINDINGS Hepatobiliary: High attenuation material dependently within the gallbladder could reflect vicarious excretion of previously administered contrast versus sludge. No evidence of cholecystitis. Unenhanced imaging of the liver is unremarkable. Pancreas: Unremarkable. No pancreatic ductal dilatation or surrounding inflammatory changes. Spleen: Normal in size without focal abnormality. Adrenals/Urinary Tract: Left renal cortical cyst. Mild bilateral renal cortical thinning. No urinary tract calculi or obstructive uropathy. The adrenals and bladder are grossly normal. Stomach/Bowel: No bowel obstruction or ileus. Normal appendix right lower quadrant. Diverticulosis of the distal colon without evidence of diverticulitis. No bowel wall thickening or inflammatory change. Vascular/Lymphatic: Aortic atherosclerosis. No enlarged abdominal or pelvic lymph nodes. Reproductive: Prostate is unremarkable. Other: Trace pelvic free fluid is nonspecific. No free intraperitoneal gas. No abdominal wall hernia. Musculoskeletal: No acute or destructive bony lesions. Reconstructed images demonstrate no additional findings. IMPRESSION: 1. Patchy bilateral perihilar airspace disease, in a pattern suggesting multifocal pneumonia over edema. 2. Bilateral pleural effusions, less than 1 L each. 3. Cardiomegaly, with extensive coronary artery atherosclerosis. 4. Distal colonic diverticulosis without diverticulitis. 5. Trace ascites lower pelvis, nonspecific. 6. High attenuation material layering dependently within the gallbladder, consistent with multiple small calculi or sludge. Less likely vicarious excretion of previously administered contrast. No evidence of  cholecystitis. 7.  Aortic Atherosclerosis (ICD10-I70.0). Electronically Signed   By: Randa Ngo M.D.   On: 11/27/2020 21:02     Medications:     Scheduled Medications: . sodium chloride   Intravenous Once  . aspirin EC  81 mg Oral Daily  . atorvastatin  40 mg Oral Daily  . calcium carbonate  1 tablet Oral BID WC  . Chlorhexidine Gluconate Cloth  6 each Topical Daily  . enoxaparin (LOVENOX) injection  30 mg Subcutaneous Q24H  . ezetimibe  10 mg Oral Daily  . fluticasone  2 spray Each Nare BID  . insulin aspart  0-15 Units Subcutaneous TID AC & HS  . multivitamin with minerals  1 tablet Oral Daily  . omega-3 acid ethyl esters  1 g Oral Daily  . pantoprazole  40 mg Oral Daily  . polyethylene glycol  17 g Oral Daily  . sodium chloride flush  10-40 mL Intracatheter Q12H  . zolpidem  5 mg Oral Once    Infusions: . sodium chloride    . DOBUTamine 4 mcg/kg/min (11/28/20 2130)  . furosemide      PRN Medications: sodium chloride, acetaminophen, calcium carbonate, guaiFENesin-dextromethorphan, ondansetron (ZOFRAN) IV, sodium chloride flush, sodium chloride flush   Assessment/Plan:   1. A/C Systolic Heart Failure,  ICM  - ECHO 2019 35-40%  - ECHO EF 20-25% RV normal Mod -Severe MR.  - RHC 11/16/20 - PCWP 25, RA 8, PA sat 46%   - BB and ARNI held due to hypotension.  -  Intolerant SGT2i with yeast infections.  - No digoxin with AKI   - cMRI 11/20/20 EF 22% with mild-mod RV dysfunction. Diffuse LGE suggestive of severe ischemic CM. Little viable myocardium - In looking at MRI, Suspect this is end-stage ischemic CM with low output. Cath w/ severe MVCAD but given limited viability on cMRI not sure revascularization will give him durable benefit. Ideally, would get 2v PCI and MitraClip at some point but with dramatic CIN likely not an option in the near future even if renal function recovers over the next week given high risk of recurrent CIN. LVAD not option right now with AKI.  - Now  on dobutamine to 4. Co-ox 57%. Will continue at least until creatinine plateaus  - Renal function improving. Remains volume overloaded. Will continue to diurese - Continue unna boots.   2. Chronic Anemia, iron deficient  - EGD 2022 - normal. No recent colonoscopy - FOBT negative - Received feraheme this admit.  - SPEP negative -  EPO and LDH ok - 4/3 Given 1UPRBCs. 4/11 given 1u RBCs - Hgb 7.3 -> 8.7 -> 8.4 No obvious source of bleeding - On protonix.  - Will likely need colonoscopy eventually but may be too tenuous. CT C/A/P (no contrast on 4/11 no evidence of malignancy   3. CAD - Anterior MI 1997 PTCA LAD distal circ and 1st diagonal DES 2011 - LHC this admit w/ severe MVCAD  - There are targets for PCI in OM and RCA but risk of recurrent CIN very high. Will defer for now. May be better to let kidneys rest for several weeks and re-evaluating - No current angina - continue ASA + statin + Zetia   4. AKI on CKD Stage IIIb - Creatinine baseline 1.6-1.8 , creatinine 1.66>>1.95>>3.23>3.9>4.05 -> 3.6 -> 3.3 -> 2.8 -> 2.5. Suspect contrast nephropathy.  Continue DBA - Renal function continues to improve. Will await to see new baseline. Have decided to avoid repeat contrast exposure for at least a few weeks given severity of AKI with diagnostic cath only - Appreciate Renal input.   5. Severe MR - this is functional.  LV not markedly dilated with LVEDD 5.8 cm, may be Mitraclip candidate but awaiting renal recovery.   Overall continues to struggle. He has low cardiac output with multi-vessel CAD and severe MR. Now inotrope dependent. Also with persistent anemia and severe AKI. I am not sure that we will have much to offer him that will really improve his situation at this point. Continue to await recovery of renal function. We discussed next steps again today. I suspect the best plan will be to get him home on dobutamine (if able) and let him settle out for a few weeks. Hopefully renal function  will continue to improve and he can get some of his strength back and then we can re-evaluate what next steps might be (if any).      Length of Stay: 18   Glori Bickers, MD  2:10 PM

## 2020-11-30 LAB — CBC
HCT: 25.9 % — ABNORMAL LOW (ref 39.0–52.0)
Hemoglobin: 8.1 g/dL — ABNORMAL LOW (ref 13.0–17.0)
MCH: 26.2 pg (ref 26.0–34.0)
MCHC: 31.3 g/dL (ref 30.0–36.0)
MCV: 83.8 fL (ref 80.0–100.0)
Platelets: 298 10*3/uL (ref 150–400)
RBC: 3.09 MIL/uL — ABNORMAL LOW (ref 4.22–5.81)
RDW: 19.1 % — ABNORMAL HIGH (ref 11.5–15.5)
WBC: 6.9 10*3/uL (ref 4.0–10.5)
nRBC: 0 % (ref 0.0–0.2)

## 2020-11-30 LAB — BASIC METABOLIC PANEL
Anion gap: 9 (ref 5–15)
BUN: 51 mg/dL — ABNORMAL HIGH (ref 8–23)
CO2: 27 mmol/L (ref 22–32)
Calcium: 8.4 mg/dL — ABNORMAL LOW (ref 8.9–10.3)
Chloride: 94 mmol/L — ABNORMAL LOW (ref 98–111)
Creatinine, Ser: 2.35 mg/dL — ABNORMAL HIGH (ref 0.61–1.24)
GFR, Estimated: 29 mL/min — ABNORMAL LOW (ref >60)
Glucose, Bld: 162 mg/dL — ABNORMAL HIGH (ref 70–99)
Potassium: 3.8 mmol/L (ref 3.5–5.1)
Sodium: 130 mmol/L — ABNORMAL LOW (ref 135–145)

## 2020-11-30 LAB — COOXEMETRY PANEL
Carboxyhemoglobin: 1.6 % — ABNORMAL HIGH (ref 0.5–1.5)
Methemoglobin: 1 % (ref 0.0–1.5)
O2 Saturation: 59.7 %
Total hemoglobin: 8.3 g/dL — ABNORMAL LOW (ref 12.0–16.0)

## 2020-11-30 LAB — GLUCOSE, CAPILLARY
Glucose-Capillary: 113 mg/dL — ABNORMAL HIGH (ref 70–99)
Glucose-Capillary: 225 mg/dL — ABNORMAL HIGH (ref 70–99)
Glucose-Capillary: 203 mg/dL — ABNORMAL HIGH (ref 70–99)
Glucose-Capillary: 158 mg/dL — ABNORMAL HIGH (ref 70–99)

## 2020-11-30 NOTE — Progress Notes (Signed)
Patient ID: COURTLAND LOTZ, male   DOB: 1951/08/07, 70 y.o.   MRN: [id number]    Advanced Heart Failure Rounding Note   Subjective:    - LHC 4/5 showed multivessel CAD. There are targets for PCI, though cMRI showed EF 22% limited viability. -CT C/A/P 11/27/20. No evidence of malignancy  Remains on DBA 4. CO-OX 60%  CVP 10-11 Weight up 1 pound  Feels better. Denies SOB, orthopnea or PND. Creatinine coming down.     Objective:   Weight Range:  Vital Signs:   Temp:  [97.7 F (36.5 C)-98.3 F (36.8 C)] 98 F (36.7 C) (04/14 1150) Pulse Rate:  [99-108] 99 (04/14 1200) Resp:  [16-21] 21 (04/14 1200) BP: (97-102)/(66-71) 99/70 (04/14 1150) SpO2:  [98 %-100 %] 100 % (04/14 1200) Weight:  [74 kg] 74 kg (04/14 0018) Last BM Date: 11/26/20  Weight change: Filed Weights   11/28/20 0542 11/29/20 0551 11/30/20 0018  Weight: 74.9 kg 73.6 kg 74 kg    Intake/Output:   Intake/Output Summary (Last 24 hours) at 11/30/2020 1238 Last data filed at 11/30/2020 1200 Gross per 24 hour  Intake 1478.52 ml  Output 2150 ml  Net -671.48 ml   Exam: General:  Sitting up in chair  No resp difficulty HEENT: normal Neck: supple. JVP 10 Carotids 2+ bilat; no bruits. No lymphadenopathy or thryomegaly appreciated. Cor: PMI nondisplaced. Regular tachy 3/6 MR.  Lungs: clear Abdomen: soft, nontender, nondistended. No hepatosplenomegaly. No bruits or masses. Good bowel sounds. Extremities: no cyanosis, clubbing, rash, 1+ edema Neuro: alert & orientedx3, cranial nerves grossly intact. moves all 4 extremities w/o difficulty. Affect pleasant  Telemetry:  Sinus Tach 100-110  Personally reviewed   Labs: Basic Metabolic Panel: Recent Labs  Lab 11/26/20 0600 11/27/20 0500 11/28/20 0532 11/29/20 0550 11/30/20 0500  NA 133* 132* 132* 135 130*  K 3.4* 3.7 3.8 3.7 3.8  CL 94* 93* 97* 97* 94*  CO2 '27 28 27 25 27  '$ GLUCOSE 164* 229* 130* 133* 162*  BUN 60* 58* 56* 54* 51*  CREATININE 3.62* 3.31*  2.85* 2.50* 2.35*  CALCIUM 8.7* 8.8* 8.6* 8.6* 8.4*  MG  --   --  1.8  --   --     Liver Function Tests: No results for input(s): AST, ALT, ALKPHOS, BILITOT, PROT, ALBUMIN in the last 168 hours. No results for input(s): LIPASE, AMYLASE in the last 168 hours. No results for input(s): AMMONIA in the last 168 hours.  CBC: Recent Labs  Lab 11/26/20 0600 11/27/20 0500 11/27/20 1900 11/28/20 0532 11/29/20 0550 11/30/20 0500  WBC 6.5 5.8  --  7.3 7.1 6.9  HGB 7.7* 7.3* 8.7* 8.7* 8.4* 8.1*  HCT 24.2* 23.5* 26.8* 27.4* 26.9* 25.9*  MCV 79.3* 80.5  --  80.6 82.3 83.8  PLT 221 241  --  265 299 298    Cardiac Enzymes: No results for input(s): CKTOTAL, CKMB, CKMBINDEX, TROPONINI in the last 168 hours.  BNP: BNP (last 3 results) Recent Labs    11/11/20 2158  BNP 1,728.5*    ProBNP (last 3 results) Recent Labs    10/24/20 1119  PROBNP 1,685*      Other results:  Imaging: No results found.   Medications:     Scheduled Medications: . sodium chloride   Intravenous Once  . aspirin EC  81 mg Oral Daily  . atorvastatin  40 mg Oral Daily  . calcium carbonate  1 tablet Oral BID WC  . Chlorhexidine Gluconate Cloth  6  each Topical Daily  . enoxaparin (LOVENOX) injection  30 mg Subcutaneous Q24H  . ezetimibe  10 mg Oral Daily  . fluticasone  2 spray Each Nare BID  . insulin aspart  0-15 Units Subcutaneous TID AC & HS  . multivitamin with minerals  1 tablet Oral Daily  . omega-3 acid ethyl esters  1 g Oral Daily  . pantoprazole  40 mg Oral Daily  . polyethylene glycol  17 g Oral Daily  . sodium chloride flush  10-40 mL Intracatheter Q12H  . zolpidem  5 mg Oral Once    Infusions: . sodium chloride    . DOBUTamine 4 mcg/kg/min (11/28/20 2130)  . furosemide 120 mg (11/30/20 0744)    PRN Medications: sodium chloride, acetaminophen, calcium carbonate, guaiFENesin-dextromethorphan, ondansetron (ZOFRAN) IV, sodium chloride flush, sodium chloride  flush   Assessment/Plan:   1. A/C Systolic Heart Failure,  ICM  - ECHO 2019 35-40%  - ECHO EF 20-25% RV normal Mod -Severe MR.  - RHC 11/16/20 - PCWP 25, RA 8, PA sat 46%   - BB and ARNI held due to hypotension.  - Intolerant SGT2i with yeast infections.  - No digoxin with AKI   - cMRI 11/20/20 EF 22% with mild-mod RV dysfunction. Diffuse LGE suggestive of severe ischemic CM. Little viable myocardium - In looking at MRI, Suspect this is end-stage ischemic CM with low output. Cath w/ severe MVCAD but given limited viability on cMRI not sure revascularization will give him durable benefit. Ideally, would get 2v PCI and MitraClip at some point but with dramatic CIN likely not an option in the near future even if renal function recovers over the next week given high risk of recurrent CIN. LVAD not option right now with AKI.  - Now on dobutamine to 4. Co-ox 60%.  - Renal function improving. Remains volume overloaded. Will continue to diurese one more day - Continue unna boots.   2. Chronic Anemia, iron deficient  - EGD 2022 - normal. No recent colonoscopy - FOBT negative - Received feraheme this admit.  - SPEP negative -  EPO and LDH ok - 4/3 Given 1UPRBCs. 4/11 given 1u RBCs - Hgb 7.3 -> 8.7 -> 8.4 ->8.4 -> 8.1 No obvious source of bleeding - On protonix.  - Will likely need colonoscopy eventually but may be too tenuous. CT C/A/P (no contrast on 4/11 no evidence of malignancy   3. CAD - Anterior MI 1997 PTCA LAD distal circ and 1st diagonal DES 2011 - LHC this admit w/ severe MVCAD  - There are targets for PCI in OM and RCA but risk of recurrent CIN very high. Will defer for now. May be better to let kidneys rest for several weeks and re-evaluating - No current angina - continue ASA + statin + Zetia   4. AKI on CKD Stage IIIb - Creatinine baseline 1.6-1.8 , creatinine 1.66>>1.95>>3.23>3.9>4.05 -> 3.6 -> 3.3 -> 2.8 -> 2.5->2.35. Suspect contrast nephropathy.  Continue DBA - Renal  function continues to improve. Will await to see new baseline. Have decided to avoid repeat contrast exposure for at least a few weeks given severity of AKI with diagnostic cath only - Appreciate Renal input.   5. Severe MR - this is functional.  LV not markedly dilated with LVEDD 5.8 cm, may be Mitraclip candidate but awaiting renal recovery.   Long discussion about next steps. Will try to get him home in the next couple of days with home doubtamine for a period of stabilization  before reassess next step. High-risk VAD or Hospice looking like they may be only options.  Length of Stay: 26  Glori Bickers, MD  1:26 PM

## 2020-11-30 NOTE — Progress Notes (Signed)
  Mobility Specialist Criteria Algorithm Info.   Mobility Team: [facility] elevated:Self regulated Activity: Ambulated in hall (in chair before and after ambulaiton) Range of motion: Active; All extremities Level of assistance: Standby assist, set-up cues, supervision of patient - no hands on Assistive device: None Minutes sitting in chair:  Minutes stood: 5 minutes Minutes ambulated: 5 minutes Distance ambulated (ft): 420 ft Mobility response: Tolerated well (standing rest x1) Bed Position: Chair  Tolerated well without complaint. Ambulated on 2LO2 saturating 93-96%  11/30/2020 11:27 AM

## 2020-11-30 NOTE — TOC Progression Note (Addendum)
Transition of Care (TOC) - Progression Note  Heart Failure   Patient Details  Name: Marc [name] MRN: [id number] Date of Birth: Dec 26, 1950  Transition of Care [facility]) CM/SW [place], [place] Phone Number: 11/30/2020, 12:04 PM  Clinical Narrative:    CSW completed very brief SDOH screening with the patient who denied having any needs at this time. Patient reported they do have a PCP and they can get to the pharmacy to pick up their medications. CSW provided the patient with social workers name, number and position and if they identify other needs to please reach out so that CSW can provide support.  2:13pm: CSW spoke with Pam at [facility] [phone] about patient going home with Dobutamine possibly tomorrow or Monday.  CSW will continue to follow for discharge needs.           Expected Discharge Plan and Services                                                 Social Determinants of Health (SDOH) Interventions Food Insecurity Interventions: Intervention Not Indicated Financial Strain Interventions: Intervention Not Indicated Housing Interventions: Intervention Not Indicated Alcohol Brief Interventions/Follow-up: AUDIT Score <7 follow-up not indicated  Readmission Risk Interventions No flowsheet data found.  Jennessa Trigo, MSW, [place] Heart Failure Social Worker

## 2020-11-30 NOTE — Progress Notes (Signed)
   11/29/20 2055  Assess: MEWS Score  Temp 98.3 F (36.8 C)  BP 97/66  Pulse Rate (!) 107  ECG Heart Rate (!) 107  Resp 19  SpO2 100 %  Assess: MEWS Score  MEWS Temp 0  MEWS Systolic 1  MEWS Pulse 1  MEWS RR 0  MEWS LOC 0  MEWS Score 2  MEWS Score Color Yellow  Assess: if the MEWS score is Yellow or Red  Were vital signs taken at a resting state? Yes  Focused Assessment No change from prior assessment  Early Detection of Sepsis Score *See Row Information* Low  MEWS guidelines implemented *See Row Information* No, previously yellow, continue vital signs every 4 hours  Document  Patient Outcome Other (Comment) (stable)  Progress note created (see row info) Yes

## 2020-12-01 LAB — COOXEMETRY PANEL
Carboxyhemoglobin: 1.6 % — ABNORMAL HIGH (ref 0.5–1.5)
Methemoglobin: 1 % (ref 0.0–1.5)
O2 Saturation: 67.8 %
Total hemoglobin: 8.2 g/dL — ABNORMAL LOW (ref 12.0–16.0)

## 2020-12-01 LAB — GLUCOSE, CAPILLARY
Glucose-Capillary: 128 mg/dL — ABNORMAL HIGH (ref 70–99)
Glucose-Capillary: 200 mg/dL — ABNORMAL HIGH (ref 70–99)
Glucose-Capillary: 227 mg/dL — ABNORMAL HIGH (ref 70–99)
Glucose-Capillary: 199 mg/dL — ABNORMAL HIGH (ref 70–99)

## 2020-12-01 LAB — BASIC METABOLIC PANEL
Anion gap: 8 (ref 5–15)
BUN: 45 mg/dL — ABNORMAL HIGH (ref 8–23)
CO2: 28 mmol/L (ref 22–32)
Calcium: 8.6 mg/dL — ABNORMAL LOW (ref 8.9–10.3)
Chloride: 96 mmol/L — ABNORMAL LOW (ref 98–111)
Creatinine, Ser: 2.12 mg/dL — ABNORMAL HIGH (ref 0.61–1.24)
GFR, Estimated: 33 mL/min — ABNORMAL LOW (ref >60)
Glucose, Bld: 199 mg/dL — ABNORMAL HIGH (ref 70–99)
Potassium: 3.5 mmol/L (ref 3.5–5.1)
Sodium: 132 mmol/L — ABNORMAL LOW (ref 135–145)

## 2020-12-01 MED ORDER — POTASSIUM CHLORIDE CRYS ER 20 MEQ PO TBCR
40.0000 meq | EXTENDED_RELEASE_TABLET | Freq: Once | ORAL | Status: AC
  Administered 2020-12-01: 40 meq via ORAL
  Filled 2020-12-01: qty 2

## 2020-12-01 MED ORDER — TORSEMIDE 20 MG PO TABS
60.0000 mg | ORAL_TABLET | Freq: Two times a day (BID) | ORAL | Status: DC
  Administered 2020-12-01 – 2020-12-03 (×5): 60 mg via ORAL
  Filled 2020-12-01 (×5): qty 3

## 2020-12-01 NOTE — Progress Notes (Signed)
CARDIAC REHAB PHASE I   PRE:  Rate/Rhythm: 103 ST  BP:  Supine:   Sitting: 101/63  Standing:    SaO2: 95%RA  MODE:  Ambulation: 370 ft   POST:  Rate/Rhythm: 112 ST  BP:  Supine:   Sitting: 108/71  Standing:    SaO2: 90% and above when pleth good 1335-1422 Pt walked 370 ft on RA pushing IV pole. Tolerated well and denied SOB. Monitored sats whole walk. As long as pleth good, pt remained above 90%. Back to chair after walk. Left on RA but has oxygen available if needed.   Graylon Good, RN BSN  12/01/2020 2:18 PM

## 2020-12-01 NOTE — Progress Notes (Signed)
Mobility Specialist Criteria Algorithm Info.  Mobility Team: [facility] elevated: Activity: Ambulated in hall; Dangled on edge of bed Range of motion: Active; All extremities Level of assistance: Modified independent, requires aide device or extra time Assistive device: None; Other (Comment) (IV Pole) Minutes sitting in chair:  Minutes stood: 5 minutes Minutes ambulated: 5 minutes Distance ambulated (ft): 460 ft Mobility response: Tolerated well (Required standing rest break x1) Bed Position: Semi-fowlers  Patient tolerated ambulation well without incident or complaint. Saturated well on 1LO2 this session, saturating 93% while ambulating.   12/01/2020 9:53 AM

## 2020-12-01 NOTE — Care Management Important Message (Signed)
Important Message  Patient Details  Name: Marc [name] MRN: [id number] Date of Birth: 1951/05/06   Medicare Important Message Given:  Yes - Important Message mailed due to current National Emergency  Verbal consent obtained due to current National Emergency  Relationship to patient: Self Contact Name: Jasaan Call Date: 12/01/20  Time: 1033 Phone: [phone] Outcome: No Answer/Busy Important Message mailed to: Patient address on file      Delorse Lek 12/01/2020, 10:34 AM

## 2020-12-01 NOTE — Progress Notes (Signed)
1000-1028 Pt just walked with Mobility specialist on 1L oxygen. Pt willing to walk again after lunch. Will follow up then. Pt stated he has CHF booklet. Gave low sodium handouts. Discussed importance of daily weights, 2L FR, 2000 mg sodium restriction, and reviewed signs/symptoms of CHF.   Will return this pm. Graylon Good RN BSN 12/01/2020 10:30 AM

## 2020-12-01 NOTE — Progress Notes (Addendum)
Patient ID: Marc [name], male   DOB: [birth year], 70 y.o.   MRN: [id number]    Advanced Heart Failure Rounding Note   Subjective:    - LHC 4/5 showed multivessel CAD. There are targets for PCI, though cMRI showed EF 22% limited viability. -CT C/A/P 11/27/20. No evidence of malignancy  Remains on DBA 4. CO-OX 69%. Wt down 1 lb CVP ~13 but no dyspnea  Scr continues to trend down, 2.5>>2.4>>2.1  Feels well. No complaints today.    Objective:   Weight Range:  Vital Signs:   Temp:  [97.9 F (36.6 C)-98.5 F (36.9 C)] 98.5 F (36.9 C) (04/15 1104) Pulse Rate:  [100-106] 100 (04/15 1104) Resp:  [16-20] 17 (04/15 1104) BP: (97-118)/(63-75) 106/70 (04/15 1104) SpO2:  [95 %-100 %] 97 % (04/15 1104) Weight:  [73.6 kg] 73.6 kg (04/15 0117) Last BM Date: 11/30/20  Weight change: Filed Weights   11/29/20 0551 11/30/20 0018 12/01/20 0117  Weight: 73.6 kg 74 kg 73.6 kg    Intake/Output:   Intake/Output Summary (Last 24 hours) at 12/01/2020 1330 Last data filed at 12/01/2020 1107 Gross per 24 hour  Intake 872.74 ml  Output 1350 ml  Net -477.26 ml   Exam: General:  Well appearing.  No resp difficulty HEENT: normal Neck: supple. JVD ~12 cm Carotids 2+ bilat; no bruits. No lymphadenopathy or thryomegaly appreciated. Cor: PMI nondisplaced. RRR. 3/6 MR murmur at apex   Lungs: clear. No wheezing  Abdomen: soft, nontender, nondistended. No hepatosplenomegaly. No bruits or masses. Good bowel sounds. Extremities: no cyanosis, clubbing, rash, trace edema + bilateral unna boots  Neuro: alert & orientedx3, cranial nerves grossly intact. moves all 4 extremities w/o difficulty. Affect pleasant  Telemetry:  NSR 90s   Personally reviewed   Labs: Basic Metabolic Panel: Recent Labs  Lab 11/27/20 0500 11/28/20 0532 11/29/20 0550 11/30/20 0500 12/01/20 0500  NA 132* 132* 135 130* 132*  K 3.7 3.8 3.7 3.8 3.5  CL 93* 97* 97* 94* 96*  CO2 '28 27 25 27 28  '$ GLUCOSE 229* 130* 133*  162* 199*  BUN 58* 56* 54* 51* 45*  CREATININE 3.31* 2.85* 2.50* 2.35* 2.12*  CALCIUM 8.8* 8.6* 8.6* 8.4* 8.6*  MG  --  1.8  --   --   --     Liver Function Tests: No results for input(s): AST, ALT, ALKPHOS, BILITOT, PROT, ALBUMIN in the last 168 hours. No results for input(s): LIPASE, AMYLASE in the last 168 hours. No results for input(s): AMMONIA in the last 168 hours.  CBC: Recent Labs  Lab 11/26/20 0600 11/27/20 0500 11/27/20 1900 11/28/20 0532 11/29/20 0550 11/30/20 0500  WBC 6.5 5.8  --  7.3 7.1 6.9  HGB 7.7* 7.3* 8.7* 8.7* 8.4* 8.1*  HCT 24.2* 23.5* 26.8* 27.4* 26.9* 25.9*  MCV 79.3* 80.5  --  80.6 82.3 83.8  PLT 221 241  --  265 299 298    Cardiac Enzymes: No results for input(s): CKTOTAL, CKMB, CKMBINDEX, TROPONINI in the last 168 hours.  BNP: BNP (last 3 results) Recent Labs    11/11/20 2158  BNP 1,728.5*    ProBNP (last 3 results) Recent Labs    10/24/20 1119  PROBNP 1,685*      Other results:  Imaging: No results found.   Medications:     Scheduled Medications: . sodium chloride   Intravenous Once  . aspirin EC  81 mg Oral Daily  . atorvastatin  40 mg Oral Daily  . calcium carbonate  1 tablet Oral BID WC  . Chlorhexidine Gluconate Cloth  6 each Topical Daily  . enoxaparin (LOVENOX) injection  30 mg Subcutaneous Q24H  . ezetimibe  10 mg Oral Daily  . fluticasone  2 spray Each Nare BID  . insulin aspart  0-15 Units Subcutaneous TID AC & HS  . multivitamin with minerals  1 tablet Oral Daily  . omega-3 acid ethyl esters  1 g Oral Daily  . pantoprazole  40 mg Oral Daily  . polyethylene glycol  17 g Oral Daily  . sodium chloride flush  10-40 mL Intracatheter Q12H  . zolpidem  5 mg Oral Once    Infusions: . sodium chloride    . DOBUTamine 4 mcg/kg/min (12/01/20 0246)  . furosemide 120 mg (12/01/20 0820)    PRN Medications: sodium chloride, acetaminophen, calcium carbonate, guaiFENesin-dextromethorphan, ondansetron (ZOFRAN) IV,  sodium chloride flush, sodium chloride flush   Assessment/Plan:   1. A/C Systolic Heart Failure,  ICM  - ECHO 2019 35-40%  - ECHO EF 20-25% RV normal Mod -Severe MR.  - RHC 11/16/20 - PCWP 25, RA 8, PA sat 46%   - BB and ARNI held due to hypotension.  - Intolerant SGT2i with yeast infections.  - No digoxin with AKI   - cMRI 11/20/20 EF 22% with mild-mod RV dysfunction. Diffuse LGE suggestive of severe ischemic CM. Little viable myocardium - In looking at MRI, Suspect this is end-stage ischemic CM with low output. Cath w/ severe MVCAD but given limited viability on cMRI not sure revascularization will give him durable benefit. Ideally, would get 2v PCI and MitraClip at some point but with dramatic CIN likely not an option in the near future even if renal function recovers over the next week given high risk of recurrent CIN. LVAD not option right now with AKI.  - Now on dobutamine to 4. Co-ox 68%.  - Renal function improving. Remains volume overloaded. Will continue to diurese one more day. Likely to PO tomorrow  - Continue unna boots.   2. Chronic Anemia, iron deficient  - EGD 2022 - normal. No recent colonoscopy - FOBT negative - Received feraheme this admit.  - SPEP negative -  EPO and LDH ok - 4/3 Given 1UPRBCs. 4/11 given 1u RBCs - Hgb 7.3 -> 8.7 -> 8.4 ->8.4 -> 8.1-> No obvious source of bleeding - On protonix.  - Will likely need colonoscopy eventually but may be too tenuous. CT C/A/P (no contrast on 4/11 no evidence of malignancy   3. CAD - Anterior MI 1997 PTCA LAD distal circ and 1st diagonal DES 2011 - LHC this admit w/ severe MVCAD  - There are targets for PCI in OM and RCA but risk of recurrent CIN very high. Will defer for now. May be better to let kidneys rest for several weeks and re-evaluating - No current angina - continue ASA + statin + Zetia   4. AKI on CKD Stage IIIb - Creatinine baseline 1.6-1.8 , creatinine 1.66>>1.95>>3.23>3.9>4.05 -> 3.6 -> 3.3 -> 2.8 ->  2.5->2.35->2.12. Suspect contrast nephropathy.  Continue DBA - Renal function continues to improve. Will await to see new baseline. Have decided to avoid repeat contrast exposure for at least a few weeks given severity of AKI with diagnostic cath only  - Appreciate Renal input.   5. Severe MR - this is functional.  LV not markedly dilated with LVEDD 5.8 cm, may be Mitraclip candidate but awaiting renal recovery.   Long discussion about next steps. Will try  to get him home in the next couple of days with home doubtamine for a period of stabilization before reassess next step. High-risk VAD or Hospice looking like they may be only options. Awaiting home health arrangement.   Length of Stay: [place], PA-C  1:30 PM  Patient seen and examined with the above-signed Advanced Practice Provider and/or Housestaff. I personally reviewed laboratory data, imaging studies and relevant notes. I independently examined the patient and formulated the important aspects of the plan. I have edited the note to reflect any of my changes or salient points. I have personally discussed the plan with the patient and/or family.  Feels better today. Remains on IV lasix and DBA 4. Co-ox 67%. Creatinine continues to trend down   General:  Sitting in chair  No resp difficulty HEENT: normal Neck: supple. JVP to jaw Carotids 2+ bilat; no bruits. No lymphadenopathy or thryomegaly appreciated. Cor: PMI nondisplaced. Regular rate & rhythm. No rubs, gallops or murmurs. Lungs: clear Abdomen: soft, nontender, nondistended. No hepatosplenomegaly. No bruits or masses. Good bowel sounds. Extremities: no cyanosis, clubbing, rash, tr-1+ edema Neuro: alert & orientedx3, cranial nerves grossly intact. moves all 4 extremities w/o difficulty. Affect pleasant  Remains inotrope dependent. Continue DBA and IV lasix. Await creatinine nadir. Will switch to po torsemide today and follow. Likely home with DBA.   Glori Bickers, MD  5:22 PM

## 2020-12-01 NOTE — TOC Progression Note (Signed)
Transition of Care (TOC) - Progression Note  Heart Failure   Patient Details  Name: DRAKKAR SERGIO MRN: [id number] Date of Birth: 03/05/1951  Transition of Care [facility]) CM/SW [place], [place] Phone Number: 12/01/2020, 3:20 PM  Clinical Narrative:    CSW spoke with Jeannene Patella at [facility] [phone] regarding Mr. Corvino needing home health RN since [place] doesn't take health team advantage. CSW reaching out to home health agencies for [facility] for the IV antibiotics.  TOC will continue to follow for d/c needs.         Expected Discharge Plan and Services                                                 Social Determinants of Health (SDOH) Interventions Food Insecurity Interventions: Intervention Not Indicated Financial Strain Interventions: Intervention Not Indicated Housing Interventions: Intervention Not Indicated Transportation Interventions: Intervention Not Indicated Alcohol Brief Interventions/Follow-up: AUDIT Score <7 follow-up not indicated  Readmission Risk Interventions No flowsheet data found.  Ellawyn Wogan, MSW, [place] Heart Failure Social Worker

## 2020-12-02 LAB — BASIC METABOLIC PANEL
Anion gap: 9 (ref 5–15)
BUN: 50 mg/dL — ABNORMAL HIGH (ref 8–23)
CO2: 28 mmol/L (ref 22–32)
Calcium: 8.8 mg/dL — ABNORMAL LOW (ref 8.9–10.3)
Chloride: 99 mmol/L (ref 98–111)
Creatinine, Ser: 2.08 mg/dL — ABNORMAL HIGH (ref 0.61–1.24)
GFR, Estimated: 34 mL/min — ABNORMAL LOW (ref >60)
Glucose, Bld: 131 mg/dL — ABNORMAL HIGH (ref 70–99)
Potassium: 3.8 mmol/L (ref 3.5–5.1)
Sodium: 136 mmol/L (ref 135–145)

## 2020-12-02 LAB — CBC
HCT: 26.7 % — ABNORMAL LOW (ref 39.0–52.0)
Hemoglobin: 8.3 g/dL — ABNORMAL LOW (ref 13.0–17.0)
MCH: 26 pg (ref 26.0–34.0)
MCHC: 31.1 g/dL (ref 30.0–36.0)
MCV: 83.7 fL (ref 80.0–100.0)
Platelets: 317 10*3/uL (ref 150–400)
RBC: 3.19 MIL/uL — ABNORMAL LOW (ref 4.22–5.81)
RDW: 19.5 % — ABNORMAL HIGH (ref 11.5–15.5)
WBC: 7.6 10*3/uL (ref 4.0–10.5)
nRBC: 0 % (ref 0.0–0.2)

## 2020-12-02 LAB — GLUCOSE, CAPILLARY
Glucose-Capillary: 157 mg/dL — ABNORMAL HIGH (ref 70–99)
Glucose-Capillary: 245 mg/dL — ABNORMAL HIGH (ref 70–99)
Glucose-Capillary: 247 mg/dL — ABNORMAL HIGH (ref 70–99)
Glucose-Capillary: 221 mg/dL — ABNORMAL HIGH (ref 70–99)

## 2020-12-02 LAB — COOXEMETRY PANEL
Carboxyhemoglobin: 1.5 % (ref 0.5–1.5)
Methemoglobin: 0.6 % (ref 0.0–1.5)
O2 Saturation: 54.5 %
Total hemoglobin: 8.5 g/dL — ABNORMAL LOW (ref 12.0–16.0)

## 2020-12-02 NOTE — Progress Notes (Signed)
CARDIAC REHAB PHASE I   PRE:  Rate/Rhythm: 102 ST    BP: sitting 101/76    SaO2: 100 2L, 100 RA  MODE:  Ambulation: 400 ft   POST:  Rate/Rhythm:  105 ST                                                                                                                                                                                                                                                                            BP: sitting 105/79     SaO2: 100 RA [id number] Patient ambulated in hallway pushing IV pole. O2 turned off to check ambulatory sats. When pleth was WNL, RA sats 97%. 2 standing rest breaks. Denies shortness of breath, chest discomfort, fatigue. Patient admits to lack of sleep last night. Post ambulation patient back to chair with call bell and phone in reach. Patient encouraged to ambulate in hallway as tolerated.    Tomia Enlow Minus Breeding RN, BSN

## 2020-12-02 NOTE — Plan of Care (Signed)
  Problem: Education: Goal: Ability to demonstrate management of disease process will improve Outcome: Progressing   Problem: Education: Goal: Ability to verbalize understanding of medication therapies will improve Outcome: Progressing   Problem: Activity: Goal: Capacity to carry out activities will improve Outcome: Progressing   Problem: Cardiac: Goal: Ability to achieve and maintain adequate cardiopulmonary perfusion will improve Outcome: Progressing   Problem: Clinical Measurements: Goal: Respiratory complications will improve Outcome: Progressing

## 2020-12-02 NOTE — Plan of Care (Signed)
?  Problem: Education: ?Goal: Ability to demonstrate management of disease process will improve ?Outcome: Progressing ?  ?Problem: Education: ?Goal: Ability to verbalize understanding of medication therapies will improve ?Outcome: Progressing ?  ?Problem: Cardiac: ?Goal: Ability to achieve and maintain adequate cardiopulmonary perfusion will improve ?Outcome: Progressing ?  ?

## 2020-12-02 NOTE — Progress Notes (Signed)
Patient ID: Marc [name], male   DOB: [birth year], 70 y.o.   MRN: [id number]    Advanced Heart Failure Rounding Note   Subjective:    - LHC 4/5 showed multivessel CAD. There are targets for PCI, though cMRI showed EF 22% limited viability. -CT C/A/P 11/27/20. No evidence of malignancy  Remains on DBA 4. CO-OX 55%. Wt down 1 lb CVP 10-11  Scr continues to trend down, 2.5>>2.4>>2.1 -> 2.08  Feels ok. Denies CP, SOB, orthopnea or PND   Objective:   Weight Range:  Vital Signs:   Temp:  [97.7 F (36.5 C)-98.8 F (37.1 C)] 97.7 F (36.5 C) (04/16 0716) Pulse Rate:  [102-108] 102 (04/16 0716) Resp:  [15-20] 20 (04/16 0716) BP: (90-107)/(61-74) 106/74 (04/16 0716) SpO2:  [94 %-100 %] 100 % (04/16 0716) Weight:  [73.3 kg] 73.3 kg (04/16 0112) Last BM Date: 12/01/20  Weight change: Filed Weights   11/30/20 0018 12/01/20 0117 12/02/20 0112  Weight: 74 kg 73.6 kg 73.3 kg    Intake/Output:   Intake/Output Summary (Last 24 hours) at 12/02/2020 1132 Last data filed at 12/02/2020 1029 Gross per 24 hour  Intake 1512.28 ml  Output 2275 ml  Net -762.72 ml   Exam: General:  Sitting in chair No resp difficulty HEENT: normal Neck: supple.JVP 10 Carotids 2+ bilat; no bruits. No lymphadenopathy or thryomegaly appreciated. Cor: PMI nondisplaced. Regular rate & rhythm. 2/6 MR Lungs: clear Abdomen: soft, nontender, nondistended. No hepatosplenomegaly. No bruits or masses. Good bowel sounds. Extremities: no cyanosis, clubbing, rash, trace edema + UNNA Neuro: alert & orientedx3, cranial nerves grossly intact. moves all 4 extremities w/o difficulty. Affect pleasant   Telemetry:  Sinus 95-110 Personally reviewed   Labs: Basic Metabolic Panel: Recent Labs  Lab 11/28/20 0532 11/29/20 0550 11/30/20 0500 12/01/20 0500 12/02/20 0351  NA 132* 135 130* 132* 136  K 3.8 3.7 3.8 3.5 3.8  CL 97* 97* 94* 96* 99  CO2 '27 25 27 28 28  '$ GLUCOSE 130* 133* 162* 199* 131*  BUN 56* 54* 51*  45* 50*  CREATININE 2.85* 2.50* 2.35* 2.12* 2.08*  CALCIUM 8.6* 8.6* 8.4* 8.6* 8.8*  MG 1.8  --   --   --   --     Liver Function Tests: No results for input(s): AST, ALT, ALKPHOS, BILITOT, PROT, ALBUMIN in the last 168 hours. No results for input(s): LIPASE, AMYLASE in the last 168 hours. No results for input(s): AMMONIA in the last 168 hours.  CBC: Recent Labs  Lab 11/27/20 0500 11/27/20 1900 11/28/20 0532 11/29/20 0550 11/30/20 0500 12/02/20 0351  WBC 5.8  --  7.3 7.1 6.9 7.6  HGB 7.3* 8.7* 8.7* 8.4* 8.1* 8.3*  HCT 23.5* 26.8* 27.4* 26.9* 25.9* 26.7*  MCV 80.5  --  80.6 82.3 83.8 83.7  PLT 241  --  265 299 298 317    Cardiac Enzymes: No results for input(s): CKTOTAL, CKMB, CKMBINDEX, TROPONINI in the last 168 hours.  BNP: BNP (last 3 results) Recent Labs    11/11/20 2158  BNP 1,728.5*    ProBNP (last 3 results) Recent Labs    10/24/20 1119  PROBNP 1,685*      Other results:  Imaging: No results found.   Medications:     Scheduled Medications: . sodium chloride   Intravenous Once  . aspirin EC  81 mg Oral Daily  . atorvastatin  40 mg Oral Daily  . calcium carbonate  1 tablet Oral BID WC  . Chlorhexidine Gluconate  Cloth  6 each Topical Daily  . enoxaparin (LOVENOX) injection  30 mg Subcutaneous Q24H  . ezetimibe  10 mg Oral Daily  . fluticasone  2 spray Each Nare BID  . insulin aspart  0-15 Units Subcutaneous TID AC & HS  . multivitamin with minerals  1 tablet Oral Daily  . omega-3 acid ethyl esters  1 g Oral Daily  . pantoprazole  40 mg Oral Daily  . polyethylene glycol  17 g Oral Daily  . sodium chloride flush  10-40 mL Intracatheter Q12H  . torsemide  60 mg Oral BID  . zolpidem  5 mg Oral Once    Infusions: . sodium chloride    . DOBUTamine 4 mcg/kg/min (12/02/20 0347)    PRN Medications: sodium chloride, acetaminophen, calcium carbonate, guaiFENesin-dextromethorphan, ondansetron (ZOFRAN) IV, sodium chloride flush, sodium chloride  flush   Assessment/Plan:   1. A/C Systolic Heart Failure,  ICM  - ECHO 2019 35-40%  - ECHO EF 20-25% RV normal Mod -Severe MR.  - RHC 11/16/20 - PCWP 25, RA 8, PA sat 46%   - BB and ARNI held due to hypotension.  - Intolerant SGT2i with yeast infections.  - No digoxin with AKI   - cMRI 11/20/20 EF 22% with mild-mod RV dysfunction. Diffuse LGE suggestive of severe ischemic CM. Little viable myocardium - In looking at MRI, Suspect this is end-stage ischemic CM with low output. Cath w/ severe MV CAD but given limited viability on cMRI not sure revascularization will give him durable benefit. Ideally, would get 2v PCI and MitraClip at some point but with dramatic CIN likely not an option. LVAD not option right now with AKI.  - Now on dobutamine to 4. Co-ox 55%.  - Renal function has stabilized. Volume status improved.  - Continue DBA and torsemide. Will need home DBA - Continue unna boots.   2. Chronic Anemia, iron deficient  - EGD 2022 - normal. No recent colonoscopy - FOBT negative - Received feraheme this admit.  - SPEP negative -  EPO and LDH ok - 4/3 Given 1UPRBCs. 4/11 given 1u RBCs - Hgb 7.3 -> 8.7 -> 8.4 ->8.4 -> 8.1-> 8.3. No obvious source of bleeding - On protonix.  - Ideally would get colonoscopy eventually but lieklytoo tenuous. CT C/A/P (no contrast on 4/11 no evidence of malignancy   3. CAD - Anterior MI 1997 PTCA LAD distal circ and 1st diagonal DES 2011 - LHC this admit w/ severe MVCAD  - There are targets for PCI in OM and RCA but risk of recurrent CIN very high. Will defer for now. May be better to let kidneys rest for several weeks and re-evaluating - No current angina - continue ASA + statin + Zetia   4. AKI on CKD Stage IIIb - Creatinine baseline 1.6-1.8 , creatinine 1.66>>1.95>>3.23>3.9>4.05 -> 3.6 -> 3.3 -> 2.8 -> 2.5->2.35->2.12 -> 2.08. Suspect contrast nephropathy.  Continue DBA - Renal function seems to be settling out at new baseline. Have decided to avoid  repeat contrast exposure for at least a few weeks given severity of AKI with diagnostic cath only  - Appreciate Renal input.   5. Severe MR - this is functional.  LV not markedly dilated with LVEDD 5.8 cm, may be Mitraclip candidate but awaiting renal recovery.   Long discussion about next steps. Will try to get him home in the next couple of days with home doubtamine for a period of stabilization before reassess next step. High-risk VAD or Hospice looking  like they may be only options. Awaiting home health arrangement.   Length of Stay: 21  Glori Bickers, MD  11:32 AM

## 2020-12-03 LAB — BASIC METABOLIC PANEL
Anion gap: 10 (ref 5–15)
BUN: 45 mg/dL — ABNORMAL HIGH (ref 8–23)
CO2: 28 mmol/L (ref 22–32)
Calcium: 8.6 mg/dL — ABNORMAL LOW (ref 8.9–10.3)
Chloride: 100 mmol/L (ref 98–111)
Creatinine, Ser: 2.02 mg/dL — ABNORMAL HIGH (ref 0.61–1.24)
GFR, Estimated: 35 mL/min — ABNORMAL LOW (ref >60)
Glucose, Bld: 144 mg/dL — ABNORMAL HIGH (ref 70–99)
Potassium: 3.3 mmol/L — ABNORMAL LOW (ref 3.5–5.1)
Sodium: 138 mmol/L (ref 135–145)

## 2020-12-03 LAB — GLUCOSE, CAPILLARY
Glucose-Capillary: 152 mg/dL — ABNORMAL HIGH (ref 70–99)
Glucose-Capillary: 132 mg/dL — ABNORMAL HIGH (ref 70–99)
Glucose-Capillary: 276 mg/dL — ABNORMAL HIGH (ref 70–99)
Glucose-Capillary: 177 mg/dL — ABNORMAL HIGH (ref 70–99)
Glucose-Capillary: 180 mg/dL — ABNORMAL HIGH (ref 70–99)

## 2020-12-03 LAB — COOXEMETRY PANEL
Carboxyhemoglobin: 1.6 % — ABNORMAL HIGH (ref 0.5–1.5)
Methemoglobin: 0.7 % (ref 0.0–1.5)
O2 Saturation: 59.9 %
Total hemoglobin: 8.4 g/dL — ABNORMAL LOW (ref 12.0–16.0)

## 2020-12-03 LAB — CBC
HCT: 26.2 % — ABNORMAL LOW (ref 39.0–52.0)
Hemoglobin: 8.1 g/dL — ABNORMAL LOW (ref 13.0–17.0)
MCH: 26.1 pg (ref 26.0–34.0)
MCHC: 30.9 g/dL (ref 30.0–36.0)
MCV: 84.5 fL (ref 80.0–100.0)
Platelets: 314 10*3/uL (ref 150–400)
RBC: 3.1 MIL/uL — ABNORMAL LOW (ref 4.22–5.81)
RDW: 19.7 % — ABNORMAL HIGH (ref 11.5–15.5)
WBC: 7.8 10*3/uL (ref 4.0–10.5)
nRBC: 0 % (ref 0.0–0.2)

## 2020-12-03 MED ORDER — POTASSIUM CHLORIDE CRYS ER 20 MEQ PO TBCR
40.0000 meq | EXTENDED_RELEASE_TABLET | Freq: Once | ORAL | Status: AC
  Administered 2020-12-03: 40 meq via ORAL
  Filled 2020-12-03: qty 2

## 2020-12-03 NOTE — Progress Notes (Signed)
Patient ID: Marc [name], male   DOB: 03/24/1951, 70 y.o.   MRN: [id number]    Advanced Heart Failure Rounding Note   Subjective:    - LHC 4/5 showed multivessel CAD. There are targets for PCI, though cMRI showed EF 22% limited viability. -CT C/A/P 11/27/20. No evidence of malignancy  Remains on DBA 4. CO-OX 66%. Wt down 2 lb CVP 10  Scr 2.5>>2.4>>2.1 -> 2.08 -> 2.02  Feels ok. No CP, SOB, orthopnea or PND.   Objective:   Weight Range:  Vital Signs:   Temp:  [97.7 F (36.5 C)-98.7 F (37.1 C)] 98.1 F (36.7 C) (04/17 0345) Pulse Rate:  [99-107] 105 (04/17 0345) Resp:  [12-24] 23 (04/17 0345) BP: (98-108)/(68-76) 106/71 (04/17 0345) SpO2:  [92 %-100 %] 92 % (04/17 0345) Weight:  [72.5 kg] 72.5 kg (04/17 0345) Last BM Date: 12/02/20  Weight change: Filed Weights   12/01/20 0117 12/02/20 0112 12/03/20 0345  Weight: 73.6 kg 73.3 kg 72.5 kg    Intake/Output:   Intake/Output Summary (Last 24 hours) at 12/03/2020 0826 Last data filed at 12/03/2020 0717 Gross per 24 hour  Intake 1162.27 ml  Output 2500 ml  Net -1337.73 ml   Exam: General:  Sitting in chair No resp difficulty General:  Well appearing. No resp difficulty HEENT: normal Neck: supple JVP 10 Carotids 2+ bilat; no bruits. No lymphadenopathy or thryomegaly appreciated. Cor: PMI nondisplaced. Tachy regular +s3  Lungs: clear Abdomen: soft, nontender, nondistended. No hepatosplenomegaly. No bruits or masses. Good bowel sounds. Extremities: no cyanosis, clubbing, rash, tr edema Neuro: alert & orientedx3, cranial nerves grossly intact. moves all 4 extremities w/o difficulty. Affect pleasant   Telemetry:  Sinus 100-110 Personally reviewed   Labs: Basic Metabolic Panel: Recent Labs  Lab 11/28/20 0532 11/29/20 0550 11/30/20 0500 12/01/20 0500 12/02/20 0351 12/03/20 0330  NA 132* 135 130* 132* 136 138  K 3.8 3.7 3.8 3.5 3.8 3.3*  CL 97* 97* 94* 96* 99 100  CO2 '27 25 27 28 28 28  '$ GLUCOSE 130*  133* 162* 199* 131* 144*  BUN 56* 54* 51* 45* 50* 45*  CREATININE 2.85* 2.50* 2.35* 2.12* 2.08* 2.02*  CALCIUM 8.6* 8.6* 8.4* 8.6* 8.8* 8.6*  MG 1.8  --   --   --   --   --     Liver Function Tests: No results for input(s): AST, ALT, ALKPHOS, BILITOT, PROT, ALBUMIN in the last 168 hours. No results for input(s): LIPASE, AMYLASE in the last 168 hours. No results for input(s): AMMONIA in the last 168 hours.  CBC: Recent Labs  Lab 11/28/20 0532 11/29/20 0550 11/30/20 0500 12/02/20 0351 12/03/20 0330  WBC 7.3 7.1 6.9 7.6 7.8  HGB 8.7* 8.4* 8.1* 8.3* 8.1*  HCT 27.4* 26.9* 25.9* 26.7* 26.2*  MCV 80.6 82.3 83.8 83.7 84.5  PLT 265 299 298 317 314    Cardiac Enzymes: No results for input(s): CKTOTAL, CKMB, CKMBINDEX, TROPONINI in the last 168 hours.  BNP: BNP (last 3 results) Recent Labs    11/11/20 2158  BNP 1,728.5*    ProBNP (last 3 results) Recent Labs    10/24/20 1119  PROBNP 1,685*      Other results:  Imaging: No results found.   Medications:     Scheduled Medications: . sodium chloride   Intravenous Once  . aspirin EC  81 mg Oral Daily  . atorvastatin  40 mg Oral Daily  . calcium carbonate  1 tablet Oral BID WC  .  Chlorhexidine Gluconate Cloth  6 each Topical Daily  . enoxaparin (LOVENOX) injection  30 mg Subcutaneous Q24H  . ezetimibe  10 mg Oral Daily  . fluticasone  2 spray Each Nare BID  . insulin aspart  0-15 Units Subcutaneous TID AC & HS  . multivitamin with minerals  1 tablet Oral Daily  . omega-3 acid ethyl esters  1 g Oral Daily  . pantoprazole  40 mg Oral Daily  . polyethylene glycol  17 g Oral Daily  . sodium chloride flush  10-40 mL Intracatheter Q12H  . torsemide  60 mg Oral BID  . zolpidem  5 mg Oral Once    Infusions: . sodium chloride    . DOBUTamine 4 mcg/kg/min (12/03/20 0622)    PRN Medications: sodium chloride, acetaminophen, calcium carbonate, guaiFENesin-dextromethorphan, ondansetron (ZOFRAN) IV, sodium chloride  flush, sodium chloride flush   Assessment/Plan:   1. A/C Systolic Heart Failure,  ICM  - ECHO 2019 35-40%  - ECHO EF 20-25% RV normal Mod -Severe MR.  - RHC 11/16/20 - PCWP 25, RA 8, PA sat 46%   - BB and ARNI held due to hypotension.  - Intolerant SGT2i with yeast infections.  - No digoxin with AKI   - cMRI 11/20/20 EF 22% with mild-mod RV dysfunction. Diffuse LGE suggestive of severe ischemic CM. Little viable myocardium - In looking at MRI, Suspect this is end-stage ischemic CM with low output. Cath w/ severe MV CAD but given limited viability on cMRI not sure revascularization will give him durable benefit. Ideally, would get 2v PCI and MitraClip at some point but with dramatic CIN likely not an option. LVAD not option right now with AKI.  - Now on dobutamine to 4. Co-ox 60%.  - Renal function has stabilized. Volume status ok  - Continue DBA and torsemide. Will need home DBA. I spoke with Meryle Ready with [facility] and should be ready to go in am  - Continue unna boots.   2. Chronic Anemia, iron deficient  - EGD 2022 - normal. No recent colonoscopy - FOBT negative - Received feraheme this admit.  - SPEP negative -  EPO and LDH ok - 4/3 Given 1UPRBCs. 4/11 given 1u RBCs - Hgb 7.3 -> 8.7 -> 8.4 ->8.4 -> 8.1-> 8.3 -> 8.1. No obvious source of bleeding - On protonix.  - Ideally would get colonoscopy eventually but liekly too tenuous. CT C/A/P (no contrast on 4/11 no evidence of malignancy   3. CAD - Anterior MI 1997 PTCA LAD distal circ and 1st diagonal DES 2011 - LHC this admit w/ severe MVCAD  - There are targets for PCI in OM and RCA but risk of recurrent CIN very high. Will defer for now. May be better to let kidneys rest for several weeks and re-evaluating - No current angina - continue ASA + statin + Zetia   4. AKI on CKD Stage IIIb - Creatinine baseline 1.6-1.8 , creatinine 1.66>>1.95>>3.23>3.9>4.05 -> 3.6 -> 3.3 -> 2.8 -> 2.5->2.35->2.12 -> 2.08 -> 2.02. Suspect contrast  nephropathy.  Continue DBA for support  - Renal function seems to be settling out at new baseline. Have decided to avoid repeat contrast exposure for at least a few weeks given severity of AKI with diagnostic cath only  - Appreciate Renal input.   5. Severe MR - this is functional.  LV not markedly dilated with LVEDD 5.8 cm, may be Mitraclip candidate but awaiting renal recovery.   Long discussion about next steps earlier this week.  Will try to get him home tomorrow with home doubtamine for a period of stabilization before reassess next step. High-risk VAD or Hospice looking like they may be only options. Home health arrangements to be finalized in am    Length of Stay: 22  Glori Bickers, MD  8:26 AM

## 2020-12-04 LAB — BASIC METABOLIC PANEL
Anion gap: 9 (ref 5–15)
BUN: 40 mg/dL — ABNORMAL HIGH (ref 8–23)
CO2: 28 mmol/L (ref 22–32)
Calcium: 8.9 mg/dL (ref 8.9–10.3)
Chloride: 101 mmol/L (ref 98–111)
Creatinine, Ser: 1.99 mg/dL — ABNORMAL HIGH (ref 0.61–1.24)
GFR, Estimated: 36 mL/min — ABNORMAL LOW (ref >60)
Glucose, Bld: 129 mg/dL — ABNORMAL HIGH (ref 70–99)
Potassium: 3.5 mmol/L (ref 3.5–5.1)
Sodium: 138 mmol/L (ref 135–145)

## 2020-12-04 LAB — COOXEMETRY PANEL
Carboxyhemoglobin: 1.5 % (ref 0.5–1.5)
Methemoglobin: 1 % (ref 0.0–1.5)
O2 Saturation: 50.1 %
Total hemoglobin: 8.6 g/dL — ABNORMAL LOW (ref 12.0–16.0)

## 2020-12-04 LAB — GLUCOSE, CAPILLARY
Glucose-Capillary: 143 mg/dL — ABNORMAL HIGH (ref 70–99)
Glucose-Capillary: 257 mg/dL — ABNORMAL HIGH (ref 70–99)
Glucose-Capillary: 208 mg/dL — ABNORMAL HIGH (ref 70–99)
Glucose-Capillary: 179 mg/dL — ABNORMAL HIGH (ref 70–99)

## 2020-12-04 MED ORDER — FUROSEMIDE 10 MG/ML IJ SOLN
80.0000 mg | Freq: Two times a day (BID) | INTRAMUSCULAR | Status: DC
  Administered 2020-12-04 – 2020-12-05 (×3): 80 mg via INTRAVENOUS
  Filled 2020-12-04 (×3): qty 8

## 2020-12-04 MED ORDER — ALTEPLASE 2 MG IJ SOLR
2.0000 mg | Freq: Once | INTRAMUSCULAR | Status: AC
  Administered 2020-12-04: 2 mg
  Filled 2020-12-04: qty 2

## 2020-12-04 MED ORDER — METOLAZONE 2.5 MG PO TABS
2.5000 mg | ORAL_TABLET | Freq: Once | ORAL | Status: AC
  Administered 2020-12-04: 2.5 mg via ORAL
  Filled 2020-12-04: qty 1

## 2020-12-04 MED ORDER — POTASSIUM CHLORIDE CRYS ER 20 MEQ PO TBCR
40.0000 meq | EXTENDED_RELEASE_TABLET | Freq: Four times a day (QID) | ORAL | Status: AC
  Administered 2020-12-04 (×2): 40 meq via ORAL
  Filled 2020-12-04 (×2): qty 2

## 2020-12-04 NOTE — Plan of Care (Signed)
  Problem: Education: Goal: Ability to demonstrate management of disease process will improve Outcome: Progressing Goal: Ability to verbalize understanding of medication therapies will improve Outcome: Progressing Goal: Individualized Educational Video(s) Outcome: Progressing   Problem: Activity: Goal: Capacity to carry out activities will improve Outcome: Progressing   Problem: Cardiac: Goal: Ability to achieve and maintain adequate cardiopulmonary perfusion will improve Outcome: Progressing   Problem: Education: Goal: Knowledge of General Education information will improve Description: Including pain rating scale, medication(s)/side effects and non-pharmacologic comfort measures Outcome: Progressing   Problem: Health Behavior/Discharge Planning: Goal: Ability to manage health-related needs will improve Outcome: Progressing   Problem: Clinical Measurements: Goal: Ability to maintain clinical measurements within normal limits will improve Outcome: Progressing Goal: Diagnostic test results will improve Outcome: Progressing Goal: Respiratory complications will improve Outcome: Progressing Goal: Cardiovascular complication will be avoided Outcome: Progressing   Problem: Coping: Goal: Level of anxiety will decrease Outcome: Progressing

## 2020-12-04 NOTE — TOC Progression Note (Signed)
Transition of Care (TOC) - Progression Note  Heart Failure   Patient Details  Name: KASEM HAFELE MRN: [id number] Date of Birth: Jan 16, 1951  Transition of Care [facility]) CM/SW [place], [place] Phone Number: 12/04/2020, 10:03 AM  Clinical Narrative:    CSW has contacted every [place] to see about getting the patient home nursing for the dobutamine however all 12 HH agencies have denied the patients payer source/health insurance. CSW sent a message to all [facility] staff asking for help pairing with a better [place] source with no luck at this time.   TOC will continue to follow for d/c needs.         Expected Discharge Plan and Services                                                 Social Determinants of Health (SDOH) Interventions Food Insecurity Interventions: Intervention Not Indicated Financial Strain Interventions: Intervention Not Indicated Housing Interventions: Intervention Not Indicated Transportation Interventions: Intervention Not Indicated Alcohol Brief Interventions/Follow-up: AUDIT Score <7 follow-up not indicated  Readmission Risk Interventions No flowsheet data found.  Mykle Pascua, MSW, [place] Heart Failure Social Worker

## 2020-12-04 NOTE — Progress Notes (Signed)
  Mobility Specialist Criteria Algorithm Info.  SATURATION QUALIFICATIONS: (This note is used to comply with regulatory documentation for home oxygen)  Patient Saturations on Room Air at Rest = 100%  Patient Saturations on Room Air while Ambulating = 96%  Patient Saturations on 0 Liters of oxygen while Ambulating = n/a   Please briefly explain why patient needs home oxygen:  Mobility Team: [facility] elevated:Self regulated Activity: Ambulated in hall Range of motion: Active; All extremities Level of assistance: Modified independent, requires aide device or extra time Assistive device: Other (Comment) (IV Pole) Minutes sitting in chair:  Minutes stood: 6 minutes Minutes ambulated: 6 minutes Distance ambulated (ft): 480 ft Mobility response: Tolerated well (standing rest x1) Bed Position: Semi-fowlers  Patient ambulated full length of hallway today on RA, saturating 95-96% throughout. Tolerated ambulation well without complaint or incident and is now lying supine in bed with all needs met. .   12/04/2020 10:10 AM

## 2020-12-04 NOTE — Progress Notes (Signed)
[id number] Came earlier and pt walking in hallway with Mobility specialist and doing well on RA. Came to see pt at this time and eating lunch. Offered to come back later but pt needing a little time to himself. Having some [place] discharge issues. Emotional support given. Encouraged to walk with staff later this evening if feeling up to it. Will continue to follow.Graylon Good RN BSN 12/04/2020 1:34 PM

## 2020-12-04 NOTE — Progress Notes (Addendum)
Patient ID: JYRESE PACKWOOD, male   DOB: 02/21/1951, 70 y.o.   MRN: [id number]    Advanced Heart Failure Rounding Note   Subjective:    -LHC 4/5 showed multivessel CAD. There are targets for PCI, though cMRI showed EF 22% limited viability. -CT C/A/P 11/27/20. No evidence of malignancy  Remains on DBA 4. No Co-ox drawn today.   Wt down 2 lb from yesterday. CVP 11-12   Scr 2.5>>2.4>>2.1 -> 2.08 -> 2.02-->1.99    Feels ok. No complaints today.    Objective:   Weight Range:  Vital Signs:   Temp:  [97.9 F (36.6 C)-98.6 F (37 C)] 98.4 F (36.9 C) (04/18 0711) Pulse Rate:  [101-105] 105 (04/18 0711) Resp:  [16-21] 17 (04/18 0711) BP: (96-109)/(64-73) 100/65 (04/18 0711) SpO2:  [93 %-99 %] 99 % (04/18 0711) Last BM Date: 12/02/20  Weight change: Filed Weights   12/01/20 0117 12/02/20 0112 12/03/20 0345  Weight: 73.6 kg 73.3 kg 72.5 kg    Intake/Output:   Intake/Output Summary (Last 24 hours) at 12/04/2020 1048 Last data filed at 12/04/2020 0955 Gross per 24 hour  Intake 820 ml  Output 2325 ml  Net -1505 ml   PHYSICAL EXAM: CVP 11-12  General:  Well appearing. No respiratory difficulty HEENT: normal Neck: supple. JVD 12 cm. Carotids 2+ bilat; no bruits. No lymphadenopathy or thyromegaly appreciated. Cor: PMI nondisplaced. Regular rate & rhythm. No rubs, gallops or murmurs. Lungs: clear Abdomen: soft, nontender, nondistended. No hepatosplenomegaly. No bruits or masses. Good bowel sounds. Extremities: no cyanosis, clubbing, rash, trace bilateral LEE edema + unna boots Neuro: alert & oriented x 3, cranial nerves grossly intact. moves all 4 extremities w/o difficulty. Affect pleasant.   Telemetry:  Sinus tach low 100s Personally reviewed   Labs: Basic Metabolic Panel: Recent Labs  Lab 11/28/20 0532 11/29/20 0550 11/30/20 0500 12/01/20 0500 12/02/20 0351 12/03/20 0330 12/04/20 0354  NA 132*   < > 130* 132* 136 138 138  K 3.8   < > 3.8 3.5 3.8 3.3* 3.5   CL 97*   < > 94* 96* 99 100 101  CO2 27   < > '27 28 28 28 28  '$ GLUCOSE 130*   < > 162* 199* 131* 144* 129*  BUN 56*   < > 51* 45* 50* 45* 40*  CREATININE 2.85*   < > 2.35* 2.12* 2.08* 2.02* 1.99*  CALCIUM 8.6*   < > 8.4* 8.6* 8.8* 8.6* 8.9  MG 1.8  --   --   --   --   --   --    < > = values in this interval not displayed.    Liver Function Tests: No results for input(s): AST, ALT, ALKPHOS, BILITOT, PROT, ALBUMIN in the last 168 hours. No results for input(s): LIPASE, AMYLASE in the last 168 hours. No results for input(s): AMMONIA in the last 168 hours.  CBC: Recent Labs  Lab 11/28/20 0532 11/29/20 0550 11/30/20 0500 12/02/20 0351 12/03/20 0330  WBC 7.3 7.1 6.9 7.6 7.8  HGB 8.7* 8.4* 8.1* 8.3* 8.1*  HCT 27.4* 26.9* 25.9* 26.7* 26.2*  MCV 80.6 82.3 83.8 83.7 84.5  PLT 265 299 298 317 314    Cardiac Enzymes: No results for input(s): CKTOTAL, CKMB, CKMBINDEX, TROPONINI in the last 168 hours.  BNP: BNP (last 3 results) Recent Labs    11/11/20 2158  BNP 1,728.5*    ProBNP (last 3 results) Recent Labs    10/24/20 1119  PROBNP 1,685*  Other results:  Imaging: No results found.   Medications:     Scheduled Medications: . sodium chloride   Intravenous Once  . aspirin EC  81 mg Oral Daily  . atorvastatin  40 mg Oral Daily  . calcium carbonate  1 tablet Oral BID WC  . Chlorhexidine Gluconate Cloth  6 each Topical Daily  . enoxaparin (LOVENOX) injection  30 mg Subcutaneous Q24H  . ezetimibe  10 mg Oral Daily  . fluticasone  2 spray Each Nare BID  . furosemide  80 mg Intravenous BID  . insulin aspart  0-15 Units Subcutaneous TID AC & HS  . multivitamin with minerals  1 tablet Oral Daily  . omega-3 acid ethyl esters  1 g Oral Daily  . pantoprazole  40 mg Oral Daily  . polyethylene glycol  17 g Oral Daily  . sodium chloride flush  10-40 mL Intracatheter Q12H  . zolpidem  5 mg Oral Once    Infusions: . sodium chloride    . DOBUTamine 4 mcg/kg/min  (12/03/20 0622)    PRN Medications: sodium chloride, acetaminophen, calcium carbonate, guaiFENesin-dextromethorphan, ondansetron (ZOFRAN) IV, sodium chloride flush, sodium chloride flush   Assessment/Plan:   1. A/C Systolic Heart Failure,  ICM  - ECHO 2019 35-40%  - ECHO EF 20-25% RV normal Mod -Severe MR.  - RHC 11/16/20 - PCWP 25, RA 8, PA sat 46%   - BB and ARNI held due to hypotension.  - Intolerant SGT2i with yeast infections.  - No digoxin with AKI   - cMRI 11/20/20 EF 22% with mild-mod RV dysfunction. Diffuse LGE suggestive of severe ischemic CM. Little viable myocardium - In looking at MRI, Suspect this is end-stage ischemic CM with low output. Cath w/ severe MV CAD but given limited viability on cMRI not sure revascularization will give him durable benefit. Ideally, would get 2v PCI and MitraClip at some point but with dramatic CIN likely not an option. LVAD not option right now with AKI.  - Now on dobutamine to 4. Co-ox pending.  - Renal function has stabilized. Remains mildly fluid overloaded, give 80 mg IV Lasix x 1  - Will need home DBA. I spoke with Meryle Ready with [facility] [place]. Still workig to secure [facility] agency. Should be ready to go in am  - Continue unna boots.   2. Chronic Anemia, iron deficient  - EGD 2022 - normal. No recent colonoscopy - FOBT negative - Received feraheme this admit.  - SPEP negative -  EPO and LDH ok - 4/3 Given 1UPRBCs. 4/11 given 1u RBCs - Hgb 7.3 -> 8.7 -> 8.4 ->8.4 -> 8.1-> 8.3 -> 8.1. No obvious source of bleeding - On protonix.  - Ideally would get colonoscopy eventually but liekly too tenuous. CT C/A/P (no contrast on 4/11 no evidence of malignancy   3. CAD - Anterior MI 1997 PTCA LAD distal circ and 1st diagonal DES 2011 - LHC this admit w/ severe MVCAD  - There are targets for PCI in OM and RCA but risk of recurrent CIN very high. Will defer for now. May be better to let kidneys rest for several weeks and re-evaluating - No current angina -  continue ASA + statin + Zetia   4. AKI on CKD Stage IIIb - Creatinine baseline 1.6-1.8 , creatinine 1.66>>1.95>>3.23>3.9>4.05 -> 3.6 -> 3.3 -> 2.8 -> 2.5->2.35->2.12 -> 2.08 -> 2.02->1.99. Suspect contrast nephropathy.  Continue DBA for support  - Renal function seems to be settling out at new baseline. Have  decided to avoid repeat contrast exposure for at least a few weeks given severity of AKI with diagnostic cath only  - Appreciate Renal input.   5. Severe MR - this is functional.  LV not markedly dilated with LVEDD 5.8 cm, may be Mitraclip candidate but awaiting renal recovery.   Long discussion about next steps. Will try to get him home tomorrow with home doubtamine for a period of stabilization before reassess next step. High-risk VAD or Hospice looking like they may be only options. Home health arrangements to be finalized in am    Length of Stay: [address] PA-C  10:48 AM   Patient seen and examined with the above-signed Advanced Practice Provider and/or Housestaff. I personally reviewed laboratory data, imaging studies and relevant notes. I independently examined the patient and formulated the important aspects of the plan. I have edited the note to reflect any of my changes or salient points. I have personally discussed the plan with the patient and/or family.  Diuresing with IV lasix and metolazone. Remains on DBA 4. CVP 10-11 today. Co-ox 60. Creatinine down to 1.99  General:  Sitting in chair No resp difficulty HEENT: normal Neck: supple. JVP to jaw Carotids 2+ bilat; no bruits. No lymphadenopathy or thryomegaly appreciated. Cor: PMI nondisplaced. Regular tachy 2/6 MR Lungs: clear Abdomen: soft, nontender, nondistended. No hepatosplenomegaly. No bruits or masses. Good bowel sounds. Extremities: no cyanosis, clubbing, rash, tr edema Neuro: alert & orientedx3, cranial nerves grossly intact. moves all 4 extremities w/o difficulty. Affect pleasant  Remains inotrope  dependent. Continue DBA. Coox ok. Needs more diuresis today. Hopefully home soon with DBA. Will need torsemide 80 daily on d/c. Supp K.   Glori Bickers, MD  11:54 AM

## 2020-12-04 NOTE — Discharge Summary (Addendum)
Advanced Heart Failure Team  Discharge Summary   Patient ID: Marc [name] MRN: [id number], DOB/AGE: 20-May-1951 70 y.o. Admit date: 11/11/2020 D/C date:     12/05/2020   Primary Discharge Diagnoses:  Acute on Chronic Systolic Heart Failure/ Ischemic Cardiomyopathy Multivessel CAD Severe Mitral Regurgitation  AKI on CKD Stage IIIb (CIN) Chronic Anemia, Iron Deficient   Hospital Course:  70 y/o male with h/o CAD and ischemic CM baseline EF 35-40% admitted with worsening HF and persistent iron-deficient anemia.    Echo EF 20-25% with severe central MR. RV normal.   He received Feraheme and also hag EGD (unrevealing) but remained anemic   Developed AKI with attempts at diuresis, underwent subsequent RHC showing volume overload with low output. Diagnostic LHC showed severe MVCAD. He was placed on inotropic support and continued on IV diuretics. Surgical consult was placed for potential CABG. However cMRI showed limited viability, thus surgical revascularization was not felt to be of much benefit. VAD was felt to be only durable option but pt wished to consider attempt at PCI first. 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  He does have targets for PCI in OM and RCA, however risk of recurrent CIN felt to be very high. Decision was made to treat medically for now and to reassess in several weeks and consider PCI if renal function improves/ stabilizes. He may also be MitraClip candidate if renal recovery.   He was continued on DBA and remained inotrope dependent. He will continue w/ home DBA at 4 mcg/kg/min. We will d/c home w/ doubtamine for a period of stabilization before we reassess next step. High-risk VAD or Hospice looking like they may be only options.  He was last seen and examined by Dr. Haroldine Laws on 4/19 and felt stable for d/c home.  Volume status was stable at time of d/c. He was discharged home on torsemide 80 mg daily. SCr day of d/c was 2.2. d/c wt 157 lb. Post  hospital f/u arranged for 4/29.    See Detailed Hospital Problem List Below   1. A/C Systolic Heart Failure,  ICM  - ECHO 2019 35-40%  - ECHO EF 20-25% RV normal Mod -Severe MR.  - RHC 11/16/20 - PCWP 25, RA 8, PA sat 46%   - BB and ARNI held due to hypotension.  - Intolerant SGT2i with yeast infections.  - No digoxin with AKI   - cMRI 11/20/20 EF 22% with mild-mod RV dysfunction. Diffuse LGE suggestive of severe ischemic CM. Little viable myocardium - In looking at MRI, Suspect this is end-stage ischemic CM with low output. Cath w/ severe MV CAD but given limited viability on cMRI not sure revascularization will give him durable benefit. Ideally, would get 2v PCI and MitraClip at some point but with dramatic CIN likely not an option. LVAD not option right now with AKI.  - Remains inotrope dependent. On DBA 4. Co-ox 56%. Volume status better, CVP 8  - Plan d/c w/ home DBA  -Transition to torsemide 80 daily, starting tomorrow      2. Chronic Anemia, iron deficient  - EGD 2022 - normal. No recent colonoscopy - FOBT negative - Received feraheme this admit.  - SPEP negative -  EPO and LDH ok - 4/3 Given 1UPRBCs. 4/11 given 1u RBCs - Hgb 7.3 -> 8.7 -> 8.4 ->8.4 -> 8.1-> 8.3 -> 8.1->8.2. No obvious source of bleeding - On protonix.  - Ideally would get colonoscopy eventually but liekly too tenuous. CT C/A/P (no contrast  on 4/11 no evidence of malignancy    3. CAD - Anterior MI 1997 PTCA LAD distal circ and 1st diagonal DES 2011 - LHC this admit w/ severe MVCAD  - There are targets for PCI in OM and RCA but risk of recurrent CIN very high. Will defer for now. May be better to let kidneys rest for several weeks and re-evaluating - No current angina - continue ASA + statin + Zetia    4. AKI on CKD Stage IIIb - Creatinine baseline 1.6-1.8 , creatinine 1.66>>1.95>>3.23>3.9>4.05 -> 3.6 -> 3.3 -> 2.8 -> 2.5->2.35->2.12 -> 2.08 -> 2.02->1.99->2.2. Suspect contrast nephropathy.  Continue DBA for  support  - Renal function seems to be settling out at new baseline. Have decided to avoid repeat contrast exposure for at least a few weeks given severity of AKI with diagnostic cath only  - Appreciate Renal input. - Will obtain f/u BMP in 1 week    5. Severe MR - this is functional.  LV not markedly dilated with LVEDD 5.8 cm, may be Mitraclip candidate but awaiting renal recovery.    6. Hypomagnesemia  - Mg 1.7 - 2 g IV MgSO4 given       Cardiac Studies    RHC 11/06/20 Mild pulmonary hypertension with mean PA pressure 36 mmHg Mean pulmonary capillary wedge pressure 25 mmHg with V wave to 36 mmHg. Pulmonary artery O2 saturation 46% and 50%. Thermodilution cardiac output 3.85 L/min; cardiac index 2.0L/min/M2 Fick cardiac output 4.59 L/min and cardiac index 3.85 L/min per meter square PVR 2.4 Wood units using Fick and 2.86 using thermal dilution.   Overall, the patient is in left heart failure with poor cardiac output and will need inotropic support to clear pulmonary congestion and then hopefully reinitiate heart failure therapy    2D Echo 11/12/20   1. Global hypokinesis with akinesis of the apex; overall severe LV dysfunction. 2. Left ventricular ejection fraction, by estimation, is 20 to 25%. The left ventricle has severely decreased function. The left ventricle demonstrates regional wall motion abnormalities (see scoring diagram/findings for description). The left ventricular internal cavity size was mildly dilated. Left ventricular diastolic parameters are indeterminate. 3. Right ventricular systolic function is normal. The right ventricular size is normal. There is moderately elevated pulmonary artery systolic pressure. 4. Left atrial size was moderately dilated. 5. Moderate pleural effusion in the left lateral region. 6. The mitral valve is normal in structure. Moderate to severe mitral valve regurgitation. No evidence of mitral stenosis. 7. The aortic valve is  tricuspid. Aortic valve regurgitation is not visualized. No aortic stenosis is present. 8. The inferior vena cava is dilated in size with >50% respiratory variability, suggesting right atrial pressure of 8 mmHg.    LHC 11/21/20 Severe multivessel CAD Known chronic total occlusion of the mid LAD.  Left to left collaterals are noted.  Proximal to mid LAD 90% before septal perforators.  Large branching first diagonal with a patent stent in the mid to distal segment beyond the proximal bifurcation.  Eccentric 80% stenosis within a bend in the proximal vessel just after the origin from the LAD.  A smaller branch contains 95% stenosis after the proximal bifurcation. 30% ostial left main Diffuse disease in the proximal circumflex with up to 50% narrowing.  The dominant first obtuse marginal contains segmental 95% stenosis.  The mid first obtuse marginal contains a patent stent.  The continuation of the circumflex gives origin to 2 small obtuse marginal branches. Right coronary is dominant.  First  images were taken on a 10 inch magnification.  There are tandem 85% followed by 75% proximal to mid LAD stenoses.  Distally at the bifurcation with the PDA there is 75% stenosis.  The ostium of the PDA contains 85% stenosis.  The PDA has slight left to right filling by collaterals. LVEDP 25 mmHg on IV dobutamine.   RECOMMENDATIONS:   There are targets for PCI.  If PCI chosen this would be a high risk procedure and likely would require hemodynamic support.  Targets are the circumflex obtuse marginal, proximal diagonal #1, and proximal to mid RCA.  The distal RCA bifurcation would be more tricky. Significant mitral regurgitation.  Possible MitraClip target if appropriate. Need to definitively determine whether surgical revascularization is possible. Advanced therapies for stage IV CHF will be required to hopefully better stabilize the patient if PCI is to be contemplated.  Cardiac MRI 11/20/20  IMPRESSION: 1.  Mildly dilated LV with EF 22%, wall motion abnormalities as noted above.   2.  Normal RV size with mildly decreased systolic function, EF [id number].   3. Suspect severe mitral regurgitation (flow sequences to confirm were not done.   4. Delayed enhancement imaging shows extensive LGE in a coronary pattern. The peri-apical segments do not appear viable.    Discharge Weight Range: 157 lb  Discharge Vitals: Blood pressure 95/67, pulse (!) 106, temperature 98.4 F (36.9 C), temperature source Oral, resp. rate 20, height '5\' 7"'$  (1.702 m), weight 71.2 kg, SpO2 93 %.  Labs: Lab Results  Component Value Date   WBC 7.9 12/05/2020   HGB 8.2 (L) 12/05/2020   HCT 26.6 (L) 12/05/2020   MCV 83.4 12/05/2020   PLT 301 12/05/2020    Recent Labs  Lab 12/05/20 0505  NA 139  K 3.7  CL 101  CO2 30  BUN 45*  CREATININE 2.15*  CALCIUM 9.0  GLUCOSE 116*   Lab Results  Component Value Date   CHOL 71 (L) 11/12/2019   HDL 26 (L) 11/12/2019   LDLCALC 30 11/12/2019   TRIG 64 11/12/2019   BNP (last 3 results) Recent Labs    11/11/20 2158  BNP 1,728.5*    ProBNP (last 3 results) Recent Labs    10/24/20 1119  PROBNP 1,685*     Diagnostic Studies/Procedures   No results found.  Discharge Medications   Allergies as of 12/05/2020       Reactions   Candesartan Cilexetil Itching   Other reaction(s): itching, tingling in extrmeities   Spironolactone    Other reaction(s): GI Upset (intolerance), gynecomastia        Medication List     STOP taking these medications    carvedilol 25 MG tablet Commonly known as: COREG   clopidogrel 75 MG tablet Commonly known as: PLAVIX   Entresto 49-51 MG Generic drug: sacubitril-valsartan   fish oil-omega-3 fatty acids 1000 MG capsule Replaced by: omega-3 acid ethyl esters 1 g capsule   furosemide 20 MG tablet Commonly known as: LASIX   metFORMIN 500 MG tablet Commonly known as: GLUCOPHAGE       TAKE these medications     acetaminophen 500 MG tablet Commonly known as: TYLENOL Take 1,000 mg by mouth every 6 (six) hours as needed for mild pain.   aspirin 81 MG EC tablet Take 1 tablet (81 mg total) by mouth daily. Swallow whole. Start taking on: December 06, 2020   atorvastatin 40 MG tablet Commonly known as: LIPITOR TAKE 1 TABLET BY MOUTH EVERY DAY  betamethasone dipropionate 0.05 % cream Apply 1 application topically daily as needed (rash).   calcium carbonate 500 MG chewable tablet Commonly known as: TUMS - dosed in mg elemental calcium Chew 1 tablet by mouth as needed for heartburn.   cetirizine-pseudoephedrine 5-120 MG tablet Commonly known as: ZYRTEC-D Take 1 tablet by mouth daily.   diclofenac sodium 1 % Gel Commonly known as: VOLTAREN Apply 2 g topically 4 (four) times daily as needed (knee pain).   DOBUTamine 4-5 MG/ML-% infusion Commonly known as: DOBUTREX Inject 284.8 mcg/min into the vein continuous.   ezetimibe 10 MG tablet Commonly known as: ZETIA TAKE 1 TABLET BY MOUTH EVERY DAY   glimepiride 4 MG tablet Commonly known as: AMARYL Take 4 mg by mouth daily.   iron polysaccharides 150 MG capsule Commonly known as: NIFEREX Take 150 mg by mouth daily.   multivitamin capsule Take 1 capsule by mouth daily.   nitroGLYCERIN 0.4 MG SL tablet Commonly known as: NITROSTAT PLACE 1 TABLET UNDER THE TONGUE EVERY 5 MINUTES AS NEEDED FOR CHEST PAIN What changed: See the new instructions.   omega-3 acid ethyl esters 1 g capsule Commonly known as: LOVAZA Take 1 capsule (1 g total) by mouth daily. Start taking on: December 06, 2020 Replaces: fish oil-omega-3 fatty acids 1000 MG capsule   pantoprazole 40 MG tablet Commonly known as: PROTONIX Take 1 tablet (40 mg total) by mouth daily. What changed: when to take this   potassium chloride SA 20 MEQ tablet Commonly known as: KLOR-CON Take 2 tablets (40 mEq total) by mouth daily.   selenium sulfide 2.5 % shampoo Commonly known as:  SELSUN Apply 1 application topically daily as needed for itching.   Torsemide 40 MG Tabs Take 80 mg by mouth daily. Start taking on: December 06, 2020   ZzzQuil 50 MG/30ML Liqd Generic drug: diphenhydrAMINE HCl Take 15-30 mLs by mouth at bedtime as needed (sleep).               Durable Medical Equipment  (From admission, onward)           Start     Ordered   11/30/20 1348  Heart failure home health orders  (Heart failure home health orders / Face to face)  Once       Comments: Heart Failure Follow-up Care:  Verify follow-up appointments per Patient Discharge Instructions. Confirm transportation arranged. Reconcile home medications with discharge medication list. Remove discontinued medications from use. Assist patient/caregiver to manage medications using pill box. Reinforce low sodium food selection Assessments: Vital signs and oxygen saturation at each visit. Assess home environment for safety concerns, caregiver support and availability of low-sodium foods. Consult Education officer, museum, PT/OT, Dietitian, and CNA based on assessments. Perform comprehensive cardiopulmonary assessment. Notify MD for any change in condition or weight gain of 3 pounds in one day or 5 pounds in one week with symptoms. Daily Weights and Symptom Monitoring: Ensure patient has access to scales. Teach patient/caregiver to weigh daily before breakfast and after voiding using same scale and record.    Teach patient/caregiver to track weight and symptoms and when to notify Provider. Activity: Develop individualized activity plan with patient/caregiver.   Home Paraenteral Inotropic Therapy : Data Collection Form  Patients name: MAINOR DEWIT   Date: 11/30/20  Information below may not be completed by the supplier nor anyone in a Financial relationship with the supplier.  1. Results of invasive hemodynamic monitoring  Cardiac Index Before Inotrope infusion:  1.5            On Inotrope  infusion:            2          Drug and dose:   Dobutamine 4 mcg.   2. Cardiac medications immediately prior to inotrope infusion (List name, dose, and frequency) IV lasix   3. Dose this represent maximum tolerated doses of these medications? Yes.   4. Breathing status Prior to inotrope infusion: Dyspnea at rest  At time of discharge: Dyspnea on moderate  exertion.   5. Initial home prescription Drug and Dose:   Dobutamine 4 mcg. for continuous infusion 24/hr day and 7 days/week  6. If continuous infusion is prescribed, have attempts to discontinue inotrope infusion in the hospital failed?   Yes.   7. If intermittent infusion is prescribed, have there been repeated hospitalizations for heart failure which Parenteral inotrope were required? Not applicable.   8. Is patient capable of going to the physician for outpatient evaluation? Yes.    9. Is routine electrocardiographic monitoring required in the Home?  No.   The above statements and any additional explanations included separately are true and accurate and there is documentation present in the patients medical record to support these statements.   Completed by Darrick Grinder, NP   In instances where this form was completed by an Advanced Practice Provider, please see EMR for physician Co-Signature.  [facility] to provide  Labs every other week to include BMET, Mg, and CBC with Diff. Additional as needed. Should be drawn via PERIPHERAL stick. NOT PICC line.   J1250 Dobutamine 4 mcg/kg/min X 52 weeks A4221 Supplies for maintenance of drug infusion catheter A4222 Supplies for the external drug infusion per cassette or bag E0781 Ambulatory Infusion pump  Question Answer Comment  Heart Failure Follow-up Care [facility] ([facility] at [phone]   Obtain the following labs Basic Metabolic Panel   Lab frequency Weekly   Fax lab results to AHF Clinic at [phone]   Diet Low Sodium Heart Healthy   Fluid restrictions: 1800  mL Fluid   Skilled Nurse to notify MD of weight trends weekly for first 2 weeks. May fax or call: AHF Clinic at [phone] (fax) or [phone]      11/30/20 1350            Disposition   The patient will be discharged in stable condition to home. Discharge Instructions     Amb Referral to HF Clinic   Complete by: As directed        Follow-up Information     Grabiela Wohlford, Shaune Pascal, MD Follow up on 12/15/2020.   Specialty: Cardiology Why: 11:20 AM  The [facility] at [facility], [place] [address] Contact information: [address] [place] [address] [phone]         Lawerance Cruel, MD. Go on 12/14/2020.   Specialty: Family Medicine Why: '@2'$ :15pm Contact information: [address] [place] [address] [phone]         Belva Crome, MD .   Specialty: Cardiology Contact information: [phone] [place] [address] [phone]                   Duration of Discharge Encounter: Greater than 35 minutes   Signed, Nelida Gores  12/05/2020, 4:21 PM  Patient seen and examined with the above-signed Advanced Practice Provider  and/or Housestaff. I personally reviewed laboratory data, imaging studies and relevant notes. I independently examined the patient and formulated the important aspects of the plan. I have edited the note to reflect any of my changes or salient points. I have personally discussed the plan with the patient and/or family.  He is inotrope dependent. Will send home on DBA. Follow closely in HF clinic to assess next steps.   Glori Bickers, MD  11:08 PM

## 2020-12-04 NOTE — Progress Notes (Signed)
Educated daughter at bedside regarding how to clean and flush the PICC line.  Answered all questions from patient and daughter.

## 2020-12-04 NOTE — TOC Progression Note (Addendum)
Transition of Care [facility]) - Progression Note    Patient Details  Name: Marc [name] MRN: [id number] Date of Birth: 02-Aug-1951  [facility]) CM/SW Contact  Zenon Mayo, RN Phone Number: 12/04/2020, 1:30 PM  Clinical Narrative:    NCM following patient, patient is for dc home with dobutomine.  NCM will follow for dc needs. Have not been able to find an agencies that are able to take referral for dobutamine.   NCM spoke with Amy with [facility] yesterday, she states she thought she could take patient,but then her director states she could not take the referral.  NCM reached out Levada Dy at [place], she will look into and let me know. Levada Dy at [place] is unable to supply staffing.  Patient has family friends who will be able to help him at home who are a MD and a NP, Patient states he feels comfortable doing the dobutamine picc care.  He will a weekly bag change, next one will be on Tuesday.  NCM will continue to check to see if an agency will have staffing that will come available next week.         Expected Discharge Plan and Services                                                 Social Determinants of Health (SDOH) Interventions Food Insecurity Interventions: Intervention Not Indicated Financial Strain Interventions: Intervention Not Indicated Housing Interventions: Intervention Not Indicated Transportation Interventions: Intervention Not Indicated Alcohol Brief Interventions/Follow-up: AUDIT Score <7 follow-up not indicated  Readmission Risk Interventions No flowsheet data found.

## 2020-12-05 ENCOUNTER — Other Ambulatory Visit: Payer: Self-pay | Admitting: Cardiology

## 2020-12-05 LAB — COOXEMETRY PANEL
Carboxyhemoglobin: 1.7 % — ABNORMAL HIGH (ref 0.5–1.5)
Methemoglobin: 0.9 % (ref 0.0–1.5)
O2 Saturation: 56.4 %
Total hemoglobin: 8.2 g/dL — ABNORMAL LOW (ref 12.0–16.0)

## 2020-12-05 LAB — GLUCOSE, CAPILLARY
Glucose-Capillary: 134 mg/dL — ABNORMAL HIGH (ref 70–99)
Glucose-Capillary: 241 mg/dL — ABNORMAL HIGH (ref 70–99)
Glucose-Capillary: 215 mg/dL — ABNORMAL HIGH (ref 70–99)

## 2020-12-05 LAB — BASIC METABOLIC PANEL
Anion gap: 8 (ref 5–15)
BUN: 45 mg/dL — ABNORMAL HIGH (ref 8–23)
CO2: 30 mmol/L (ref 22–32)
Calcium: 9 mg/dL (ref 8.9–10.3)
Chloride: 101 mmol/L (ref 98–111)
Creatinine, Ser: 2.15 mg/dL — ABNORMAL HIGH (ref 0.61–1.24)
GFR, Estimated: 33 mL/min — ABNORMAL LOW (ref >60)
Glucose, Bld: 116 mg/dL — ABNORMAL HIGH (ref 70–99)
Potassium: 3.7 mmol/L (ref 3.5–5.1)
Sodium: 139 mmol/L (ref 135–145)

## 2020-12-05 LAB — CBC
HCT: 26.6 % — ABNORMAL LOW (ref 39.0–52.0)
Hemoglobin: 8.2 g/dL — ABNORMAL LOW (ref 13.0–17.0)
MCH: 25.7 pg — ABNORMAL LOW (ref 26.0–34.0)
MCHC: 30.8 g/dL (ref 30.0–36.0)
MCV: 83.4 fL (ref 80.0–100.0)
Platelets: 301 10*3/uL (ref 150–400)
RBC: 3.19 MIL/uL — ABNORMAL LOW (ref 4.22–5.81)
RDW: 19.6 % — ABNORMAL HIGH (ref 11.5–15.5)
WBC: 7.9 10*3/uL (ref 4.0–10.5)
nRBC: 0 % (ref 0.0–0.2)

## 2020-12-05 LAB — MAGNESIUM: Magnesium: 1.7 mg/dL (ref 1.7–2.4)

## 2020-12-05 MED ORDER — POTASSIUM CHLORIDE CRYS ER 20 MEQ PO TBCR: 40.0000 meq | EXTENDED_RELEASE_TABLET | Freq: Every day | ORAL | 5 refills | Status: AC

## 2020-12-05 MED ORDER — ASPIRIN 81 MG PO TBEC: 81.0000 mg | DELAYED_RELEASE_TABLET | Freq: Every day | ORAL | 11 refills | Status: AC

## 2020-12-05 MED ORDER — TORSEMIDE 40 MG PO TABS: 80.0000 mg | ORAL_TABLET | Freq: Every day | ORAL | 5 refills | Status: AC

## 2020-12-05 MED ORDER — DOBUTAMINE IN D5W 4-5 MG/ML-% IV SOLN: 4.0000 ug/kg/min | INTRAVENOUS | Status: AC

## 2020-12-05 MED ORDER — POTASSIUM CHLORIDE CRYS ER 20 MEQ PO TBCR
40.0000 meq | EXTENDED_RELEASE_TABLET | Freq: Four times a day (QID) | ORAL | Status: DC
  Administered 2020-12-05: 40 meq via ORAL
  Filled 2020-12-05: qty 2

## 2020-12-05 MED ORDER — PANTOPRAZOLE SODIUM 40 MG PO TBEC: 40.0000 mg | DELAYED_RELEASE_TABLET | Freq: Every day | ORAL | Status: AC

## 2020-12-05 MED ORDER — OMEGA-3-ACID ETHYL ESTERS 1 G PO CAPS: 1.0000 g | ORAL_CAPSULE | Freq: Every day | ORAL | 5 refills | Status: AC

## 2020-12-05 MED ORDER — MAGNESIUM SULFATE 2 GM/50ML IV SOLN
2.0000 g | Freq: Once | INTRAVENOUS | Status: AC
  Administered 2020-12-05: 2 g via INTRAVENOUS
  Filled 2020-12-05: qty 50

## 2020-12-05 NOTE — Progress Notes (Signed)
   12/05/20 1014  Mobility  Activity Refused mobility (Attempt to see patient x2 this AM. MD in room at this time. Will check back as time permits)

## 2020-12-05 NOTE — Progress Notes (Signed)
D/C instructions given and reviewed. Tele removed. Wife at bedside to transport home.

## 2020-12-05 NOTE — Progress Notes (Signed)
   12/05/20 0449  Assess: MEWS Score  Temp 98.4 F (36.9 C)  BP 105/67  Pulse Rate (!) 106  ECG Heart Rate (!) 106  Resp (!) 21  SpO2 93 %  Assess: MEWS Score  MEWS Temp 0  MEWS Systolic 0  MEWS Pulse 1  MEWS RR 1  MEWS LOC 0  MEWS Score 2  MEWS Score Color Yellow  Assess: if the MEWS score is Yellow or Red  Were vital signs taken at a resting state? Yes  Focused Assessment No change from prior assessment  Early Detection of Sepsis Score *See Row Information* High  MEWS guidelines implemented *See Row Information* No, vital signs rechecked  Document  Patient Outcome Other (Comment) (no interventions necessary)  Progress note created (see row info) Yes

## 2020-12-05 NOTE — TOC Benefit Eligibility Note (Signed)
Transition of Care [facility]) Benefit Eligibility Note    Patient Details  Name: Marc [name] MRN: [id number] Date of Birth: March 25, 1951      Covered?: Yes ([place])        Spoke with Person/Company/Phone Number:: [employer] @ [facility]  #  [phone]     Prior Approval: Yes [phone])     Additional Notes: [place] 20% CO-INS and WILL NEED PRIOR APPROVAL YES # [phone]    Memory Argue Phone Number: 12/05/2020, 9:09 AM

## 2020-12-05 NOTE — Plan of Care (Signed)

## 2020-12-05 NOTE — TOC Transition Note (Addendum)
Transition of Care [facility]) - CM/SW Discharge Note   Patient Details  Name: Marc [name] MRN: [id number] Date of Birth: 1951/06/28  Transition [facility] Care [facility]) CM/SW Contact:  Zenon Mayo, RN Phone Number: 12/05/2020, 2:00 PM   Clinical Narrative:     patient is for dc home with dobutomine.  NCM will follow for dc needs. Have not been able to find an agencies that are able to take referral for dobutamine. ( [place],  [facility], [place], [facility], [facility], [facility], [facility], [facility], [facility], no staff to support need- [facility] does not have a contract with [facility].)  NCM spoke with Amy with [facility] yesterday, she states she thought she could take patient,but then her director states she could not take the referral due to staffing.  NCM reached out Fl[place] at [place], she will look into and let me know. Levada Dy at [place] is unable to supply staffing.  Patient has family friends who will be able to help him at home who are a MD and a NP, Patient states he feels comfortable doing the dobutamine picc care also Pam did teaching with his wife.  He will a weekly bag change, next one will be on Tuesday.   NCM informed supervisor of this Information systems manager.    4/19- Per Carolynn Sayers, she will meet with patient and wife weekly for a few weeks in the short stay clinic to tetach patient wife how to change the bag, Pam will make an apt with short stay to do the dressing change to  picc  weekly and draw labs every other week.  Patient will go home with supplies also. Chemical engineer informed of this information.    Final next level of care: Home/Self Care Barriers to Discharge: No [facility] will accept this patient   Patient Goals and CMS Choice Patient states their goals for this hospitalization and ongoing recovery are:: return home with family and friends help      Discharge Placement                       Discharge Plan and  Services                  DME Agency: NA                  Social Determinants of Health (SDOH) Interventions Food Insecurity Interventions: Intervention Not Indicated Financial Strain Interventions: Intervention Not Indicated Housing Interventions: Intervention Not Indicated Transportation Interventions: Intervention Not Indicated Alcohol Brief Interventions/Follow-up: AUDIT Score <7 follow-up not indicated   Readmission Risk Interventions No flowsheet data found.

## 2020-12-05 NOTE — Progress Notes (Addendum)
Patient ID: Marc [name], male   DOB: 07-19-1951, 70 y.o.   MRN: [id number]    Advanced Heart Failure Rounding Note   Subjective:    -LHC 4/5 showed multivessel CAD. There are targets for PCI, though cMRI showed EF 22% limited viability. -CT C/A/P 11/27/20. No evidence of malignancy  Remains inotrope dependent. On DBA 4. Co-ox 56%  Wt continued to trend down, down 2 lb from yesterday. CVP 8  - 3L in UOP yesterday.   Scr back up today 2.5>>2.4>>2.1 -> 2.08 -> 2.02-->1.99 -->2.2    Objective:   Weight Range:  Vital Signs:   Temp:  [98 F (36.7 C)-99.2 F (37.3 C)] 98.4 F (36.9 C) (04/19 0449) Pulse Rate:  [101-106] 106 (04/19 0449) Resp:  [17-21] 20 (04/19 0505) BP: (91-106)/(65-72) 95/67 (04/19 0800) SpO2:  [93 %-100 %] 93 % (04/19 0449) Weight:  [71.2 kg] 71.2 kg (04/19 0449) Last BM Date: 12/04/20  Weight change: Filed Weights   12/02/20 0112 12/03/20 0345 12/05/20 0449  Weight: 73.3 kg 72.5 kg 71.2 kg    Intake/Output:   Intake/Output Summary (Last 24 hours) at 12/05/2020 1054 Last data filed at 12/05/2020 0752 Gross per 24 hour  Intake 1325.05 ml  Output 2426 ml  Net -1100.95 ml   PHYSICAL EXAM: CVP 8  General:  Well appearing. No respiratory difficulty HEENT: normal Neck: supple. JVD 8-9 cm. Carotids 2+ bilat; no bruits. No lymphadenopathy or thyromegaly appreciated. Cor: PMI nondisplaced. Regular rate & rhythm. No rubs, gallops or murmurs. Lungs: clear Abdomen: soft, nontender, nondistended. No hepatosplenomegaly. No bruits or masses. Good bowel sounds. Extremities: no cyanosis, clubbing, rash, no edema + unna boots Neuro: alert & oriented x 3, cranial nerves grossly intact. moves all 4 extremities w/o difficulty. Affect pleasant.   Telemetry:  NSR 90s  Personally reviewed   Labs: Basic Metabolic Panel: Recent Labs  Lab 12/01/20 0500 12/02/20 0351 12/03/20 0330 12/04/20 0354 12/05/20 0505  NA 132* 136 138 138 139  K 3.5 3.8 3.3* 3.5  3.7  CL 96* 99 100 101 101  CO2 '28 28 28 28 30  '$ GLUCOSE 199* 131* 144* 129* 116*  BUN 45* 50* 45* 40* 45*  CREATININE 2.12* 2.08* 2.02* 1.99* 2.15*  CALCIUM 8.6* 8.8* 8.6* 8.9 9.0  MG  --   --   --   --  1.7    Liver Function Tests: No results for input(s): AST, ALT, ALKPHOS, BILITOT, PROT, ALBUMIN in the last 168 hours. No results for input(s): LIPASE, AMYLASE in the last 168 hours. No results for input(s): AMMONIA in the last 168 hours.  CBC: Recent Labs  Lab 11/29/20 0550 11/30/20 0500 12/02/20 0351 12/03/20 0330 12/05/20 0505  WBC 7.1 6.9 7.6 7.8 7.9  HGB 8.4* 8.1* 8.3* 8.1* 8.2*  HCT 26.9* 25.9* 26.7* 26.2* 26.6*  MCV 82.3 83.8 83.7 84.5 83.4  PLT 299 298 317 314 301    Cardiac Enzymes: No results for input(s): CKTOTAL, CKMB, CKMBINDEX, TROPONINI in the last 168 hours.  BNP: BNP (last 3 results) Recent Labs    11/11/20 2158  BNP 1,728.5*    ProBNP (last 3 results) Recent Labs    10/24/20 1119  PROBNP 1,685*      Other results:  Imaging: No results found.   Medications:     Scheduled Medications: . sodium chloride   Intravenous Once  . aspirin EC  81 mg Oral Daily  . atorvastatin  40 mg Oral Daily  . calcium carbonate  1  tablet Oral BID WC  . Chlorhexidine Gluconate Cloth  6 each Topical Daily  . enoxaparin (LOVENOX) injection  30 mg Subcutaneous Q24H  . ezetimibe  10 mg Oral Daily  . fluticasone  2 spray Each Nare BID  . furosemide  80 mg Intravenous BID  . insulin aspart  0-15 Units Subcutaneous TID AC & HS  . multivitamin with minerals  1 tablet Oral Daily  . omega-3 acid ethyl esters  1 g Oral Daily  . pantoprazole  40 mg Oral Daily  . polyethylene glycol  17 g Oral Daily  . potassium chloride  40 mEq Oral Q6H  . sodium chloride flush  10-40 mL Intracatheter Q12H    Infusions: . sodium chloride    . DOBUTamine 4 mcg/kg/min (12/04/20 1900)  . magnesium sulfate bolus IVPB      PRN Medications: sodium chloride, acetaminophen,  calcium carbonate, guaiFENesin-dextromethorphan, ondansetron (ZOFRAN) IV, sodium chloride flush, sodium chloride flush   Assessment/Plan:   1. A/C Systolic Heart Failure,  ICM  - ECHO 2019 35-40%  - ECHO EF 20-25% RV normal Mod -Severe MR.  - RHC 11/16/20 - PCWP 25, RA 8, PA sat 46%   - BB and ARNI held due to hypotension.  - Intolerant SGT2i with yeast infections.  - No digoxin with AKI   - cMRI 11/20/20 EF 22% with mild-mod RV dysfunction. Diffuse LGE suggestive of severe ischemic CM. Little viable myocardium - In looking at MRI, Suspect this is end-stage ischemic CM with low output. Cath w/ severe MV CAD but given limited viability on cMRI not sure revascularization will give him durable benefit. Ideally, would get 2v PCI and MitraClip at some point but with dramatic CIN likely not an option. LVAD not option right now with AKI.  - Remains inotrope dependent. On DBA 4. Co-ox 56%. Volume status better, CVP 8  - Plan d/c w/ home DBA  -Transition to torsemide 80 daily, starting tomorrow    2. Chronic Anemia, iron deficient  - EGD 2022 - normal. No recent colonoscopy - FOBT negative - Received feraheme this admit.  - SPEP negative -  EPO and LDH ok - 4/3 Given 1UPRBCs. 4/11 given 1u RBCs - Hgb 7.3 -> 8.7 -> 8.4 ->8.4 -> 8.1-> 8.3 -> 8.1->8.2. No obvious source of bleeding - On protonix.  - Ideally would get colonoscopy eventually but liekly too tenuous. CT C/A/P (no contrast on 4/11 no evidence of malignancy   3. CAD - Anterior MI 1997 PTCA LAD distal circ and 1st diagonal DES 2011 - LHC this admit w/ severe MVCAD  - There are targets for PCI in OM and RCA but risk of recurrent CIN very high. Will defer for now. May be better to let kidneys rest for several weeks and re-evaluating - No current angina - continue ASA + statin + Zetia   4. AKI on CKD Stage IIIb - Creatinine baseline 1.6-1.8 , creatinine 1.66>>1.95>>3.23>3.9>4.05 -> 3.6 -> 3.3 -> 2.8 -> 2.5->2.35->2.12 -> 2.08 ->  2.02->1.99->2.2. Suspect contrast nephropathy.  Continue DBA for support  - Renal function seems to be settling out at new baseline. Have decided to avoid repeat contrast exposure for at least a few weeks given severity of AKI with diagnostic cath only  - Appreciate Renal input. - Will obtain f/u BMP in 1 week    5. Severe MR - this is functional.  LV not markedly dilated with LVEDD 5.8 cm, may be Mitraclip candidate but awaiting renal recovery.   6.  Hypomagnesemia  - Mg 1.7 - 2 g IV MgSO4 given   Long discussion about next steps. We will d/c home w/ doubtamine for a period of stabilization before we reassess next step. High-risk VAD or Hospice looking like they may be only options.   Plan d/c home today. Will arrange close clinic f/u in 1 week. Repeat BMP in 1 week.    Length of Stay: [place], PA-C  10:54 AM    Patient seen and examined with the above-signed Advanced Practice Provider and/or Housestaff. I personally reviewed laboratory data, imaging studies and relevant notes. I independently examined the patient and formulated the important aspects of the plan. I have edited the note to reflect any of my changes or salient points. I have personally discussed the plan with the patient and/or family.  Feels ok. Eager to go home. Scr up slightly  General:  Well appearing. No resp difficulty HEENT: normal Neck: supple.JVP 8  Carotids 2+ bilat; no bruits. No lymphadenopathy or thryomegaly appreciated. Cor: PMI nondisplaced. Regular rate & rhythm. 2/6 MR Lungs: clear Abdomen: soft, nontender, nondistended. No hepatosplenomegaly. No bruits or masses. Good bowel sounds. Extremities: no cyanosis, clubbing, rash, edema Neuro: alert & orientedx3, cranial nerves grossly intact. moves all 4 extremities w/o difficulty. Affect pleasant  Agree with above. I think this is as good as we can get him. Plan d/c home with DBA today. Unfortunately we do not have a [employer] agency to help him  but he has enough medical support at home to help provide care. We will see him back in the office soon.    Glori Bickers, MD  1:06 PM   .

## 2020-12-07 NOTE — Telephone Encounter (Signed)
Pt pharmacy is stating, Pharmacy comment: Script Clarification:TORSEMIDE DOES NOT COME IN '40MG'$  TABS. ONLY 10, 20, AND '100MG'$ . Please address

## 2020-12-10 ENCOUNTER — Emergency Department (HOSPITAL_COMMUNITY): Payer: HMO

## 2020-12-10 ENCOUNTER — Inpatient Hospital Stay (HOSPITAL_COMMUNITY)
Admission: EM | Admit: 2020-12-10 | Discharge: 2020-12-17 | DRG: 291 | Disposition: E | Payer: HMO | Attending: Cardiology | Admitting: Cardiology

## 2020-12-10 DIAGNOSIS — I420 Dilated cardiomyopathy: Secondary | ICD-10-CM | POA: Diagnosis not present

## 2020-12-10 DIAGNOSIS — I255 Ischemic cardiomyopathy: Secondary | ICD-10-CM | POA: Diagnosis present

## 2020-12-10 DIAGNOSIS — E785 Hyperlipidemia, unspecified: Secondary | ICD-10-CM | POA: Diagnosis present

## 2020-12-10 DIAGNOSIS — D649 Anemia, unspecified: Secondary | ICD-10-CM | POA: Diagnosis present

## 2020-12-10 DIAGNOSIS — E875 Hyperkalemia: Secondary | ICD-10-CM | POA: Diagnosis not present

## 2020-12-10 DIAGNOSIS — Z955 Presence of coronary angioplasty implant and graft: Secondary | ICD-10-CM | POA: Diagnosis not present

## 2020-12-10 DIAGNOSIS — I509 Heart failure, unspecified: Secondary | ICD-10-CM

## 2020-12-10 DIAGNOSIS — I5084 End stage heart failure: Secondary | ICD-10-CM | POA: Diagnosis present

## 2020-12-10 DIAGNOSIS — I340 Nonrheumatic mitral (valve) insufficiency: Secondary | ICD-10-CM | POA: Diagnosis present

## 2020-12-10 DIAGNOSIS — I4901 Ventricular fibrillation: Secondary | ICD-10-CM | POA: Diagnosis not present

## 2020-12-10 DIAGNOSIS — E7800 Pure hypercholesterolemia, unspecified: Secondary | ICD-10-CM | POA: Diagnosis present

## 2020-12-10 DIAGNOSIS — Z8349 Family history of other endocrine, nutritional and metabolic diseases: Secondary | ICD-10-CM

## 2020-12-10 DIAGNOSIS — Z515 Encounter for palliative care: Secondary | ICD-10-CM

## 2020-12-10 DIAGNOSIS — Z66 Do not resuscitate: Secondary | ICD-10-CM | POA: Diagnosis not present

## 2020-12-10 DIAGNOSIS — Z8042 Family history of malignant neoplasm of prostate: Secondary | ICD-10-CM

## 2020-12-10 DIAGNOSIS — J9601 Acute respiratory failure with hypoxia: Secondary | ICD-10-CM | POA: Diagnosis not present

## 2020-12-10 DIAGNOSIS — Z833 Family history of diabetes mellitus: Secondary | ICD-10-CM

## 2020-12-10 DIAGNOSIS — Z8249 Family history of ischemic heart disease and other diseases of the circulatory system: Secondary | ICD-10-CM

## 2020-12-10 DIAGNOSIS — Z79899 Other long term (current) drug therapy: Secondary | ICD-10-CM | POA: Diagnosis not present

## 2020-12-10 DIAGNOSIS — I469 Cardiac arrest, cause unspecified: Secondary | ICD-10-CM | POA: Diagnosis not present

## 2020-12-10 DIAGNOSIS — I5043 Acute on chronic combined systolic (congestive) and diastolic (congestive) heart failure: Secondary | ICD-10-CM | POA: Diagnosis present

## 2020-12-10 DIAGNOSIS — I2510 Atherosclerotic heart disease of native coronary artery without angina pectoris: Secondary | ICD-10-CM

## 2020-12-10 DIAGNOSIS — I959 Hypotension, unspecified: Secondary | ICD-10-CM

## 2020-12-10 DIAGNOSIS — I252 Old myocardial infarction: Secondary | ICD-10-CM

## 2020-12-10 DIAGNOSIS — Z20822 Contact with and (suspected) exposure to covid-19: Secondary | ICD-10-CM | POA: Diagnosis present

## 2020-12-10 DIAGNOSIS — R7401 Elevation of levels of liver transaminase levels: Secondary | ICD-10-CM | POA: Diagnosis not present

## 2020-12-10 DIAGNOSIS — N184 Chronic kidney disease, stage 4 (severe): Secondary | ICD-10-CM | POA: Diagnosis present

## 2020-12-10 DIAGNOSIS — I248 Other forms of acute ischemic heart disease: Secondary | ICD-10-CM | POA: Diagnosis present

## 2020-12-10 DIAGNOSIS — Z823 Family history of stroke: Secondary | ICD-10-CM

## 2020-12-10 DIAGNOSIS — E1122 Type 2 diabetes mellitus with diabetic chronic kidney disease: Secondary | ICD-10-CM | POA: Diagnosis present

## 2020-12-10 DIAGNOSIS — Z7982 Long term (current) use of aspirin: Secondary | ICD-10-CM

## 2020-12-10 DIAGNOSIS — R570 Cardiogenic shock: Secondary | ICD-10-CM | POA: Diagnosis present

## 2020-12-10 DIAGNOSIS — I472 Ventricular tachycardia: Secondary | ICD-10-CM | POA: Diagnosis not present

## 2020-12-10 DIAGNOSIS — I130 Hypertensive heart and chronic kidney disease with heart failure and stage 1 through stage 4 chronic kidney disease, or unspecified chronic kidney disease: Secondary | ICD-10-CM | POA: Diagnosis present

## 2020-12-10 DIAGNOSIS — I462 Cardiac arrest due to underlying cardiac condition: Secondary | ICD-10-CM | POA: Diagnosis not present

## 2020-12-10 DIAGNOSIS — I2583 Coronary atherosclerosis due to lipid rich plaque: Secondary | ICD-10-CM | POA: Diagnosis not present

## 2020-12-10 DIAGNOSIS — R0902 Hypoxemia: Secondary | ICD-10-CM

## 2020-12-10 DIAGNOSIS — R7989 Other specified abnormal findings of blood chemistry: Secondary | ICD-10-CM | POA: Diagnosis present

## 2020-12-10 DIAGNOSIS — N179 Acute kidney failure, unspecified: Secondary | ICD-10-CM | POA: Diagnosis present

## 2020-12-10 LAB — COMPREHENSIVE METABOLIC PANEL
ALT: 94 U/L — ABNORMAL HIGH (ref 0–44)
AST: 89 U/L — ABNORMAL HIGH (ref 15–41)
Albumin: 3.2 g/dL — ABNORMAL LOW (ref 3.5–5.0)
Alkaline Phosphatase: 71 U/L (ref 38–126)
Anion gap: 16 — ABNORMAL HIGH (ref 5–15)
BUN: 50 mg/dL — ABNORMAL HIGH (ref 8–23)
CO2: 22 mmol/L (ref 22–32)
Calcium: 9.2 mg/dL (ref 8.9–10.3)
Chloride: 95 mmol/L — ABNORMAL LOW (ref 98–111)
Creatinine, Ser: 3.02 mg/dL — ABNORMAL HIGH (ref 0.61–1.24)
GFR, Estimated: 22 mL/min — ABNORMAL LOW (ref >60)
Glucose, Bld: 274 mg/dL — ABNORMAL HIGH (ref 70–99)
Potassium: 4.9 mmol/L (ref 3.5–5.1)
Sodium: 133 mmol/L — ABNORMAL LOW (ref 135–145)
Total Bilirubin: 2.1 mg/dL — ABNORMAL HIGH (ref 0.3–1.2)
Total Protein: 7.4 g/dL (ref 6.5–8.1)

## 2020-12-10 LAB — CBC WITH DIFFERENTIAL/PLATELET
Abs Immature Granulocytes: 0.04 10*3/uL (ref 0.00–0.07)
Basophils Absolute: 0 10*3/uL (ref 0.0–0.1)
Basophils Relative: 0 %
Eosinophils Absolute: 0.1 10*3/uL (ref 0.0–0.5)
Eosinophils Relative: 1 %
HCT: 30.6 % — ABNORMAL LOW (ref 39.0–52.0)
Hemoglobin: 8.9 g/dL — ABNORMAL LOW (ref 13.0–17.0)
Immature Granulocytes: 0 %
Lymphocytes Relative: 5 %
Lymphs Abs: 0.5 10*3/uL — ABNORMAL LOW (ref 0.7–4.0)
MCH: 25.1 pg — ABNORMAL LOW (ref 26.0–34.0)
MCHC: 29.1 g/dL — ABNORMAL LOW (ref 30.0–36.0)
MCV: 86.4 fL (ref 80.0–100.0)
Monocytes Absolute: 0.4 10*3/uL (ref 0.1–1.0)
Monocytes Relative: 4 %
Neutro Abs: 8.8 10*3/uL — ABNORMAL HIGH (ref 1.7–7.7)
Neutrophils Relative %: 90 %
Platelets: 267 10*3/uL (ref 150–400)
RBC: 3.54 MIL/uL — ABNORMAL LOW (ref 4.22–5.81)
RDW: 19.6 % — ABNORMAL HIGH (ref 11.5–15.5)
WBC: 9.8 10*3/uL (ref 4.0–10.5)
nRBC: 0 % (ref 0.0–0.2)

## 2020-12-10 LAB — BRAIN NATRIURETIC PEPTIDE: B Natriuretic Peptide: 1918.6 pg/mL — ABNORMAL HIGH (ref 0.0–100.0)

## 2020-12-10 LAB — COOXEMETRY PANEL
Carboxyhemoglobin: 1.2 % (ref 0.5–1.5)
Methemoglobin: 0.7 % (ref 0.0–1.5)
O2 Saturation: 87.9 %
Total hemoglobin: 9.5 g/dL — ABNORMAL LOW (ref 12.0–16.0)

## 2020-12-10 LAB — RESP PANEL BY RT-PCR (FLU A&B, COVID) ARPGX2
Influenza A by PCR: NEGATIVE
Influenza B by PCR: NEGATIVE
SARS Coronavirus 2 by RT PCR: NEGATIVE

## 2020-12-10 LAB — LACTIC ACID, PLASMA
Lactic Acid, Venous: 8.7 mmol/L (ref 0.5–1.9)
Lactic Acid, Venous: 9.5 mmol/L (ref 0.5–1.9)

## 2020-12-10 LAB — TROPONIN I (HIGH SENSITIVITY)
Troponin I (High Sensitivity): 94 ng/L — ABNORMAL HIGH (ref <18)
Troponin I (High Sensitivity): 1885 ng/L (ref <18)

## 2020-12-10 LAB — GLUCOSE, CAPILLARY: Glucose-Capillary: 160 mg/dL — ABNORMAL HIGH (ref 70–99)

## 2020-12-10 LAB — MRSA PCR SCREENING: MRSA by PCR: NEGATIVE

## 2020-12-10 MED ORDER — SODIUM CHLORIDE 0.9% FLUSH: 10.0000 mL | INTRAVENOUS | Status: DC | PRN

## 2020-12-10 MED ORDER — NOREPINEPHRINE 4 MG/250ML-% IV SOLN
0.0000 ug/min | INTRAVENOUS | Status: DC
Start: 2020-12-10 — End: 2020-12-11
  Administered 2020-12-10: 10 ug/min via INTRAVENOUS
  Administered 2020-12-11: 20 ug/min via INTRAVENOUS
  Administered 2020-12-11: 12 ug/min via INTRAVENOUS
  Filled 2020-12-10 (×3): qty 250

## 2020-12-10 MED ORDER — DOBUTAMINE IN D5W 4-5 MG/ML-% IV SOLN
6.0000 ug/kg/min | INTRAVENOUS | Status: DC
  Administered 2020-12-10: 4 ug/kg/min via INTRAVENOUS
  Filled 2020-12-10: qty 250

## 2020-12-10 MED ORDER — EZETIMIBE 10 MG PO TABS: 10.0000 mg | ORAL_TABLET | Freq: Every day | ORAL | Status: DC

## 2020-12-10 MED ORDER — CHLORHEXIDINE GLUCONATE CLOTH 2 % EX PADS: 6.0000 | MEDICATED_PAD | Freq: Every day | CUTANEOUS | Status: DC

## 2020-12-10 MED ORDER — BUMETANIDE 0.25 MG/ML IJ SOLN
2.0000 mg | Freq: Once | INTRAMUSCULAR | Status: DC
  Filled 2020-12-10: qty 8

## 2020-12-10 MED ORDER — FUROSEMIDE 10 MG/ML IJ SOLN
20.0000 mg/h | INTRAVENOUS | Status: DC
  Administered 2020-12-10 – 2020-12-11 (×2): 20 mg/h via INTRAVENOUS
  Filled 2020-12-10 (×3): qty 20

## 2020-12-10 MED ORDER — SODIUM CHLORIDE 0.9 % IV BOLUS
500.0000 mL | Freq: Once | INTRAVENOUS | Status: AC
  Administered 2020-12-10: 500 mL via INTRAVENOUS

## 2020-12-10 MED ORDER — ASPIRIN EC 81 MG PO TBEC: 81.0000 mg | DELAYED_RELEASE_TABLET | Freq: Every day | ORAL | Status: DC

## 2020-12-10 MED ORDER — OMEGA-3-ACID ETHYL ESTERS 1 G PO CAPS: 1.0000 g | ORAL_CAPSULE | Freq: Every day | ORAL | Status: DC

## 2020-12-10 MED ORDER — ALTEPLASE 2 MG IJ SOLR
2.0000 mg | Freq: Once | INTRAMUSCULAR | Status: AC
Start: 2020-12-10 — End: 2020-12-10
  Administered 2020-12-10: 2 mg
  Filled 2020-12-10: qty 2

## 2020-12-10 MED ORDER — SODIUM CHLORIDE 0.9% FLUSH: 10.0000 mL | Freq: Two times a day (BID) | INTRAVENOUS | Status: DC

## 2020-12-10 MED ORDER — FUROSEMIDE 10 MG/ML IJ SOLN
100.0000 mg | Freq: Once | INTRAVENOUS | Status: AC
  Administered 2020-12-10: 100 mg via INTRAVENOUS
  Filled 2020-12-10: qty 10

## 2020-12-10 MED ORDER — INSULIN ASPART 100 UNIT/ML ~~LOC~~ SOLN: 0.0000 [IU] | Freq: Three times a day (TID) | SUBCUTANEOUS | Status: DC

## 2020-12-10 MED ORDER — ACETAMINOPHEN 325 MG PO TABS: 650.0000 mg | ORAL_TABLET | ORAL | Status: DC | PRN

## 2020-12-10 MED ORDER — ATORVASTATIN CALCIUM 40 MG PO TABS: 40.0000 mg | ORAL_TABLET | Freq: Every day | ORAL | Status: DC

## 2020-12-10 MED ORDER — POLYSACCHARIDE IRON COMPLEX 150 MG PO CAPS
150.0000 mg | ORAL_CAPSULE | Freq: Every day | ORAL | Status: DC
  Filled 2020-12-10: qty 1

## 2020-12-10 MED ORDER — NITROGLYCERIN 0.4 MG SL SUBL: 0.4000 mg | SUBLINGUAL_TABLET | SUBLINGUAL | Status: DC | PRN

## 2020-12-10 MED ORDER — ONDANSETRON HCL 4 MG/2ML IJ SOLN
4.0000 mg | Freq: Four times a day (QID) | INTRAMUSCULAR | Status: DC | PRN
  Administered 2020-12-10: 4 mg via INTRAVENOUS
  Filled 2020-12-10: qty 2

## 2020-12-10 MED ORDER — ENOXAPARIN SODIUM 30 MG/0.3ML ~~LOC~~ SOLN
30.0000 mg | SUBCUTANEOUS | Status: DC
  Administered 2020-12-10: 30 mg via SUBCUTANEOUS
  Filled 2020-12-10: qty 0.3

## 2020-12-10 MED ORDER — PANTOPRAZOLE SODIUM 40 MG PO TBEC
40.0000 mg | DELAYED_RELEASE_TABLET | Freq: Every day | ORAL | Status: DC
  Administered 2020-12-10: 40 mg via ORAL
  Filled 2020-12-10: qty 1

## 2020-12-10 MED ORDER — DOBUTAMINE IN D5W 4-5 MG/ML-% IV SOLN: 4.0000 ug/kg/min | INTRAVENOUS | Status: DC

## 2020-12-10 NOTE — H&P (Addendum)
Cardiology Admission History and Physical:   Patient ID: SHAMARR FAUCETT MRN: [id number]; DOB: [birth year]   Admission date: 12/08/2020  PCP:  Lawerance Cruel, [place]  Cardiologist:  Sinclair Grooms, MD  Electrophysiologist:  None  Advanced Heart Failure Clinic:  Glori Bickers, MD    {  Chief Complaint:  Fatigue, shortness of breath, and hypotension  Patient Profile:   Marc [name] is a 70 y.o. male with a history of severe multivessel CAD with large anterior MI in 1997 s/p PTCA to LAD with subsequent DES to distal LCX and 1st Diag in 2011, ischemic cardiomyopathy/ chronic combined CHF with EF of 20-25% on recent Echo in 10/2020, severe mitral regurgitation, hyperlipidemia, diabetes, CKD stage III, and chronic anemia who presents with fatigue, shortness of breath, and hypotension.  History of Present Illness:   Mr. Hillmer is a 70 year old male with the above history who is followed by Dr. Tamala Julian and Dr. Haroldine Laws. He has a long history of CAD and ischemic cardiomyopathy. He had a large anterior MI in 1997 and underwent PTCA to LAD. Subsequently underwent DES to distal LCX and 1st Diag in 2011. Patient was recently admitted from 11/11/2020 to 12/05/2020 for acute on chronic CHF. Echo showed LVEF of 20-25% with global hypokinesis but akinesis of the apex and moderate to severe MR. RV was normal with moderately elevated PASP. He was started on IV Lasix with sluggish response and worsening renal function (creatinine peaked at 4.05) so Advanced Heart Failure Team was consulted. Dr. Haroldine Laws performed [place] on 11/16/2020 which showed low cardiac output and patient was started on inotropes to assist with diuresis. LHC on 11/21/2020 showed severe multivessel disease. CT surgery was consulted for possible CABG. However, cardiac MRI showed limited limited viability. Therefore, surgical revascularization was not felt to be an option. He was felt to have PCI  targets; however, there was concern for recurrent contrast-induced nephropathy so he was treated medically. He was ultimately discharged on home Dobutamine and Torsemide 2m daily. Not felt to be a LVAD candidate at that time due to AKI. Hospitalization also complicated by anemia. Hemoglobin 7.3 on admission and he received 1 unit of PRBCs as well as Feraheme.  Patient returns to the ED today with fatigue, shortness of breath, and hypotension. Patient states he initially did OK after discharge until today. He states he woke up and just felt like he had indigestion (discomfort in stomach not chest) and was having trouble clearing the mucus from his throat. He felt very fatigued and then became acutely short of breath. He denies any chest pain. He has stable orthopnea (sleeping on 2-3 pillows) but states he did not sleep well last night. He still has lower extremity edema but states it actually looks better than before. He notes intermittent nausea but no vomiting. No fevers at home. Cough improved. Stools are dark due to the iron supplement he is taking but no gross blood in urine or stools. No palpitations, lightheadedness, dizziness, near syncope.  Given significant fatigue and acute shortness of breath, he called 911. Systolic BP as low as 503/54with EMS. EMS gave fluids. Triage note mentions patient had decreased LOC but patient again patient denies any lightheadedness, dizziness, near syncope/syncope today.   Upon arrival to the ED, patient hypotensive with BP of 75/62 and mildly tachycardic and tachypneic. EKG showed sinus tachycardia, rate 107, with no acute ST/T changes. High-sensitivity troponin and BNP pending. WBC 9.8, Hgb  8.9, Plts 267. Na 133, K 4.9, Glucose 274, BUN 50, Cr 3.02. Albumin 3.2, AST 89, ALT 94, Alk Phos 71, Total Bili 2.1. Lactic acid 8.7. He was given a 500cc fluid bolus and Cardiology asked to see.    Past Medical History:  Diagnosis Date  . Cardiomyopathy ([place])    Left  ventricle dysfunction with LVEF 35-40% by echo March 2009  . Chest pain   . Coronary atherosclerosis of native coronary artery    with large anterior myocardial infarction 1997. PTCA LAD. Distal circumflex and first diagonal DES 2011  . Diabetes ([place])   . Heart failure ([place])   . HTN (hypertension)   . Hypercholesteremia   . Hypercholesteremia   . Hyperlipidemia   . Hyperlipidemia   . Psoriasis   . Sciatica     Past Surgical History:  Procedure Laterality Date  . CORONARY ANGIOPLASTY WITH STENT PLACEMENT     x 2 Dr. Tamala Julian  . LEFT HEART CATH AND CORONARY ANGIOGRAPHY N/A 11/21/2020   Procedure: LEFT HEART CATH AND CORONARY ANGIOGRAPHY;  Surgeon: Belva Crome, MD;  Location: [facility];  Service: Cardiovascular;  Laterality: N/A;  . RIGHT HEART CATH N/A 11/16/2020   Procedure: RIGHT HEART CATH;  Surgeon: Belva Crome, MD;  Location: [facility];  Service: Cardiovascular;  Laterality: N/A;     Medications Prior to Admission: Prior to Admission medications   Medication Sig Start Date End Date Taking? Authorizing Provider  acetaminophen (TYLENOL) 500 MG tablet Take 1,000 mg by mouth every 6 (six) hours as needed for mild pain.    [provider]  aspirin EC 81 MG EC tablet Take 1 tablet (81 mg total) by mouth daily. Swallow whole. 12/06/20   Lyda Jester M, PA-C  atorvastatin (LIPITOR) 40 MG tablet TAKE 1 TABLET BY MOUTH EVERY DAY Patient taking differently: Take 40 mg by mouth daily. 08/29/20   Belva Crome, MD  betamethasone dipropionate 0.05 % cream Apply 1 application topically daily as needed (rash). 09/10/19   [provider]  calcium carbonate (TUMS - DOSED IN MG ELEMENTAL CALCIUM) 500 MG chewable tablet Chew 1 tablet by mouth as needed for heartburn.    [provider]  cetirizine-pseudoephedrine (ZYRTEC-D) 5-120 MG tablet Take 1 tablet by mouth daily.    [provider]  diclofenac sodium (VOLTAREN) 1 % GEL Apply 2 g  topically 4 (four) times daily as needed (knee pain).    [provider]  diphenhydrAMINE HCl (ZZZQUIL) 50 MG/30ML LIQD Take 15-30 mLs by mouth at bedtime as needed (sleep).    [provider]  DOBUTamine (DOBUTREX) 4-5 MG/ML-% infusion Inject 284.8 mcg/min into the vein continuous. 12/05/20   Lyda Jester M, PA-C  ezetimibe (ZETIA) 10 MG tablet TAKE 1 TABLET BY MOUTH EVERY DAY Patient taking differently: Take 10 mg by mouth daily. 08/29/20   Belva Crome, MD  glimepiride (AMARYL) 4 MG tablet Take 4 mg by mouth daily. 04/29/15   [provider]  iron polysaccharides (NIFEREX) 150 MG capsule Take 150 mg by mouth daily.    [provider]  Multiple Vitamin (MULTIVITAMIN) capsule Take 1 capsule by mouth daily.    [provider]  nitroGLYCERIN (NITROSTAT) 0.4 MG SL tablet PLACE 1 TABLET UNDER THE TONGUE EVERY 5 MINUTES AS NEEDED FOR CHEST PAIN Patient taking differently: Place 0.4 mg under the tongue every 5 (five) minutes as needed for chest pain. 04/21/20   Belva Crome, MD  omega-3 acid ethyl esters (LOVAZA) 1 g capsule Take 1 capsule (1 g total) by mouth daily. 12/06/20   Lyda Jester M, PA-C  pantoprazole (PROTONIX) 40 MG tablet Take 1 tablet (40 mg total) by mouth daily. 12/05/20   Lyda Jester M, PA-C  potassium chloride SA (KLOR-CON) 20 MEQ tablet Take 2 tablets (40 mEq total) by mouth daily. 12/05/20   Lyda Jester M, PA-C  selenium sulfide (SELSUN) 2.5 % shampoo Apply 1 application topically daily as needed for itching.     [provider]  torsemide 40 MG TABS Take 80 mg by mouth daily. 12/06/20   Consuelo Pandy, PA-C     Allergies:    Allergies  Allergen Reactions  . Candesartan Cilexetil Itching    Other reaction(s): itching, tingling in extrmeities  . Spironolactone     Other reaction(s): GI Upset (intolerance), gynecomastia    Social History:   Social History   Socioeconomic History  . Marital  status: Married    Spouse name: Luellen Pucker  . Number of children: 1  . Years of education: Not on file  . Highest education level: Bachelor's degree (e.g., BA, AB, BS)  Occupational History  . Occupation: retired  Tobacco Use  . Smoking status: Never Smoker  . Smokeless tobacco: Never Used  Vaping Use  . Vaping Use: Never used  Substance and Sexual Activity  . Alcohol use: Yes    Alcohol/week: 0.0 standard drinks    Comment: occasional  . Drug use: No  . Sexual activity: Not on file  Other Topics Concern  . Not on file  Social History Narrative  . Not on file   Social Determinants of Health   Financial Resource Strain: Low Risk   . Difficulty of Paying Living Expenses: Not hard at all  Food Insecurity: No Food Insecurity  . Worried About Charity fundraiser in the Last Year: Never true  . Ran Out of Food in the Last Year: Never true  Transportation Needs: No Transportation Needs  . Lack of Transportation (Medical): No  . Lack of Transportation (Non-Medical): No  Physical Activity: Not on file  Stress: Not on file  Social Connections: Not on file  Intimate Partner Violence: Not on file    Family History:  The patient's family history includes Arrhythmia in his mother; Diabetes in his mother; Healthy in his brother and sister; Heart failure in his mother; Hypertension in his mother; Prostate cancer in his brother and father; Stroke in his father; Thyroid disease in his brother.    ROS:  Please see the history of present illness.  All other ROS reviewed and negative.     Physical Exam/Data:   Vitals:   11/27/2020 1410 12/06/2020 1430 12/13/2020 1500 12/08/2020 1630  BP: (!) 80/67 (!) 82/66 90/65 (!) 79/64  Pulse: (!) 104 (!) 104 (!) 107 98  Resp: 17 (!) 31 18 (!) 33  Temp:      TempSrc:      SpO2: 100% 100% 100% 100%  Weight:      Height:        Intake/Output Summary (Last 24 hours) at 12/16/2020 1636 Last data filed at 12/16/2020 1506 Gross per 24 hour  Intake 500 ml   Output --  Net 500 ml   Last 3 Weights 12/07/2020 12/05/2020 12/03/2020  Weight (lbs) 158 lb 4.8 oz 157 lb 159 lb 13.3 oz  Weight (kg) 71.804 kg 71.215 kg 72.5 kg     Body mass index is 24.79  kg/m.  General: 70 y.o. male resting comfortably in no acute distress. HEENT: Normocephalic and atraumatic. Sclera clear.  Neck: Supple. No carotid bruits. JVD elevated. Heart: Mildly tachycardic with normal rhythm. Distinct S1 and S2. Soft murmur noted. No rubs or gallops. Radial pulses 2+ and equal bilaterally. Lungs: Tachypneic with mild increased work of breathing. Crackles noted in bilateral bases.  Abdomen: Soft, mildly distended, and non-tender to palpation. Bowel sounds present. MSK: Normal strength and tone for age. Extremities: 2+ pitting edema of bilateral lower extremities. Skin: Slightly cool and dry. Neuro: Alert and oriented x3. No focal deficits. Psych: Normal affect. Responds appropriately.  EKG:  The ECG that was done was personally reviewed and demonstrates sinus tachycardia, rate 107, with no acute ST/T changes  Telemetry: Telemetry personally reviewed and demonstrates sinus rhythm with rates in the 90's to low 100's.   Relevant CV Studies:  Echocardiogram 11/12/2020: Impression: 1. Global hypokinesis with akinesis of the apex; overall severe LV  dysfunction.  2. Left ventricular ejection fraction, by estimation, is 20 to 25%. The  left ventricle has severely decreased function. The left ventricle  demonstrates regional wall motion abnormalities (see scoring  diagram/findings for description). The left  ventricular internal cavity size was mildly dilated. Left ventricular  diastolic parameters are indeterminate.  3. Right ventricular systolic function is normal. The right ventricular  size is normal. There is moderately elevated pulmonary artery systolic  pressure.  4. Left atrial size was moderately dilated.  5. Moderate pleural effusion in the left lateral region.   6. The mitral valve is normal in structure. Moderate to severe mitral  valve regurgitation. No evidence of mitral stenosis.  7. The aortic valve is tricuspid. Aortic valve regurgitation is not  visualized. No aortic stenosis is present.  8. The inferior vena cava is dilated in size with >50% respiratory  variability, suggesting right atrial pressure of 8 mmHg.  _______________  Right Cardiac Catheterization 11/16/2020:  Mild pulmonary hypertension with mean PA pressure 36 mmHg  Mean pulmonary capillary wedge pressure 25 mmHg with V wave to 36 mmHg.  Pulmonary artery O2 saturation 46% and 50%.  Thermodilution cardiac output 3.85 L/min; cardiac index 2.0L/min/M2  Fick cardiac output 4.59 L/min and cardiac index 3.85 L/min per meter square  PVR 2.4 Wood units using Fick and 2.86 using thermal dilution.  Overall, the patient is in left heart failure with poor cardiac output and will need inotropic support to clear pulmonary congestion and then hopefully reinitiate heart failure therapy. _______________  Cardiac MRI 11/20/2020: Impressions: 1. Mildly dilated LV with EF 22%, wall motion abnormalities as noted above.  2.  Normal RV size with mildly decreased systolic function, EF 16%.  3. Suspect severe mitral regurgitation (flow sequences to confirm were not done.  4. Delayed enhancement imaging shows extensive LGE in a coronary pattern. The peri-apical segments do not appear viable.  _______________  Left Cardiac Catheterization 11/21/2020:  Severe multivessel CAD  Known chronic total occlusion of the mid LAD.  Left to left collaterals are noted.  Proximal to mid LAD 90% before septal perforators.  Large branching first diagonal with a patent stent in the mid to distal segment beyond the proximal bifurcation.  Eccentric 80% stenosis within a bend in the proximal vessel just after the origin from the LAD.  A smaller branch contains 95% stenosis after the proximal  bifurcation.  30% ostial left main  Diffuse disease in the proximal circumflex with up to 50% narrowing.  The dominant first obtuse marginal  contains segmental 95% stenosis.  The mid first obtuse marginal contains a patent stent.  The continuation of the circumflex gives origin to 2 small obtuse marginal branches.  Right coronary is dominant.  First images were taken on a 10 inch magnification.  There are tandem 85% followed by 75% proximal to mid LAD stenoses.  Distally at the bifurcation with the PDA there is 75% stenosis.  The ostium of the PDA contains 85% stenosis.  The PDA has slight left to right filling by collaterals.  LVEDP 25 mmHg on IV dobutamine.  Recommendations:  There are targets for PCI.  If PCI chosen this would be a high risk procedure and likely would require hemodynamic support.  Targets are the circumflex obtuse marginal, proximal diagonal #1, and proximal to mid RCA.  The distal RCA bifurcation would be more tricky.  Significant mitral regurgitation.  Possible MitraClip target if appropriate.  Need to definitively determine whether surgical revascularization is possible.  Advanced therapies for stage IV CHF will be required to hopefully better stabilize the patient if PCI is to be contemplated. _______________  Laboratory Data:  High Sensitivity Troponin:   Recent Labs  Lab 12/14/2020 1348  TROPONINIHS 94*      Chemistry Recent Labs  Lab 12/05/20 0505 11/20/2020 1348  NA 139 133*  K 3.7 4.9  CL 101 95*  CO2 30 22  GLUCOSE 116* 274*  BUN 45* 50*  CREATININE 2.15* 3.02*  CALCIUM 9.0 9.2  GFRNONAA 33* 22*  ANIONGAP 8 16*    Recent Labs  Lab 11/20/2020 1348  PROT 7.4  ALBUMIN 3.2*  AST 89*  ALT 94*  ALKPHOS 71  BILITOT 2.1*   Hematology Recent Labs  Lab 12/05/20 0505 11/25/2020 1348  WBC 7.9 9.8  RBC 3.19* 3.54*  HGB 8.2* 8.9*  HCT 26.6* 30.6*  MCV 83.4 86.4  MCH 25.7* 25.1*  MCHC 30.8 29.1*  RDW 19.6* 19.6*  PLT 301 267   BNP Recent  Labs  Lab 12/09/2020 1348  BNP 1,918.6*    DDimer No results for input(s): DDIMER in the last 168 hours.   Radiology/Studies:  DG Chest Port 1 View  Result Date: 11/23/2020 CLINICAL DATA:  Shortness of breath. EXAM: PORTABLE CHEST 1 VIEW COMPARISON:  November 11, 2020 FINDINGS: The right PICC line is new in the interval terminating in the central SVC. Cardiomegaly. The hila and mediastinum are unchanged. No pneumothorax. Bilateral diffuse pulmonary opacities may represent multifocal infection versus edema. These opacities are similar in the interval. IMPRESSION: 1. Bilateral pulmonary opacities may represent edema versus multifocal infection. Recommend clinical correlation and short-term follow-up imaging to ensure resolution. 2. The new right PICC line is in good position terminating in the SVC. Electronically Signed   By: Dorise Bullion III M.D   On: 11/19/2020 14:10     Assessment and Plan:   Ischemic Cardiomyopathy Chronic Combined CHF Low Output CHF - Patient recently admitted with low output CHF and went home on Dobutamine. Not felt to be a LVAD candidate at that time due to AKI. Returns today with fatigue, acute shortness of breath, and hypotension. BP reportedly as low as the 50's/30's in the field. Systolic BP in the 63'A in the ED. Received fluids by EMS and in the ED.  - BNP 1,918.  - Chest x-ray showed bilateral pulmonary opacities. - Echo during recent admission showed LVEF of 20-25% with global hypokinesis but akinesis of the apex and moderate to severe MR. RV was normal with moderately elevated PASP. -  He is volume overloaded on exam and labs consistent with low output given AKI, elevated LFTs, and lactic acid of 8.7. - Continue home Dobutamine.  - Will start Levophed. - Ordered STAT COOX.  - Will need diuresis once BP stabilizes. - Monitor daily weight, strict I/O's, and renal function.  - CHF team will see tomorrow.  Of note, signed patient out to overnight fellow who  will check on patient later tonight. If BP has stabilized, may be able to start Lasix.  Elevated Lactic Acid - Lactic acid 8.7.  - WBC normal. Afebrile. - Suspect secondary to low output. Blood cultures and repeat lactic acid have been ordered.  CAD - s/p PTCA to LAD with subsequent DES to distal LCX and 1st Diag in 2011. LHC during recent admission showed severe multivessel CAD. See full report above. Cardiac MRI showed low viability so not felt to be a CABG candidate. He does have PCI targets but there was concern for current CIN so patient was medically treated.  - Initial high-sensitivity troponin in the 90's. Will repeat. So far consistent with demand ischemia. - No chest pain. - Continue aspirin, statin, Zetia.  Severe MR - Noted Echo during recent admission.  - Felt to be functional. LV not markedly dilated with LVEDD of 5.8 cm.  - May be a MitraClip candidate but plan during last admission was to wait for renal recovery.  Hyperlipidemia - Continue Lipitor 46m daily, Zetia 150mdaily, and Lovaza.  Type 2 Diabetes Mellitus - On Glimepiride at home.  - Will start sliding scale insulin here.  Acute on CKD Stage III - Creatinine 2.02 on admission. Peaked at 4.05 during recent admission but had improved to 2.15 at time of discharge. Baseline 1.6 to 1.8. - Secondary to low output. - Continue to monitor.  Chronic Anemia - Hemoglobin stable at 8.9 today.  - Continue iron. - Continue to monitor closely.   Risk Assessment/Risk Scores:     New York Heart Association (NYHA) Functional Class NYHA Class IV     Severity of Illness: The appropriate patient status for this patient is INPATIENT. Inpatient status is judged to be reasonable and necessary in order to provide the required intensity of service to ensure the patient's safety. The patient's presenting symptoms, physical exam findings, and initial radiographic and laboratory data in the context of their chronic  comorbidities is felt to place them at high risk for further clinical deterioration. Furthermore, it is not anticipated that the patient will be medically stable for discharge from the hospital within 2 midnights of admission. The following factors support the patient status of inpatient.   " The patient's presenting symptoms as above. " The worrisome physical exam findings as above. " The initial radiographic and laboratory data as above. " The chronic co-morbidities include CAD, CHF, HDL, DM.   * I certify that at the point of admission it is my clinical judgment that the patient will require inpatient hospital care spanning beyond 2 midnights from the point of admission due to high intensity of service, high risk for further deterioration and high frequency of surveillance required.*    For questions or updates, please contact [facility] consult [email or web address] for contact info under     Signed, CaDarreld McleanPA-C  12/08/2020 4:36 PM

## 2020-12-10 NOTE — ED Triage Notes (Signed)
Pt brought in via [facility] with c/c of hypotension from home. Per EMS pts recently had PICC line placed to remove fluid. Pts had increased weakness with dyspnea.   80/?? --> along with decreased LOC and syncopal --> 50/30. Pt given fluids via EMS and become A&O. BS 342. Pt arrived A&O speaking. Pt started on pump on Tuesday. Denies Chest Pain. Pt endorsees coughing up clear secretions. Pt usually on Room Air at home but arrived on 6L via [place].

## 2020-12-10 NOTE — Progress Notes (Signed)
RN notified that the patient is tachypneic, tachycardic, very SOB  Patient admitted today for Cardiogenic Shock- markedly volume up BP low 88/60s On exam- JVD up, lungs crackles, on 6lts oxygen, tachypneic, LE edema 4+, luke warm  Currently on norepi 10 and dobutamine 68mg Labs- repeat lacate, BMP pending,   Begin IV diuresis (no bumex gtt available), so give IV lasix '100mg'$  x1 and IV lasix '20mg'$ /hr gtt F/w labs Continue above rx.  RRenae Fickle MD Cardiology coverage.

## 2020-12-10 NOTE — ED Provider Notes (Signed)
[name] Provider Note   CSN: [id number] Arrival date & time: 12/12/2020  1317     History Chief Complaint  Patient presents with  . Hypotension    Pt brought in via [facility] with c/c of hypotension from home. Per EMS pts recently had PICC line placed to remove fluid. Pts had increased weakness with dyspnea.   80/?? --> along with decreased LOC and syncopal --> 50/30. Pt given fluids via EMS and become A&O. BS 342. Pt arrived A&O speaking. Pt started on pump on Tuesday. Denies Chest Pain. Pt endorsees coughing up clear secretions. Pt usually on Room Air at home but arrived on 6L via [place].      Marc [name] is a 70 y.o. male.  HPI   70 year old male with past medical history of CAD, HTN, HLD, heart failure patient with home dobutamine pump and follows with Dr. Haroldine Laws presents to the emergency department ambulance for concern of hypotension and shortness of breath.  Patient states today he became acutely short of breath, EMS states that in route his systolic dropped to the [id number] and he became altered.  He responded to fluid bolus and on arrival is sitting up and awake with supplemental oxygen/nasal cannula.  He endorses a cough of white frothy sputum, denies any fever.  No active chest pain, intermittent swelling of his lower extremities.  He is currently receiving dobutamine through PICC line as an outpatient, was recently discharged couple days ago.  Past Medical History:  Diagnosis Date  . Cardiomyopathy ([place])    Left ventricle dysfunction with LVEF 35-40% by echo March 2009  . Chest pain   . Coronary atherosclerosis of native coronary artery    with large anterior myocardial infarction 1997. PTCA LAD. Distal circumflex and first diagonal DES 2011  . Diabetes ([place])   . Heart failure ([place])   . HTN (hypertension)   . Hypercholesteremia   . Hypercholesteremia   . Hyperlipidemia   . Hyperlipidemia   . Psoriasis   . Sciatica      Patient Active Problem List   Diagnosis Date Noted  . Acute renal failure superimposed on stage 3b chronic kidney disease ([place])   . Iron (Fe) deficiency anemia 11/11/2020    Class: Acute  . CHF (congestive heart failure) ([place]) 11/11/2020  . Ischemic cardiomyopathy 11/11/2020  . Mixed hyperlipidemia due to type 2 diabetes mellitus ([place]) 11/11/2020  . Dyspnea on exertion 11/11/2020  . GERD without esophagitis 11/11/2020  . Chronic kidney disease, stage 3b ([place]) 07/19/2015  . Chronic combined systolic and diastolic CHF, NYHA class 2 ([place])   . Cardiomyopathy ([place])   . Coronary artery disease involving native coronary artery of native heart without angina pectoris   . HTN (hypertension)   . Type 2 diabetes mellitus with stage 3 chronic kidney disease, without long-term current use of insulin ([place])   . Sciatica   . Chest pain   . Psoriasis     Past Surgical History:  Procedure Laterality Date  . CORONARY ANGIOPLASTY WITH STENT PLACEMENT     x 2 Dr. Tamala Julian  . LEFT HEART CATH AND CORONARY ANGIOGRAPHY N/A 11/21/2020   Procedure: LEFT HEART CATH AND CORONARY ANGIOGRAPHY;  Surgeon: Belva Crome, MD;  Location: [facility];  Service: Cardiovascular;  Laterality: N/A;  . RIGHT HEART CATH N/A 11/16/2020   Procedure: RIGHT HEART CATH;  Surgeon: Belva Crome, MD;  Location: [facility];  Service: Cardiovascular;  Laterality:  N/A;       Family History  Problem Relation Age of Onset  . Arrhythmia Mother   . Diabetes Mother   . Heart failure Mother   . Hypertension Mother   . Prostate cancer Father   . Stroke Father   . Thyroid disease Brother   . Healthy Sister   . Healthy Brother   . Prostate cancer Brother     Social History   Tobacco Use  . Smoking status: Never Smoker  . Smokeless tobacco: Never Used  Vaping Use  . Vaping Use: Never used  Substance Use Topics  . Alcohol use: Yes    Alcohol/week: 0.0 standard drinks    Comment: occasional  . Drug use: No     Home Medications Prior to Admission medications   Medication Sig Start Date End Date Taking? Authorizing Provider  acetaminophen (TYLENOL) 500 MG tablet Take 1,000 mg by mouth every 6 (six) hours as needed for mild pain.    [provider]  aspirin EC 81 MG EC tablet Take 1 tablet (81 mg total) by mouth daily. Swallow whole. 12/06/20   Lyda Jester M, PA-C  atorvastatin (LIPITOR) 40 MG tablet TAKE 1 TABLET BY MOUTH EVERY DAY Patient taking differently: Take 40 mg by mouth daily. 08/29/20   Belva Crome, MD  betamethasone dipropionate 0.05 % cream Apply 1 application topically daily as needed (rash). 09/10/19   [provider]  calcium carbonate (TUMS - DOSED IN MG ELEMENTAL CALCIUM) 500 MG chewable tablet Chew 1 tablet by mouth as needed for heartburn.    [provider]  cetirizine-pseudoephedrine (ZYRTEC-D) 5-120 MG tablet Take 1 tablet by mouth daily.    [provider]  diclofenac sodium (VOLTAREN) 1 % GEL Apply 2 g topically 4 (four) times daily as needed (knee pain).    [provider]  diphenhydrAMINE HCl (ZZZQUIL) 50 MG/30ML LIQD Take 15-30 mLs by mouth at bedtime as needed (sleep).    [provider]  DOBUTamine (DOBUTREX) 4-5 MG/ML-% infusion Inject 284.8 mcg/min into the vein continuous. 12/05/20   Lyda Jester M, PA-C  ezetimibe (ZETIA) 10 MG tablet TAKE 1 TABLET BY MOUTH EVERY DAY Patient taking differently: Take 10 mg by mouth daily. 08/29/20   Belva Crome, MD  glimepiride (AMARYL) 4 MG tablet Take 4 mg by mouth daily. 04/29/15   [provider]  iron polysaccharides (NIFEREX) 150 MG capsule Take 150 mg by mouth daily.    [provider]  Multiple Vitamin (MULTIVITAMIN) capsule Take 1 capsule by mouth daily.    [provider]  nitroGLYCERIN (NITROSTAT) 0.4 MG SL tablet PLACE 1 TABLET UNDER THE TONGUE EVERY 5 MINUTES AS NEEDED FOR CHEST PAIN Patient taking differently: Place 0.4 mg under  the tongue every 5 (five) minutes as needed for chest pain. 04/21/20   Belva Crome, MD  omega-3 acid ethyl esters (LOVAZA) 1 g capsule Take 1 capsule (1 g total) by mouth daily. 12/06/20   Lyda Jester M, PA-C  pantoprazole (PROTONIX) 40 MG tablet Take 1 tablet (40 mg total) by mouth daily. 12/05/20   Lyda Jester M, PA-C  potassium chloride SA (KLOR-CON) 20 MEQ tablet Take 2 tablets (40 mEq total) by mouth daily. 12/05/20   Lyda Jester M, PA-C  selenium sulfide (SELSUN) 2.5 % shampoo Apply 1 application topically daily as needed for itching.     [provider]  torsemide 40 MG TABS Take 80 mg by mouth daily. 12/06/20   Rosita Fire,  Brittainy M, PA-C    Allergies    Candesartan cilexetil and Spironolactone  Review of Systems   Review of Systems  Constitutional: Positive for fatigue. Negative for chills and fever.  HENT: Negative for congestion.   Eyes: Negative for visual disturbance.  Respiratory: Positive for shortness of breath. Negative for chest tightness.   Cardiovascular: Positive for leg swelling. Negative for chest pain and palpitations.  Gastrointestinal: Negative for abdominal pain, diarrhea and vomiting.  Genitourinary: Negative for dysuria.  Skin: Negative for rash.  Neurological: Negative for headaches.    Physical Exam Updated Vital Signs BP (!) 80/67   Pulse (!) 104   Temp 98.6 F (37 C) (Rectal)   Resp 17   Ht '5\' 7"'$  (1.702 m)   Wt 71.8 kg   SpO2 100%   BMI 24.79 kg/m   Physical Exam Vitals and nursing note reviewed.  Constitutional:      Appearance: Normal appearance.  HENT:     Head: Normocephalic.     Mouth/Throat:     Mouth: Mucous membranes are moist.  Cardiovascular:     Rate and Rhythm: Normal rate.  Pulmonary:     Breath sounds: Rales present.     Comments: Tachypneic, on 6 L of nasal cannula Abdominal:     Palpations: Abdomen is soft.     Tenderness: There is no abdominal tenderness.  Musculoskeletal:        General:  Swelling present.  Skin:    General: Skin is warm.  Neurological:     Mental Status: He is alert and oriented to person, place, and time. Mental status is at baseline.  Psychiatric:        Mood and Affect: Mood normal.     ED Results / Procedures / Treatments   Labs (all labs ordered are listed, but only abnormal results are displayed) Labs Reviewed  CBC WITH DIFFERENTIAL/PLATELET - Abnormal; Notable for the following components:      Result Value   RBC 3.54 (*)    Hemoglobin 8.9 (*)    HCT 30.6 (*)    MCH 25.1 (*)    MCHC 29.1 (*)    RDW 19.6 (*)    Neutro Abs 8.8 (*)    Lymphs Abs 0.5 (*)    All other components within normal limits  RESP PANEL BY RT-PCR (FLU A&B, COVID) ARPGX2  COMPREHENSIVE METABOLIC PANEL  BRAIN NATRIURETIC PEPTIDE  LACTIC ACID, PLASMA  TROPONIN I (HIGH SENSITIVITY)    EKG EKG Interpretation  Date/Time:  Sunday December 10 2020 13:26:43 EDT Ventricular Rate:  107 PR Interval:  144 QRS Duration: 141 QT Interval:  389 QTC Calculation: 519 R Axis:   233 Text Interpretation: Ectopic atrial tachycardia, unifocal Nonspecific intraventricular conduction delay Consider anterior infarct No change from previous Confirmed by Lavenia Atlas [phone]) on 11/27/2020 1:36:54 PM   Radiology DG Chest Port 1 View  Result Date: 12/02/2020 CLINICAL DATA:  Shortness of breath. EXAM: PORTABLE CHEST 1 VIEW COMPARISON:  November 11, 2020 FINDINGS: The right PICC line is new in the interval terminating in the central SVC. Cardiomegaly. The hila and mediastinum are unchanged. No pneumothorax. Bilateral diffuse pulmonary opacities may represent multifocal infection versus edema. These opacities are similar in the interval. IMPRESSION: 1. Bilateral pulmonary opacities may represent edema versus multifocal infection. Recommend clinical correlation and short-term follow-up imaging to ensure resolution. 2. The new right PICC line is in good position terminating in the SVC.  Electronically Signed   By: Dorise Bullion III  M.D   On: 12/06/2020 14:10    Procedures .Critical Care Performed by: Lorelle Gibbs, DO Authorized by: Lorelle Gibbs, DO   Critical care provider statement:    Critical care time (minutes):  45   Critical care was necessary to treat or prevent imminent or life-threatening deterioration of the following conditions:  Respiratory failure, cardiac failure and shock   Critical care was time spent personally by me on the following activities:  Discussions with consultants, evaluation of patient's response to treatment, examination of patient, ordering and performing treatments and interventions, ordering and review of laboratory studies, ordering and review of radiographic studies, pulse oximetry, re-evaluation of patient's condition, obtaining history from patient or surrogate and review of old charts     Medications Ordered in ED Medications  sodium chloride 0.9 % bolus 500 mL (500 mLs Intravenous New Bag/Given 12/03/2020 1356)    ED Course  I have reviewed the triage vital signs and the nursing notes.  Pertinent labs & imaging results that were available during my care of the patient were reviewed by me and considered in my medical decision making (see chart for details).    MDM Rules/Calculators/A&P                          70 year old male presents the emergency department hypotensive and hypoxic in the setting of CHF.  Patient is also on a continuous dobutamine drip at home.  Heart failure team paged.  Blood work shows a baseline anemia, BNP and troponin are elevated, EKG is unchanged for the patient, no active chest pain.  He has been fluid responsive.  Concern for fluid overloading the patient in the setting of CHF.  Heart failure team has been consulted with, they will be admitting the patient.  Patient continues to be stable on 6 L nasal cannula with tachypnea but no distress.  Patients evaluation and results requires admission  for further treatment and care. Patient agrees with admission plan, offers no new complaints and is stable/unchanged at time of admit.  Final Clinical Impression(s) / ED Diagnoses Final diagnoses:  None    Rx / DC Orders ED Discharge Orders    None       Lorelle Gibbs, DO 12/14/2020 1637

## 2020-12-11 ENCOUNTER — Encounter (HOSPITAL_COMMUNITY): Payer: HMO | Admitting: Internal Medicine

## 2020-12-11 DIAGNOSIS — I469 Cardiac arrest, cause unspecified: Secondary | ICD-10-CM | POA: Diagnosis not present

## 2020-12-11 DIAGNOSIS — R570 Cardiogenic shock: Secondary | ICD-10-CM

## 2020-12-11 DIAGNOSIS — I5043 Acute on chronic combined systolic (congestive) and diastolic (congestive) heart failure: Secondary | ICD-10-CM | POA: Diagnosis not present

## 2020-12-11 DIAGNOSIS — R7401 Elevation of levels of liver transaminase levels: Secondary | ICD-10-CM

## 2020-12-11 DIAGNOSIS — N179 Acute kidney failure, unspecified: Secondary | ICD-10-CM

## 2020-12-11 DIAGNOSIS — I420 Dilated cardiomyopathy: Secondary | ICD-10-CM

## 2020-12-11 DIAGNOSIS — I2510 Atherosclerotic heart disease of native coronary artery without angina pectoris: Secondary | ICD-10-CM

## 2020-12-11 LAB — COMPREHENSIVE METABOLIC PANEL
ALT: 3983 U/L — ABNORMAL HIGH (ref 0–44)
AST: 4843 U/L — ABNORMAL HIGH (ref 15–41)
Albumin: 2.9 g/dL — ABNORMAL LOW (ref 3.5–5.0)
Alkaline Phosphatase: 93 U/L (ref 38–126)
Anion gap: 23 — ABNORMAL HIGH (ref 5–15)
BUN: 58 mg/dL — ABNORMAL HIGH (ref 8–23)
CO2: 16 mmol/L — ABNORMAL LOW (ref 22–32)
Calcium: 9.1 mg/dL (ref 8.9–10.3)
Chloride: 96 mmol/L — ABNORMAL LOW (ref 98–111)
Creatinine, Ser: 4.1 mg/dL — ABNORMAL HIGH (ref 0.61–1.24)
GFR, Estimated: 15 mL/min — ABNORMAL LOW (ref >60)
Glucose, Bld: 184 mg/dL — ABNORMAL HIGH (ref 70–99)
Potassium: 6.3 mmol/L (ref 3.5–5.1)
Sodium: 135 mmol/L (ref 135–145)
Total Bilirubin: 3.7 mg/dL — ABNORMAL HIGH (ref 0.3–1.2)
Total Protein: 6.9 g/dL (ref 6.5–8.1)

## 2020-12-11 LAB — TROPONIN I (HIGH SENSITIVITY)
Troponin I (High Sensitivity): 8001 ng/L (ref <18)
Troponin I (High Sensitivity): 8538 ng/L (ref <18)

## 2020-12-11 LAB — CBC
HCT: 31.7 % — ABNORMAL LOW (ref 39.0–52.0)
Hemoglobin: 9 g/dL — ABNORMAL LOW (ref 13.0–17.0)
MCH: 25.2 pg — ABNORMAL LOW (ref 26.0–34.0)
MCHC: 28.4 g/dL — ABNORMAL LOW (ref 30.0–36.0)
MCV: 88.8 fL (ref 80.0–100.0)
Platelets: 233 10*3/uL (ref 150–400)
RBC: 3.57 MIL/uL — ABNORMAL LOW (ref 4.22–5.81)
RDW: 19.9 % — ABNORMAL HIGH (ref 11.5–15.5)
WBC: 18.1 10*3/uL — ABNORMAL HIGH (ref 4.0–10.5)
nRBC: 0.2 % (ref 0.0–0.2)

## 2020-12-11 LAB — COOXEMETRY PANEL
Carboxyhemoglobin: 0.8 % (ref 0.5–1.5)
Carboxyhemoglobin: 0.9 % (ref 0.5–1.5)
Methemoglobin: 0.7 % (ref 0.0–1.5)
Methemoglobin: 0.9 % (ref 0.0–1.5)
O2 Saturation: 33.3 %
O2 Saturation: 41.8 %
Total hemoglobin: 10.6 g/dL — ABNORMAL LOW (ref 12.0–16.0)
Total hemoglobin: 8.2 g/dL — ABNORMAL LOW (ref 12.0–16.0)

## 2020-12-11 LAB — LACTIC ACID, PLASMA
Lactic Acid, Venous: 10.5 mmol/L (ref 0.5–1.9)
Lactic Acid, Venous: 7.4 mmol/L (ref 0.5–1.9)

## 2020-12-11 LAB — GLUCOSE, CAPILLARY: Glucose-Capillary: 199 mg/dL — ABNORMAL HIGH (ref 70–99)

## 2020-12-11 SURGERY — VENTRICULAR ASSIST DEVICE INSERTION
Anesthesia: LOCAL

## 2020-12-11 MED ORDER — MORPHINE SULFATE (PF) 4 MG/ML IV SOLN
INTRAVENOUS | Status: AC
  Administered 2020-12-11: 10 mg
  Filled 2020-12-11: qty 3

## 2020-12-11 MED ORDER — DEXTROSE 5 % IV SOLN
500.0000 mg | Freq: Once | INTRAVENOUS | Status: AC
  Administered 2020-12-11: 500 mg via INTRAVENOUS
  Filled 2020-12-11: qty 500

## 2020-12-11 MED ORDER — MILRINONE LACTATE IN DEXTROSE 20-5 MG/100ML-% IV SOLN
0.1250 ug/kg/min | INTRAVENOUS | Status: DC
  Administered 2020-12-11: 0.125 ug/kg/min via INTRAVENOUS
  Filled 2020-12-11: qty 100

## 2020-12-11 MED ORDER — SODIUM BICARBONATE 8.4 % IV SOLN
INTRAVENOUS | Status: AC
  Filled 2020-12-11: qty 50

## 2020-12-11 MED ORDER — HEPARIN (PORCINE) 25000 UT/250ML-% IV SOLN
850.0000 [IU]/h | INTRAVENOUS | Status: DC
  Administered 2020-12-11: 850 [IU]/h via INTRAVENOUS
  Filled 2020-12-11: qty 250

## 2020-12-11 MED ORDER — MORPHINE SULFATE (PF) 4 MG/ML IV SOLN: 4.0000 mg | INTRAVENOUS | Status: DC | PRN

## 2020-12-11 MED ORDER — SODIUM BICARBONATE 8.4 % IV SOLN
INTRAVENOUS | Status: AC
  Filled 2020-12-11: qty 100

## 2020-12-11 MED ORDER — ORAL CARE MOUTH RINSE: 15.0000 mL | Freq: Two times a day (BID) | OROMUCOSAL | Status: DC

## 2020-12-11 MED ORDER — LORAZEPAM 2 MG/ML IJ SOLN
2.0000 mg | INTRAMUSCULAR | Status: DC | PRN
  Filled 2020-12-11: qty 2

## 2020-12-11 MED ORDER — MORPHINE SULFATE (PF) 10 MG/ML IV SOLN: 10.0000 mg | INTRAVENOUS | Status: DC | PRN

## 2020-12-11 MED ORDER — EPINEPHRINE HCL 5 MG/250ML IV SOLN IN NS: 3.0000 ug/min | INTRAVENOUS | Status: DC

## 2020-12-11 MED ORDER — EPINEPHRINE HCL 5 MG/250ML IV SOLN IN NS
INTRAVENOUS | Status: AC
  Administered 2020-12-11: 3 ug/min via INTRAVENOUS
  Filled 2020-12-11: qty 250

## 2020-12-11 MED ORDER — EPINEPHRINE 1 MG/10ML IJ SOSY
PREFILLED_SYRINGE | INTRAMUSCULAR | Status: AC
  Filled 2020-12-11: qty 30

## 2020-12-11 MED ORDER — ONDANSETRON HCL 4 MG/2ML IJ SOLN
4.0000 mg | Freq: Once | INTRAMUSCULAR | Status: AC
  Administered 2020-12-11: 4 mg via INTRAVENOUS
  Filled 2020-12-11: qty 2

## 2020-12-11 MED ORDER — NOREPINEPHRINE 16 MG/250ML-% IV SOLN
0.0000 ug/min | INTRAVENOUS | Status: DC
  Filled 2020-12-11: qty 250

## 2020-12-11 MED ORDER — SODIUM ZIRCONIUM CYCLOSILICATE 10 G PO PACK
10.0000 g | PACK | Freq: Once | ORAL | Status: AC
  Administered 2020-12-11: 10 g via ORAL
  Filled 2020-12-11: qty 1

## 2020-12-11 MED ORDER — EPINEPHRINE 1 MG/10ML IJ SOSY
PREFILLED_SYRINGE | INTRAMUSCULAR | Status: AC
  Filled 2020-12-11: qty 20

## 2020-12-11 MED ORDER — NOREPINEPHRINE 4 MG/250ML-% IV SOLN
INTRAVENOUS | Status: AC
  Filled 2020-12-11: qty 250

## 2020-12-11 MED ORDER — MORPHINE SULFATE (PF) 4 MG/ML IV SOLN
4.0000 mg | INTRAVENOUS | Status: DC | PRN
  Filled 2020-12-11: qty 1

## 2020-12-11 MED ORDER — HEPARIN BOLUS VIA INFUSION
2000.0000 [IU] | Freq: Once | INTRAVENOUS | Status: AC
  Administered 2020-12-11: 2000 [IU] via INTRAVENOUS
  Filled 2020-12-11: qty 2000

## 2020-12-15 ENCOUNTER — Encounter (HOSPITAL_COMMUNITY): Payer: HMO | Admitting: Internal Medicine

## 2020-12-15 LAB — CULTURE, BLOOD (ROUTINE X 2)
Culture: NO GROWTH
Culture: NO GROWTH

## 2020-12-17 NOTE — Progress Notes (Signed)
NAME:  Marc [name], MRN:  [id number], DOB:  16-Aug-1951, LOS: 1 ADMISSION DATE:  11/20/2020, CONSULTATION DATE: Dec 31, 2020 REFERRING MD: Heart failure service, CHIEF COMPLAINT: Cardiac arrest  History of Present Illness:  This is a 70 year old gentleman history of coronary artery disease, ischemic cardiomyopathy, baseline ejection fraction 35 to 40%, severe multivessel CAD status post anterior wall MI in 1997 with PCI to the LAD, also additional PCI to the left circumflex and D1 in 2011.  Chronic combined systolic heart failure previous echocardiogram with EF of 20 to 25% in March 2022 severe MR.  Presented with acute decompensated heart failure and AKI.  Patient was high risk consideration for LVAD and more leaning towards transition to home hospice care patient was admitted to the ICU with consultation for consideration of MitraClip.  Unfortunately the patient presented to the ER in shock on home dobutamine.  Also given Bumex and Lasix with little urine output.  On the morning of 2 Dec 31, 2020 patient suffered cardiac arrest.  Pertinent  Medical History   Past Medical History:  Diagnosis Date  . Cardiomyopathy ([place])    Left ventricle dysfunction with LVEF 35-40% by echo March 2009  . Chest pain   . Coronary atherosclerosis of native coronary artery    with large anterior myocardial infarction 1997. PTCA LAD. Distal circumflex and first diagonal DES 2011  . Diabetes ([place])   . Heart failure ([place])   . HTN (hypertension)   . Hypercholesteremia   . Hypercholesteremia   . Hyperlipidemia   . Hyperlipidemia   . Psoriasis   . Sciatica      Significant Hospital Events: Including procedures, antibiotic start and stop dates in addition to other pertinent events   . Cardiac arrest Dec 31, 2020  Interim History / Subjective:  Critically ill intubated mechanical life support  Objective   Blood pressure (!) 108/17, pulse 99, temperature 98.6 F (37 C), temperature source Rectal, resp. rate  (!) 22, height '5\' 7"'$  (1.702 m), weight 72.2 kg, SpO2 100 %.    Vent Mode: PRVC FiO2 (%):  [100 %] 100 % Set Rate:  [22 bmp] 22 bmp Vt Set:  [530 mL] 530 mL PEEP:  [5 cmH20] 5 cmH20 Plateau Pressure:  [23 cmH20] 23 cmH20   Intake/Output Summary (Last 24 hours) at 2020/12/31 [id number] Last data filed at 12-31-2020 0700 Gross per 24 hour  Intake 1663.89 ml  Output 107 ml  Net 1556.89 ml   Filed Weights   12/01/2020 1323 12/31/20 0500  Weight: 71.8 kg 72.2 kg    Examination: General: Chronically ill-appearing gentleman intubated on mechanical life support HENT: NCAT tracheal tube in place Lungs: Bilateral mechanically ventilated breath sounds Cardiovascular: Distant heart tones, S1-S2 Abdomen: Mildly distended Extremities: Cool cold extremities Neuro: Not following commands postarrest GU: Deferred  Labs/imaging that I havepersonally reviewed  (right click and "Reselect all SmartList Selections" daily)  Potassium 6  Resolved Hospital Problem list     Assessment & Plan:   Cardiac arrest Ventricular tachycardia Ischemic cardiomyopathy Acute on chronic systolic heart failure Cardiogenic shock End-stage cardiomyopathy DNR, comfort measures Plan: Discussed with patient's family at bedside following cardiac arrest under recommendations of advanced heart failure service that there was no additional interventions at this time for support. Patient was already maxed on dobutamine, epinephrine, milrinone and norepinephrine. Were unable to maintain systolic blood pressure compatible with life. Patient suffered 3 cardiac arrests during the time we were awaiting family to arrive at bedside.  I was present at bedside during all 3  cardiac arrests. Once patient's wife and daughter were at bedside we had discussions of consideration for transition to comfort care measures. They agree with this plan. Morphine, Ativan used for comfort transition Plans for withdrawal of care.   Labs    CBC: Recent Labs  Lab 12/05/20 0505 11/18/2020 1348 Jan 02, 2021 0315  WBC 7.9 9.8 18.1*  NEUTROABS  --  8.8*  --   HGB 8.2* 8.9* 9.0*  HCT 26.6* 30.6* 31.7*  MCV 83.4 86.4 88.8  PLT 301 267 0000000    Basic Metabolic Panel: Recent Labs  Lab 12/05/20 0505 12/13/2020 1348 Jan 02, 2021 0315  NA 139 133* 135  K 3.7 4.9 6.3*  CL 101 95* 96*  CO2 30 22 16*  GLUCOSE 116* 274* 184*  BUN 45* 50* 58*  CREATININE 2.15* 3.02* 4.10*  CALCIUM 9.0 9.2 9.1  MG 1.7  --   --    GFR: Estimated Creatinine Clearance: 15.9 mL/min (A) (by C-G formula based on SCr of 4.1 mg/dL (H)). Recent Labs  Lab 12/05/20 0505 12/08/2020 1348 11/19/2020 1404 11/27/2020 1949 Jan 02, 2021 0057 01-02-2021 0315 02-Jan-2021 0421  WBC 7.9 9.8  --   --   --  18.1*  --   LATICACIDVEN  --   --  8.7* 9.5* 10.5*  --  7.4*    Liver Function Tests: Recent Labs  Lab 11/28/2020 1348 2021/01/02 0315  AST 89* 4,843*  ALT 94* 3,983*  ALKPHOS 71 93  BILITOT 2.1* 3.7*  PROT 7.4 6.9  ALBUMIN 3.2* 2.9*   No results for input(s): LIPASE, AMYLASE in the last 168 hours. No results for input(s): AMMONIA in the last 168 hours.  ABG    Component Value Date/Time   HCO3 24.5 11/16/2020 1546   TCO2 26 11/16/2020 1546   ACIDBASEDEF 1.0 11/16/2020 1546   O2SAT 41.8 01/02/2021 0540     Coagulation Profile: No results for input(s): INR, PROTIME in the last 168 hours.  Cardiac Enzymes: No results for input(s): CKTOTAL, CKMB, CKMBINDEX, TROPONINI in the last 168 hours.  HbA1C: Hgb A1c MFr Bld  Date/Time Value Ref Range Status  11/12/2020 02:53 AM 5.7 (H) 4.8 - 5.6 % Final    Comment:    (NOTE) Pre diabetes:          5.7%-6.4%  Diabetes:              >6.4%  Glycemic control for   <7.0% adults with diabetes   05/04/2020 10:38 AM 6.6 (H) 4.8 - 5.6 % Final    Comment:             Prediabetes: 5.7 - 6.4          Diabetes: >6.4          Glycemic control for adults with diabetes: <7.0     CBG: Recent Labs  Lab 12/05/20 0626  12/05/20 1125 12/05/20 1545 11/19/2020 2236 01-02-2021 0648  GLUCAP 134* 241* 215* 160* 199*    Review of Systems:   Unable to obtain  Past Medical History:  He,  has a past medical history of Cardiomyopathy ([place]), Chest pain, Coronary atherosclerosis of native coronary artery, Diabetes ([place]), Heart failure ([place]), HTN (hypertension), Hypercholesteremia, Hypercholesteremia, Hyperlipidemia, Hyperlipidemia, Psoriasis, and Sciatica.   Surgical History:   Past Surgical History:  Procedure Laterality Date  . CORONARY ANGIOPLASTY WITH STENT PLACEMENT     x 2 Dr. Tamala Julian  . LEFT HEART CATH AND CORONARY ANGIOGRAPHY N/A 11/21/2020   Procedure: LEFT HEART CATH AND CORONARY ANGIOGRAPHY;  Surgeon:  Belva Crome, MD;  Location: [facility];  Service: Cardiovascular;  Laterality: N/A;  . RIGHT HEART CATH N/A 11/16/2020   Procedure: RIGHT HEART CATH;  Surgeon: Belva Crome, MD;  Location: [facility];  Service: Cardiovascular;  Laterality: N/A;     Social History:   reports that he has never smoked. He has never used smokeless tobacco. He reports current alcohol use. He reports that he does not use drugs.   Family History:  His family history includes Arrhythmia in his mother; Diabetes in his mother; Healthy in his brother and sister; Heart failure in his mother; Hypertension in his mother; Prostate cancer in his brother and father; Stroke in his father; Thyroid disease in his brother.   Allergies Allergies  Allergen Reactions  . Candesartan Cilexetil Itching    Other reaction(s): itching, tingling in extrmeities  . Spironolactone     Other reaction(s): GI Upset (intolerance), gynecomastia     Home Medications  Prior to Admission medications   Medication Sig Start Date End Date Taking? Authorizing Provider  aspirin EC 81 MG EC tablet Take 1 tablet (81 mg total) by mouth daily. Swallow whole. 12/06/20  Yes Simmons, Brittainy M, PA-C  atorvastatin (LIPITOR) 40 MG tablet TAKE 1  TABLET BY MOUTH EVERY DAY Patient taking differently: Take 40 mg by mouth daily. 08/29/20  Yes Belva Crome, MD  betamethasone dipropionate 0.05 % cream Apply 1 application topically daily as needed (rash). 09/10/19  Yes [provider]  calcium carbonate (TUMS - DOSED IN MG ELEMENTAL CALCIUM) 500 MG chewable tablet Chew 1 tablet by mouth as needed for heartburn.   Yes [provider]  cetirizine-pseudoephedrine (ZYRTEC-D) 5-120 MG tablet Take 1 tablet by mouth daily.   Yes [provider]  diphenhydrAMINE HCl (ZZZQUIL) 50 MG/30ML LIQD Take 15-30 mLs by mouth at bedtime as needed (sleep).   Yes [provider]  DOBUTamine (DOBUTREX) 4-5 MG/ML-% infusion Inject 284.8 mcg/min into the vein continuous. 12/05/20  Yes Simmons, Brittainy M, PA-C  ezetimibe (ZETIA) 10 MG tablet TAKE 1 TABLET BY MOUTH EVERY DAY Patient taking differently: Take 10 mg by mouth daily. 08/29/20  Yes Belva Crome, MD  glimepiride (AMARYL) 4 MG tablet Take 4 mg by mouth daily. 04/29/15  Yes [provider]  iron polysaccharides (NIFEREX) 150 MG capsule Take 150 mg by mouth daily.   Yes [provider]  Multiple Vitamin (MULTIVITAMIN) capsule Take 1 capsule by mouth daily.   Yes [provider]  nitroGLYCERIN (NITROSTAT) 0.4 MG SL tablet PLACE 1 TABLET UNDER THE TONGUE EVERY 5 MINUTES AS NEEDED FOR CHEST PAIN Patient taking differently: Place 0.4 mg under the tongue every 5 (five) minutes as needed for chest pain. 04/21/20  Yes Belva Crome, MD  omega-3 acid ethyl esters (LOVAZA) 1 g capsule Take 1 capsule (1 g total) by mouth daily. 12/06/20  Yes Rosita Fire, Brittainy M, PA-C  pantoprazole (PROTONIX) 40 MG tablet Take 1 tablet (40 mg total) by mouth daily. 12/05/20  Yes Rosita Fire, Brittainy M, PA-C  potassium chloride SA (KLOR-CON) 20 MEQ tablet Take 2 tablets (40 mEq total) by mouth daily. 12/05/20  Yes Lyda Jester M, PA-C  selenium sulfide (SELSUN) 2.5 % shampoo Apply  1 application topically daily as needed for itching.    Yes [provider]  torsemide 40 MG TABS Take 80 mg by mouth daily. Patient taking differently: Take 160 mg by mouth daily. 12/06/20  Yes Consuelo Pandy, PA-C  This patient is critically ill with multiple organ system failure; which, requires frequent high complexity decision making, assessment, support, evaluation, and titration of therapies. This was completed through the application of advanced monitoring technologies and extensive interpretation of multiple databases. During this encounter critical care time was devoted to patient care services described in this note for 85 minutes.  Additional time spent with family at bedside discussing goals of care and [place].  Garner Nash, DO [facility] 12/19/20 8:39 AM

## 2020-12-17 NOTE — Progress Notes (Signed)
ANTICOAGULATION CONSULT NOTE - Initial Consult  Pharmacy Consult for Heparin  Indication: chest pain/ACS  Allergies  Allergen Reactions  . Candesartan Cilexetil Itching    Other reaction(s): itching, tingling in extrmeities  . Spironolactone     Other reaction(s): GI Upset (intolerance), gynecomastia    Patient Measurements: Height: '5\' 7"'$  (170.2 cm) Weight: 71.8 kg (158 lb 4.8 oz) IBW/kg (Calculated) : 66.1  Vital Signs: BP: 78/52 (04/25 0515) Pulse Rate: 100 (04/25 0530)  Labs: Recent Labs    11/19/2020 1348 12/08/2020 1949 Dec 21, 2020 0057 12/21/20 0315  HGB 8.9*  --   --  9.0*  HCT 30.6*  --   --  31.7*  PLT 267  --   --  233  CREATININE 3.02*  --   --  4.10*  TROPONINIHS 94* 1,885* 8,001* 8,538*    Estimated Creatinine Clearance: 15.9 mL/min (A) (by C-G formula based on SCr of 4.1 mg/dL (H)).   Medical History: Past Medical History:  Diagnosis Date  . Cardiomyopathy ([place])    Left ventricle dysfunction with LVEF 35-40% by echo March 2009  . Chest pain   . Coronary atherosclerosis of native coronary artery    with large anterior myocardial infarction 1997. PTCA LAD. Distal circumflex and first diagonal DES 2011  . Diabetes ([place])   . Heart failure ([place])   . HTN (hypertension)   . Hypercholesteremia   . Hypercholesteremia   . Hyperlipidemia   . Hyperlipidemia   . Psoriasis   . Sciatica    Assessment: Marc [name] is a 70 y.o. male admitted with cardiogenic shock. Starting a heparin for rising troponin. Renal function is worsening based on Scr and also with minimal UOP. Hgb 9. Plts good.   Goal of Therapy:  Heparin level 0.3-0.7 units/ml Monitor platelets by anticoagulation protocol: Yes   Plan:  Heparin 2000 units BOLUS-reduced bolus with prophylactic dose Lovenox last night Start heparin drip at 850 units/hr 1400 heparin level Daily CBC/heparin level Monitor for bleeding  Narda Bonds, PharmD, BCPS Clinical Pharmacist Phone: [phone]

## 2020-12-17 NOTE — Consult Note (Addendum)
Advanced Heart Failure Team Consult Note   Primary Physician: Lawerance Cruel, MD PCP-Cardiologist:  Sinclair Grooms, MD  Reason for Consultation: Cardiogenic Shock   HPI:    Marc [name] is seen today for evaluation of cardiogenic shock at the request of Dr Rudi Rummage.   Mr Marc [name] CAD and ischemic CM baseline EF 35-40%,  severe multivessel CAD s/p large AWMI in 1997 s/p PCI of the LAD and then PCI of the dLCx and D1 in 2011.  He has an ischemic DCM with chronic combined CHF and EF 20-25% on echo in March 2022, severe MR, recent AKI, and recent cardiogenic shock requiring dobutamine.    Admitted 11/11/20 with acute decompensated heart failure. Developed AKI with attempts at diuresis, underwent subsequent RHC showing volume overload with low output. Diagnostic LHC showed severe MVCAD. He was placed on inotropic support and continued on IV diuretics. Surgical consult was placed for potential CABG. CMRI showed limited viability, thus surgical revascularization was not felt to be of much benefit. VAD was felt to be only durable option but pt wished to consider attempt at PCI first. 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  He does have targets for PCI in OM and RCA, however risk of recurrent CIN felt to be very high. Decision was made to treat medically for now and to reassess in several weeks and consider PCI if renal function improves/ stabilizes. Discussion about possible MitraClip if renal recovery. He was continued on DBA and remained inotrope dependent. He will continue w/ home DBA at 4 mcg/kg/min. He was considered for High-risk VAD but Hospice was looking like they may be only options. Discharged on 12/05/20.   Presented to ED via EMS in recurrent shock despite home dobutamine. Given 2 mg bumex + 100 mg lasix bolus then started on lasix drip. Continued on dobutamine, norepi and epi added. Lactic acid on admit >8.  Creatinine on admit 3., BNP 1918, HS Trop 94>1885, and  SARS 2 negative.   Remained in hypotensive despite multiple pressors. CO-OX 33%.   Remains short of breath at rest.   Review of Systems: [y] = yes, '[ ]'$  = no   . General: Weight gain [Y ]; Weight loss '[ ]'$ ; Anorexia '[ ]'$ ; Fatigue [Y ]; Fever '[ ]'$ ; Chills '[ ]'$ ; Weakness '[ ]'$   . Cardiac: Chest pain/pressure '[ ]'$ ; Resting SOB [Y ]; Exertional SOB [Y ]; Orthopnea [ Y]; Pedal Edema '[ ]'$ ; Palpitations '[ ]'$ ; Syncope '[ ]'$ ; Presyncope '[ ]'$ ; Paroxysmal nocturnal dyspnea'[ ]'$   . Pulmonary: Cough '[ ]'$ ; Wheezing'[ ]'$ ; Hemoptysis'[ ]'$ ; Sputum '[ ]'$ ; Snoring '[ ]'$   . GI: Vomiting'[ ]'$ ; Dysphagia'[ ]'$ ; Melena'[ ]'$ ; Hematochezia '[ ]'$ ; Heartburn'[ ]'$ ; Abdominal pain '[ ]'$ ; Constipation '[ ]'$ ; Diarrhea '[ ]'$ ; BRBPR '[ ]'$   . GU: Hematuria'[ ]'$ ; Dysuria '[ ]'$ ; Nocturia'[ ]'$   . Vascular: Pain in legs with walking '[ ]'$ ; Pain in feet with lying flat '[ ]'$ ; Non-healing sores '[ ]'$ ; Stroke '[ ]'$ ; TIA '[ ]'$ ; Slurred speech '[ ]'$ ;  . Neuro: Headaches'[ ]'$ ; Vertigo'[ ]'$ ; Seizures'[ ]'$ ; Paresthesias'[ ]'$ ;Blurred vision '[ ]'$ ; Diplopia '[ ]'$ ; Vision changes '[ ]'$   . Ortho/Skin: Arthritis '[ ]'$ ; Joint pain [Y ]; Muscle pain '[ ]'$ ; Joint swelling '[ ]'$ ; Back Pain [Y ]; Rash '[ ]'$   . Psych: Depression'[ ]'$ ; Anxiety'[ ]'$   . Heme: Bleeding problems '[ ]'$ ; Clotting disorders '[ ]'$ ; Anemia [Y ]  . Endocrine: Diabetes '[ ]'$ ; Thyroid dysfunction'[ ]'$   Home Medications Prior  to Admission medications   Medication Sig Start Date End Date Taking? Authorizing Provider  aspirin EC 81 MG EC tablet Take 1 tablet (81 mg total) by mouth daily. Swallow whole. 12/06/20  Yes Simmons, Brittainy M, PA-C  atorvastatin (LIPITOR) 40 MG tablet TAKE 1 TABLET BY MOUTH EVERY DAY Patient taking differently: Take 40 mg by mouth daily. 08/29/20  Yes Belva Crome, MD  betamethasone dipropionate 0.05 % cream Apply 1 application topically daily as needed (rash). 09/10/19  Yes [provider]  calcium carbonate (TUMS - DOSED IN MG ELEMENTAL CALCIUM) 500 MG chewable tablet Chew 1 tablet by mouth as needed for heartburn.   Yes [provider]  cetirizine-pseudoephedrine (ZYRTEC-D) 5-120 MG tablet Take 1 tablet by mouth daily.   Yes [provider]  diphenhydrAMINE HCl (ZZZQUIL) 50 MG/30ML LIQD Take 15-30 mLs by mouth at bedtime as needed (sleep).   Yes [provider]  DOBUTamine (DOBUTREX) 4-5 MG/ML-% infusion Inject 284.8 mcg/min into the vein continuous. 12/05/20  Yes Simmons, Brittainy M, PA-C  ezetimibe (ZETIA) 10 MG tablet TAKE 1 TABLET BY MOUTH EVERY DAY Patient taking differently: Take 10 mg by mouth daily. 08/29/20  Yes Belva Crome, MD  glimepiride (AMARYL) 4 MG tablet Take 4 mg by mouth daily. 04/29/15  Yes [provider]  iron polysaccharides (NIFEREX) 150 MG capsule Take 150 mg by mouth daily.   Yes [provider]  Multiple Vitamin (MULTIVITAMIN) capsule Take 1 capsule by mouth daily.   Yes [provider]  nitroGLYCERIN (NITROSTAT) 0.4 MG SL tablet PLACE 1 TABLET UNDER THE TONGUE EVERY 5 MINUTES AS NEEDED FOR CHEST PAIN Patient taking differently: Place 0.4 mg under the tongue every 5 (five) minutes as needed for chest pain. 04/21/20  Yes Belva Crome, MD  omega-3 acid ethyl esters (LOVAZA) 1 g capsule Take 1 capsule (1 g total) by mouth daily. 12/06/20  Yes Rosita Fire, Brittainy M, PA-C  pantoprazole (PROTONIX) 40 MG tablet Take 1 tablet (40 mg total) by mouth daily. 12/05/20  Yes Rosita Fire, Brittainy M, PA-C  potassium chloride SA (KLOR-CON) 20 MEQ tablet Take 2 tablets (40 mEq total) by mouth daily. 12/05/20  Yes Lyda Jester M, PA-C  selenium sulfide (SELSUN) 2.5 % shampoo Apply 1 application topically daily as needed for itching.    Yes [provider]  torsemide 40 MG TABS Take 80 mg by mouth daily. Patient taking differently: Take 160 mg by mouth daily. 12/06/20  Yes Consuelo Pandy, PA-C    Past Medical History: Past Medical History:  Diagnosis Date  . Cardiomyopathy ([place])    Left ventricle dysfunction with LVEF 35-40% by echo March 2009   . Chest pain   . Coronary atherosclerosis of native coronary artery    with large anterior myocardial infarction 1997. PTCA LAD. Distal circumflex and first diagonal DES 2011  . Diabetes ([place])   . Heart failure ([place])   . HTN (hypertension)   . Hypercholesteremia   . Hypercholesteremia   . Hyperlipidemia   . Hyperlipidemia   . Psoriasis   . Sciatica     Past Surgical History: Past Surgical History:  Procedure Laterality Date  . CORONARY ANGIOPLASTY WITH STENT PLACEMENT     x 2 Dr. Tamala Julian  . LEFT HEART CATH AND CORONARY ANGIOGRAPHY N/A 11/21/2020   Procedure: LEFT HEART CATH AND CORONARY ANGIOGRAPHY;  Surgeon: Belva Crome, MD;  Location: [facility];  Service: Cardiovascular;  Laterality: N/A;  . RIGHT HEART CATH  N/A 11/16/2020   Procedure: RIGHT HEART CATH;  Surgeon: Belva Crome, MD;  Location: [facility];  Service: Cardiovascular;  Laterality: N/A;    Family History: Family History  Problem Relation Age of Onset  . Arrhythmia Mother   . Diabetes Mother   . Heart failure Mother   . Hypertension Mother   . Prostate cancer Father   . Stroke Father   . Thyroid disease Brother   . Healthy Sister   . Healthy Brother   . Prostate cancer Brother     Social History: Social History   Socioeconomic History  . Marital status: Married    Spouse name: Luellen Pucker  . Number of children: 1  . Years of education: Not on file  . Highest education level: Bachelor's degree (e.g., BA, AB, BS)  Occupational History  . Occupation: retired  Tobacco Use  . Smoking status: Never Smoker  . Smokeless tobacco: Never Used  Vaping Use  . Vaping Use: Never used  Substance and Sexual Activity  . Alcohol use: Yes    Alcohol/week: 0.0 standard drinks    Comment: occasional  . Drug use: No  . Sexual activity: Not on file  Other Topics Concern  . Not on file  Social History Narrative  . Not on file   Social Determinants of Health   Financial Resource Strain: Low Risk    . Difficulty of Paying Living Expenses: Not hard at all  Food Insecurity: No Food Insecurity  . Worried About Charity fundraiser in the Last Year: Never true  . Ran Out of Food in the Last Year: Never true  Transportation Needs: No Transportation Needs  . Lack of Transportation (Medical): No  . Lack of Transportation (Non-Medical): No  Physical Activity: Not on file  Stress: Not on file  Social Connections: Not on file    Allergies:  Allergies  Allergen Reactions  . Candesartan Cilexetil Itching    Other reaction(s): itching, tingling in extrmeities  . Spironolactone     Other reaction(s): GI Upset (intolerance), gynecomastia    Objective:    Vital Signs:   Temp:  [98.6 F (37 C)] 98.6 F (37 C) (04/24 1323) Pulse Rate:  [90-111] 102 (04/25 0700) Resp:  [17-51] 51 (04/25 0700) BP: (69-101)/(50-77) 79/55 (04/25 0700) SpO2:  [95 %-100 %] 96 % (04/25 0700) Weight:  [71.8 kg-72.2 kg] 72.2 kg (04/25 0500) Last BM Date: 01-10-2021  Weight change: Filed Weights   12/01/2020 1323 01/10/2021 0500  Weight: 71.8 kg 72.2 kg    Intake/Output:   Intake/Output Summary (Last 24 hours) at 10-Jan-2021 0728 Last data filed at Jan 10, 2021 0600 Gross per 24 hour  Intake 1464.06 ml  Output 107 ml  Net 1357.06 ml      Physical Exam    General: Dyspneic at rest.  HEENT: normal Neck: supple. JVP to jaw  . Carotids 2+ bilat; no bruits. No lymphadenopathy or thyromegaly appreciated. Cor: PMI nondisplaced. Regular rate & rhythm. No rubs. +S3 3/6 MR. Lungs: clear Abdomen: soft, nontender, nondistended. No hepatosplenomegaly. No bruits or masses. Good bowel sounds. Extremities: cool extremities, no cyanosis, clubbing, rash, edema Neuro: alert & orientedx3, cranial nerves grossly intact. moves all 4 extremities w/o difficulty. Affect flat    Telemetry   ST --PEA arrest EKG    Sinus Tach 107 bpm on arrival.   Labs   Basic Metabolic Panel: Recent Labs  Lab 12/05/20 0505  12/14/2020 1348 10-Jan-2021 0315  NA 139 133* 135  K  3.7 4.9 6.3*  CL 101 95* 96*  CO2 30 22 16*  GLUCOSE 116* 274* 184*  BUN 45* 50* 58*  CREATININE 2.15* 3.02* 4.10*  CALCIUM 9.0 9.2 9.1  MG 1.7  --   --     Liver Function Tests: Recent Labs  Lab 11/18/2020 1348 2021/01/03 0315  AST 89* 4,843*  ALT 94* 3,983*  ALKPHOS 71 93  BILITOT 2.1* 3.7*  PROT 7.4 6.9  ALBUMIN 3.2* 2.9*   No results for input(s): LIPASE, AMYLASE in the last 168 hours. No results for input(s): AMMONIA in the last 168 hours.  CBC: Recent Labs  Lab 12/05/20 0505 12/09/2020 1348 01/03/21 0315  WBC 7.9 9.8 18.1*  NEUTROABS  --  8.8*  --   HGB 8.2* 8.9* 9.0*  HCT 26.6* 30.6* 31.7*  MCV 83.4 86.4 88.8  PLT 301 267 233    Cardiac Enzymes: No results for input(s): CKTOTAL, CKMB, CKMBINDEX, TROPONINI in the last 168 hours.  BNP: BNP (last 3 results) Recent Labs    11/11/20 2158 11/26/2020 1348  BNP 1,728.5* 1,918.6*    ProBNP (last 3 results) Recent Labs    10/24/20 1119  PROBNP 1,685*     CBG: Recent Labs  Lab 12/05/20 0626 12/05/20 1125 12/05/20 1545 11/30/2020 2236 2021/01/03 0648  GLUCAP 134* 241* 215* 160* 199*    Coagulation Studies: No results for input(s): LABPROT, INR in the last 72 hours.   Imaging   DG Chest Port 1 View  Result Date: 12/13/2020 CLINICAL DATA:  Shortness of breath. EXAM: PORTABLE CHEST 1 VIEW COMPARISON:  November 11, 2020 FINDINGS: The right PICC line is new in the interval terminating in the central SVC. Cardiomegaly. The hila and mediastinum are unchanged. No pneumothorax. Bilateral diffuse pulmonary opacities may represent multifocal infection versus edema. These opacities are similar in the interval. IMPRESSION: 1. Bilateral pulmonary opacities may represent edema versus multifocal infection. Recommend clinical correlation and short-term follow-up imaging to ensure resolution. 2. The new right PICC line is in good position terminating in the SVC.  Electronically Signed   By: Dorise Bullion III M.D   On: 11/27/2020 14:10      Medications:     Current Medications: . aspirin EC  81 mg Oral Daily  . atorvastatin  40 mg Oral Daily  . Chlorhexidine Gluconate Cloth  6 each Topical Daily  . ezetimibe  10 mg Oral Daily  . insulin aspart  0-9 Units Subcutaneous TID WC  . iron polysaccharides  150 mg Oral Daily  . omega-3 acid ethyl esters  1 g Oral Daily  . pantoprazole  40 mg Oral Daily  . sodium chloride flush  10-40 mL Intracatheter Q12H     Infusions: . DOBUTamine 6 mcg/kg/min (03-Jan-2021 0600)  . epinephrine 3 mcg/min (01-03-2021 0644)  . furosemide (LASIX) 200 mg in dextrose 5% 100 mL ('2mg'$ /mL) infusion 20 mg/hr (01/03/21 0621)  . heparin 850 Units/hr (2021/01/03 0703)  . milrinone 0.125 mcg/kg/min (2021-01-03 0600)  . norepinephrine (LEVOPHED) Adult infusion         Assessment/Plan  1. Cardiogenic Shock  ICM. cMRI 11/20/20 EF 22% with mild-mod RV dysfunction. Diffuse LGE suggestive of severe ischemic CM. Little viable myocardium -Despite multiple pressors hypotensive.  -Lactic Acid 8.7 -->  2. Cardiac Arrest, VT/V fib  CPR , shock x2, and multiple rounds of epi.  3. Acute Hypoxic Respiratory Failure Urgent intubation. CCM consulted.  4. AKI Creatinine on admit 3-->4  5. Elevated LFTS  6. Anemia  7.  Hyperkalemia  8. Severe MR   After Dr Haroldine Laws left the room he had cardiac arrest --> VT/VF this morning requiring CPR, shock, multiple rounds of epi, urgent intubation. ROSC. Placed on max pressors. His wife was called and requested intubation.   Not a candidate for advanced therapies with MSOF.    Length of Stay: 1  Amy Clegg, NP  12-24-20, 7:28 AM  Advanced Heart Failure Team Pager [phone] (M-F; 7a - 5p)  Please contact [facility] for night-coverage after hours (4p -7a ) and weekends on [email or web address]  Agree.  70 y/o as above with end-stage systolic HF, severe MR, CKD IV and transfusion dependent anemia.  Just discharged after prolonged admission for shock and AKI. VAD and MitraClip discussed at that time but patient felt not a candidate and was not interested in VAD.   Readmitted with CGS and multi-system organ failure  This am on rounds developed asystolic arrest and was intubated. Prolonged CPR with ROSC but minimal BP. ECHO with EF 15-20%   General:  Intubated HEENT: normal + ETT Neck: supple JVP to ear Carotids 2+ bilat; no bruits. No lymphadenopathy or thryomegaly appreciated. Cor: PMI nondisplaced. Irregular rate & rhythm.  Lungs:coarse Abdomen: soft, nontender, nondistended. No hepatosplenomegaly. No bruits or masses. Good bowel sounds. Extremities: no cyanosis, clubbing, rash, cool 2+ edema Neuro: intubated unresponsive   He has end-stage HF with MSOF now s/p prolonged cardiac arrest. Unfortunately he has no options remaining. Await family to arrive and wil transition to comfort care.   CRITICAL CARE Performed by: Glori Bickers  Total critical care time: 90 minutes  Critical care time was exclusive of separately billable procedures and treating other patients.  Critical care was necessary to treat or prevent imminent or life-threatening deterioration.  Critical care was time spent personally by me (independent of midlevel providers or residents) on the following activities: development of treatment plan with patient and/or surrogate as well as nursing, discussions with consultants, evaluation of patient's response to treatment, examination of patient, obtaining history from patient or surrogate, ordering and performing treatments and interventions, ordering and review of laboratory studies, ordering and review of radiographic studies, pulse oximetry and re-evaluation of patient's condition.  Glori Bickers, MD  8:17 AM

## 2020-12-17 NOTE — Progress Notes (Signed)
   Jan 05, 2021 0758  Clinical Encounter Type  Visit Type Code  Referral From Nurse  Consult/Referral To Chaplain  Mr. Shallow went into cardiac arrest and upon arrival the Med Team was issuing CPR.  Mrs. Maidonado was call and she requested please continue CPR until she arrives.Family had not arrived, and Chaplain was not needed at this time. Will pass this on to the day Chaplains to follow-up on Mrs. Shield, family is not taking this well and will need support.   Chaplain Ramil Ed[place] [phone]

## 2020-12-17 NOTE — Discharge Summary (Signed)
    Advanced Heart Failure Death Summary  Death Summary   Patient ID: Marc [name] MRN: [id number], DOB/AGE: 11/19/1950 70 y.o. Admit date: 12/06/2020 D/C date:     01/09/21   Primary Discharge Diagnoses:  1. Cardiogenic Shock  2. Cardiac Arrest, VT/V fib  3. Acute Hypoxic Respiratory Failure 4. AKI 5. Elevated LFTS  6. Anemia  7. Hyperkalemia  8. Severe Marc    Hospital Course:  Marc [name] was a 70 year old with CAD and ischemic CM baseline EF 35-40%, severe multivessel CAD s/p large AWMI in 1997 s/p PCI of the LAD and then PCI of the dLCx and D1 in 2011. He had an ischemic DCM with chronic combined CHF and EF 20-25% on echo in March 2022, severe Marc, recentAKI, and recent cardiogenic shock requiring dobutamine.   Earlier this month he had prolonged hospitalization due to cardiogenic shock and AKI. Discharged on dobutamine 4 mcg.    Presented to ED via EMS in recurrent shock and MSOF despite home dobutamine. Lactic acid on admit >8.  Placed on multiple pressors with worsening condition. Today he had cardiac arrest with VT/VF this morning requiring CPR, shock, multiple rounds of epi, urgent intubation. Had ROSC. Placed on max pressors. He again arrested. Dr Valeta Harms discussed with Marc [name] and due to severe MSOF he was transitioned to comfort care.    Consultations  Advanced Heart Failure  CCM Chaplain    Duration of Discharge Encounter: Greater than 35 minutes   Signed, Darrick Grinder, NP 01/09/21, 11:35 AM

## 2020-12-17 NOTE — Procedures (Signed)
Cardiopulmonary Resuscitation Note  Marc [name]  [id number]  1951/04/02  Date:17-Dec-2020  Time:8:41 AM   Provider Performing:Rustyn Conery L Xochilt Conant   Procedure: Cardiopulmonary Resuscitation (92950)  Indication(s) Loss of Pulse  Consent N/A  Anesthesia N/A  Time Out N/A  Sterile Technique Hand hygiene, gloves  Procedure Description Called to patient's room for CODE BLUE. Initial rhythm was Vfib/Vtach. Patient received high quality chest compressions for total of 34 mins (two separate events) with defibrillation or cardioversion when appropriate. Epinephrine was administered every 3 minutes as directed by time Therapist, nutritional. Additional pharmacologic interventions included calcium chloride, sodium bicarbonate and D50 + insuling. Additional procedural interventions include bedside echo.  Return of spontaneous circulation was achieved.  Family to be notified. Dr. Haroldine Laws called and spoke with wife during first cardiac arrest.   Complications/Tolerance N/A  EBL N/A  Specimen(s) N/A  Estimated time to ROSC: 26 minutes first event, 8 minutes after second event   Garner Nash, DO [facility] 12/17/20 8:45 AM

## 2020-12-17 NOTE — Procedures (Signed)
**Note Marc-Identified via Obfuscation** Intubation Procedure Note  DEMORRIS [name]  [id number]  1951/04/19  Date:06-Jan-2021  Time:8:16 AM   Provider Performing:Marc [name]    Procedure: Intubation (31500)  Indication(s) Respiratory Failure  Consent Unable to obtain consent due to emergent nature of procedure.   Anesthesia during cardiac arrest, no sedation    Time Out Verified patient identification, verified procedure, site/side was marked, verified correct patient position, special equipment/implants available, medications/allergies/relevant history reviewed, required imaging and test results available.   Sterile Technique Usual hand hygeine, masks, and gloves were used   Procedure Description Patient positioned in bed supine.  Sedation given as noted above.  Patient was intubated with endotracheal tube using Glidescope.  View was Grade 1 full glottis .  Number of attempts was 2.  Colorimetric CO2 detector was consistent with tracheal placement.   Complications/Tolerance None; patient tolerated the procedure well. Chest X-ray is ordered to verify placement.   EBL Minimal   Specimen(s) None  Marc Nash, DO [facility] 01/06/21 8:16 AM

## 2020-12-17 NOTE — Progress Notes (Signed)
Chaplain Ulis Rias responded to request from [employer] (who was getting off shift) to visit with patient and his family. Patient had coded and death had been called. Chaplain prayed with daughter and wife who were bedside. Patient was extubated and room cleaned and wife, daughter, pastor and patient's brother went back into room. Chaplain gave daughter patient placement card and is available when needed.    12/23/20 0900  Clinical Encounter Type  Visited With Patient and family together  Visit Type Death  Referral From Chaplain  Consult/Referral To Chaplain  Stress Factors  Family Stress Factors Loss

## 2020-12-17 NOTE — Progress Notes (Addendum)
Follow up note  Vitals noted:  RR 38, HR 95, BP 93/64 (MAP 74) Currently on dobutamine 4, levo at 16 UO 100cc Trop 94->1885->8000 BNP 1918 Cr 3.0 (baseline 2.15) EKG: sinus tachy, prior lateral infarct, poor R wave , 1st degree av block No chest pain ScVO2 is not real (87)  Cardiogenic shock- worsening hemodynamics Get ScVo2 again Trend lactate (not clearing due to congestive hepatopathy) Trop up due to MVD and demand ischemia from cardiogenic shock  Will increase dobutamine 50mg/kg/min Keep Nore epi to 16 or higher to maintain MAP Begin IV milrinone 0.1213m/kg/min Continue IV diuresis scvo2 is 33  Patient has advanced HF and is deteriorating on inotropes (SCAI stage D), intermacs 2 profile. We will get CATH team notified for support ([place]) early am Unclear if he will be a VAD candidate or not. Advanced HF team to see him in the morning  Addendum: I have paged Dr BeGwenlyn Foundnd texted Dr BeJeffie Pollock 4am, awaiting call back. Patient has good spirits but is very SOB, tachypneic, MAPs holding, cold legs, barely any urine output Meanwhile, will give him IV diuril '500mg'$  x1 lokelma given Cr 4.10 and K 6.3  RoRenae FickleMD Cardiology coverage  Addendum: spoke to Dr BeGwenlyn Foundho is aware of the patient and will talk to Dr BeHaroldine Lawsegarding escalating care vs other measures. Dr BeHaroldine Lawsas previously discussed with the team and he is unlikely to be a  Candidate for MCS as no further escalation (not a vad or transplant candidate due to comorbidities).   Advanced HF team will be in shortly and round on him. Critical care time spent >50 mins.  RoRenae FickleMD

## 2020-12-17 DEATH — deceased

## 2020-12-18 ENCOUNTER — Encounter (HOSPITAL_COMMUNITY): Payer: HMO

## 2020-12-20 ENCOUNTER — Other Ambulatory Visit: Payer: Self-pay | Admitting: Interventional Cardiology

## 2021-01-04 MED FILL — Medication: Qty: 1 | Status: AC

## 2021-03-02 ENCOUNTER — Other Ambulatory Visit: Payer: Self-pay | Admitting: Cardiology

## 2022-09-20 IMAGING — DX DG CHEST 1V PORT
1 series · 1 of 1 positions shown · non-contrast
Comparison: None.

CLINICAL DATA: Shortness of breath

EXAM:
PORTABLE CHEST 1 VIEW

[chest ap]
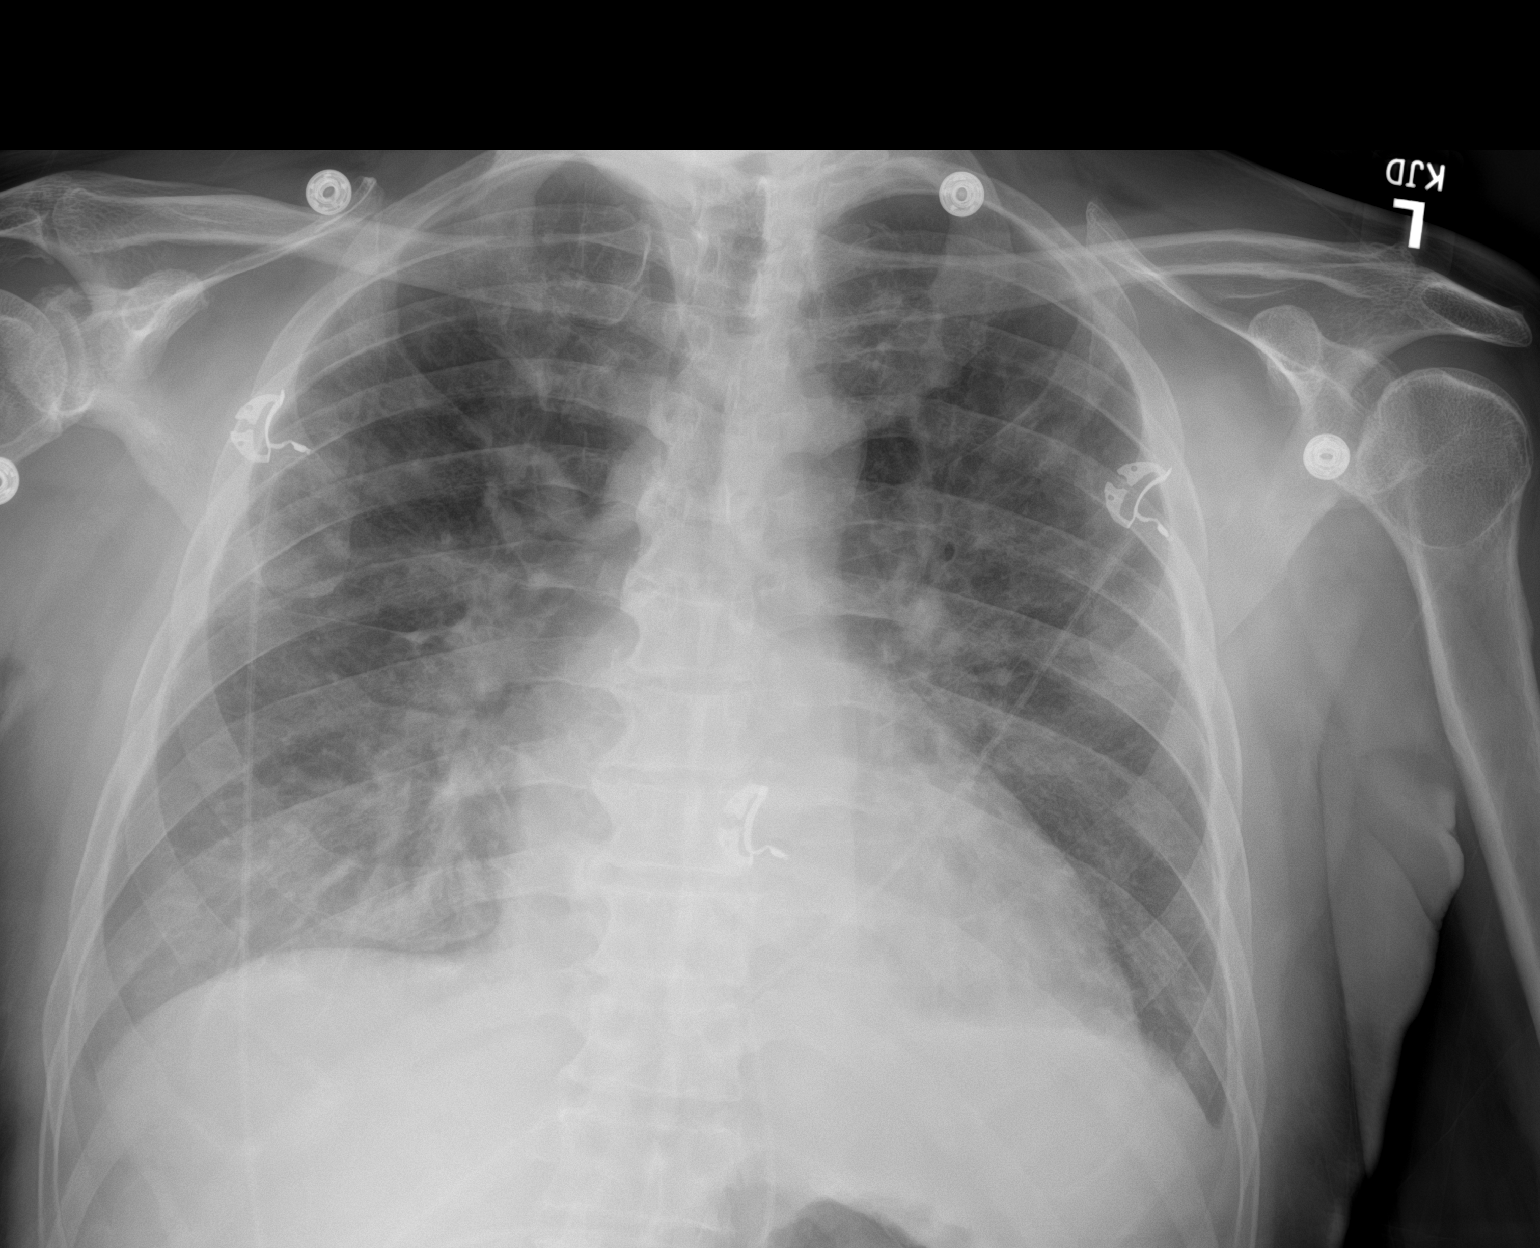

[1 of 1 positions shown; findings below may reference images not displayed]

FINDINGS: Cardiac shadow is mildly prominent but accentuated by the frontal
technique. Vascular congestion and patchy infiltrates are noted
throughout both lungs likely representing congestive failure. No
bony abnormality noted.
IMPRESSION: Changes most likely representing vascular congestion and pulmonary
edema.

## 2022-10-01 IMAGING — US US RENAL
1 series · 14 of 25 positions shown · non-contrast
Comparison: None.

CLINICAL DATA: Acute kidney injury

EXAM:
RENAL / URINARY TRACT ULTRASOUND COMPLETE

[Series 1: us renal · 14 of 38 slices shown]
[im 1/38]
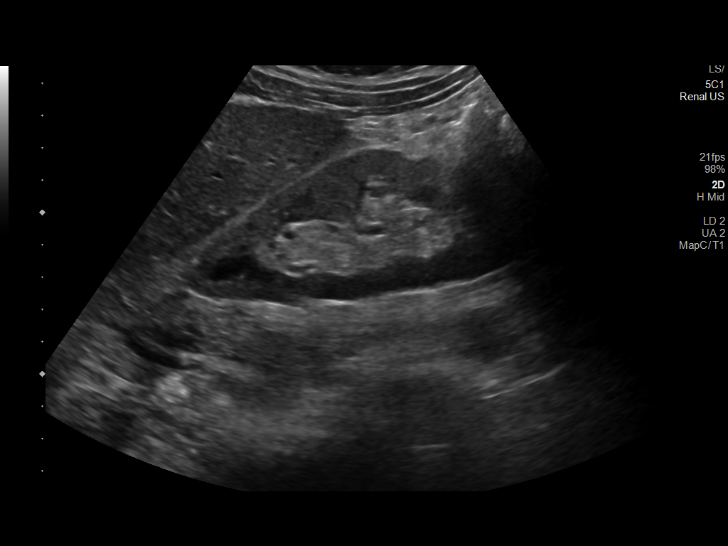
[im 4/38]
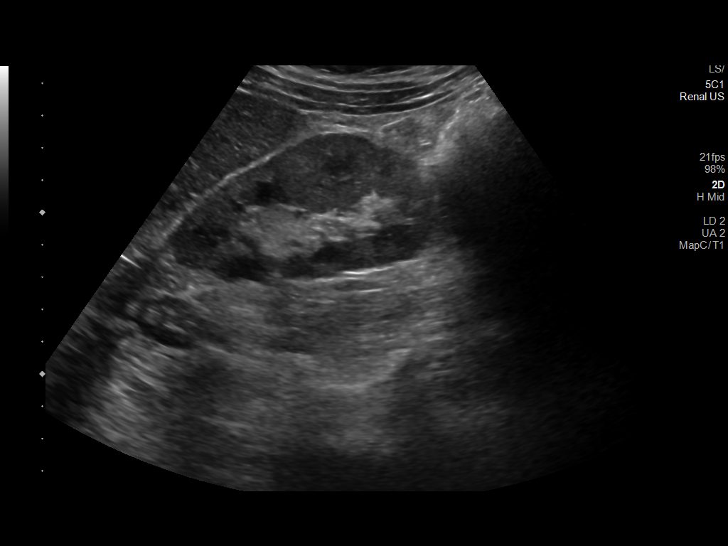
[im 7/38]
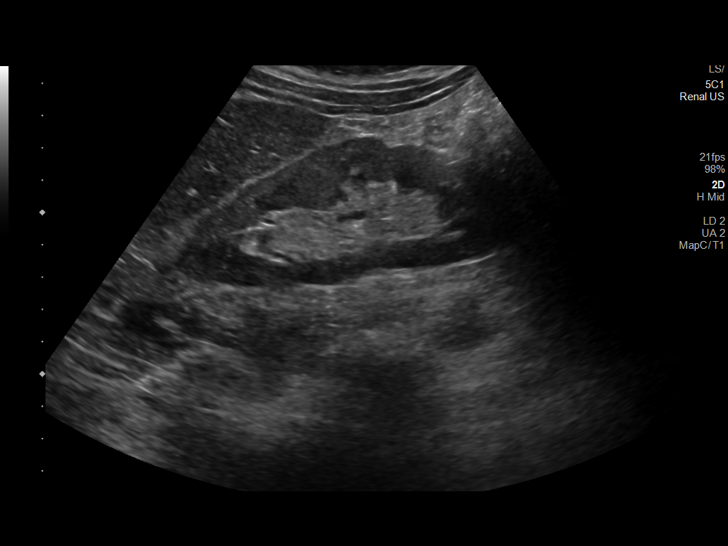
[im 10/38]
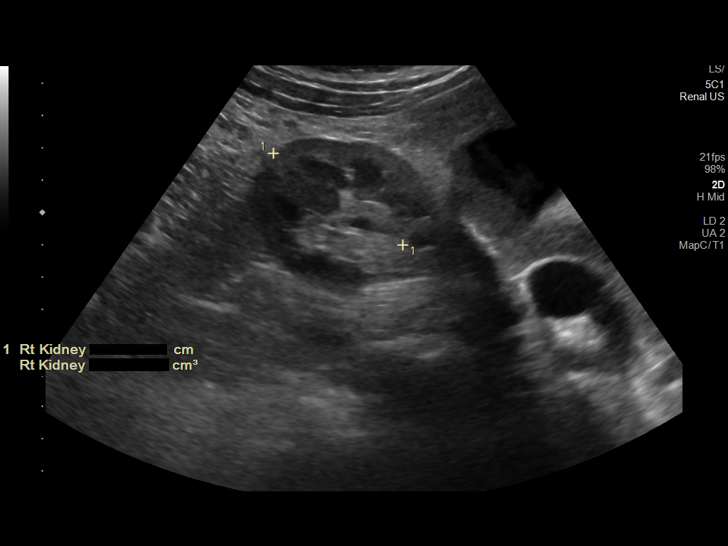
[im 13/38]
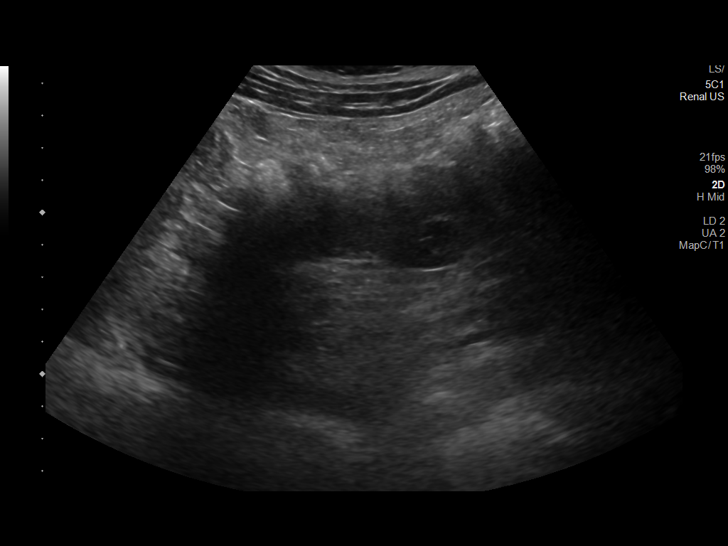
[im 14/38]
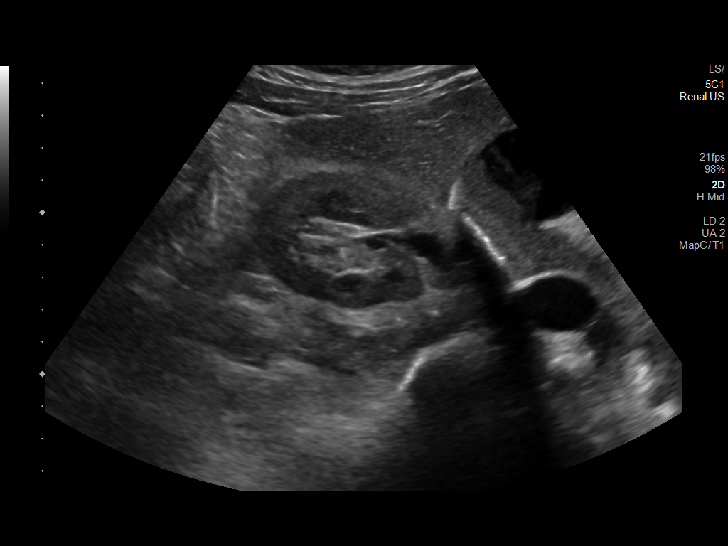
[im 17/38]
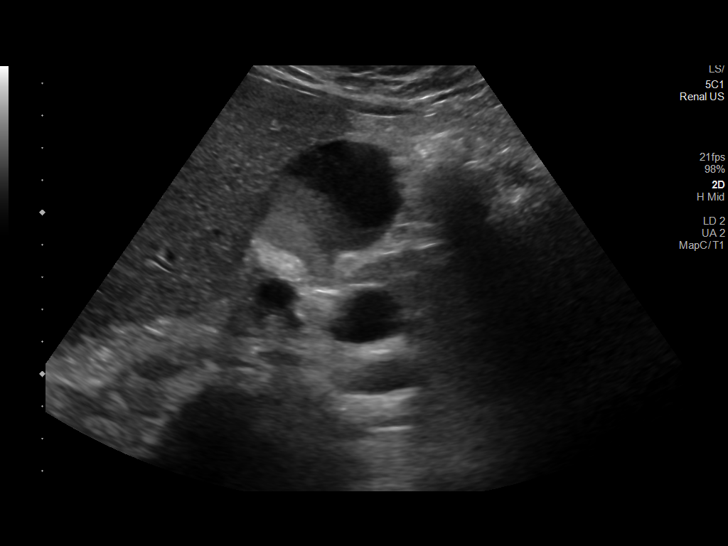
[im 21/38]
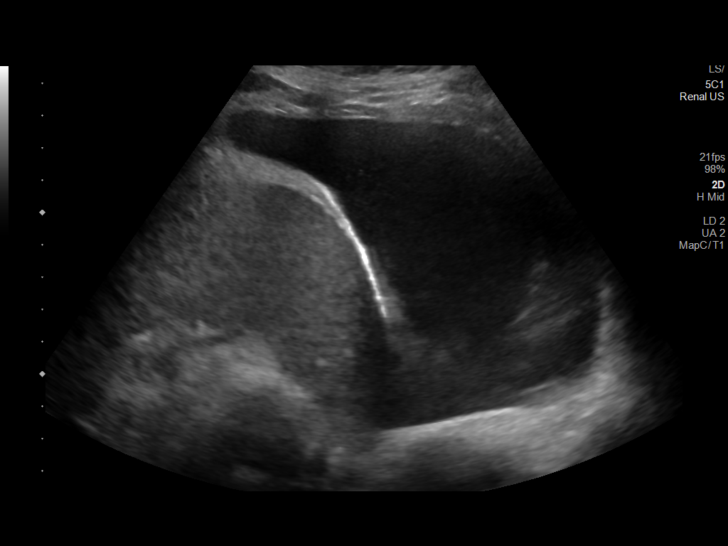
[im 24/38]
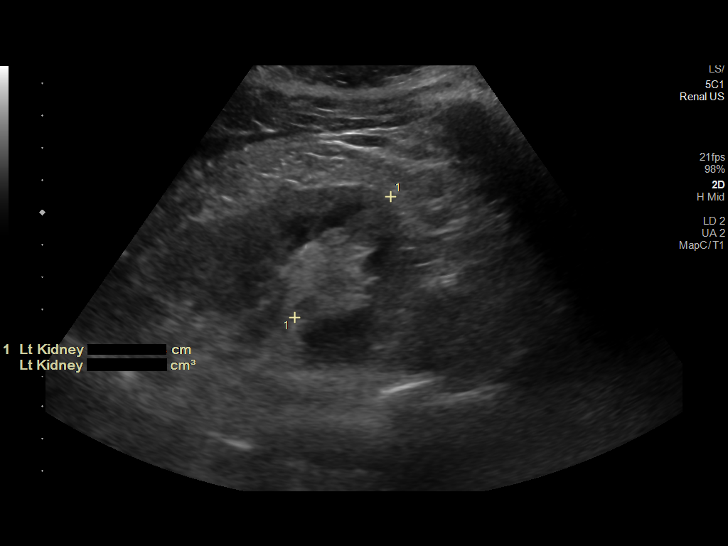
[im 25/38]
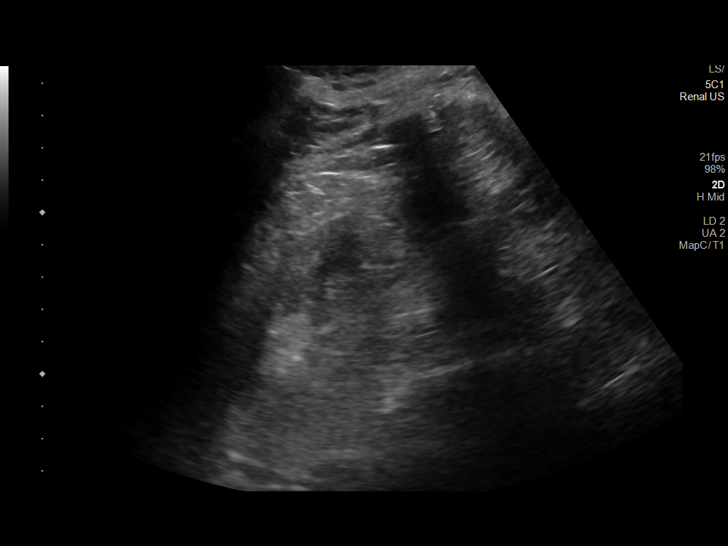
[im 28/38]
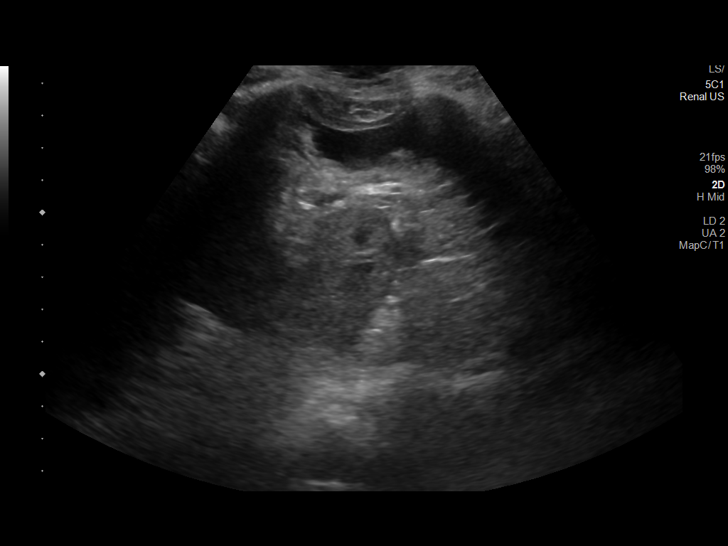
[im 31/38]
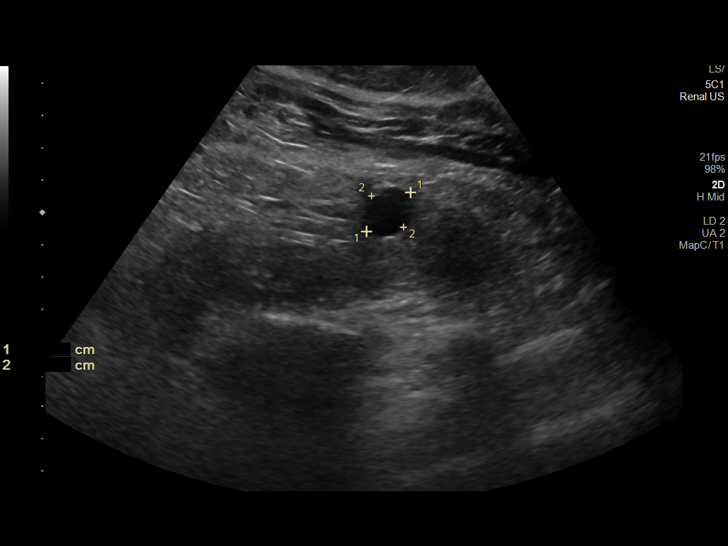
[im 34/38]
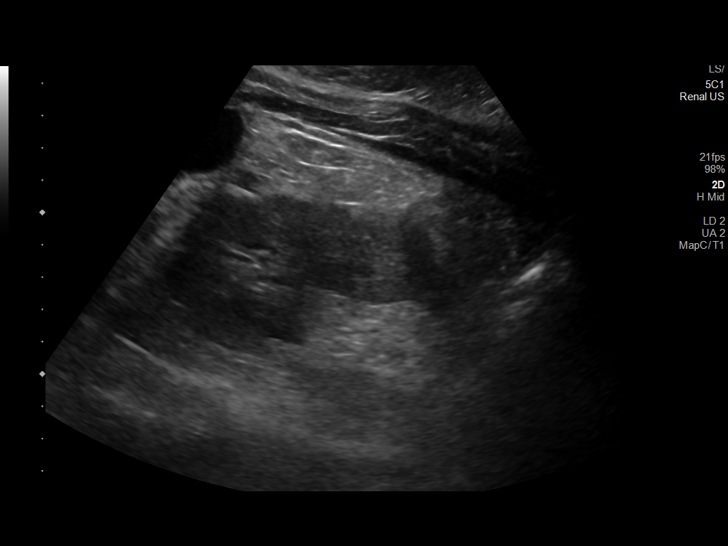
[im 38/38]
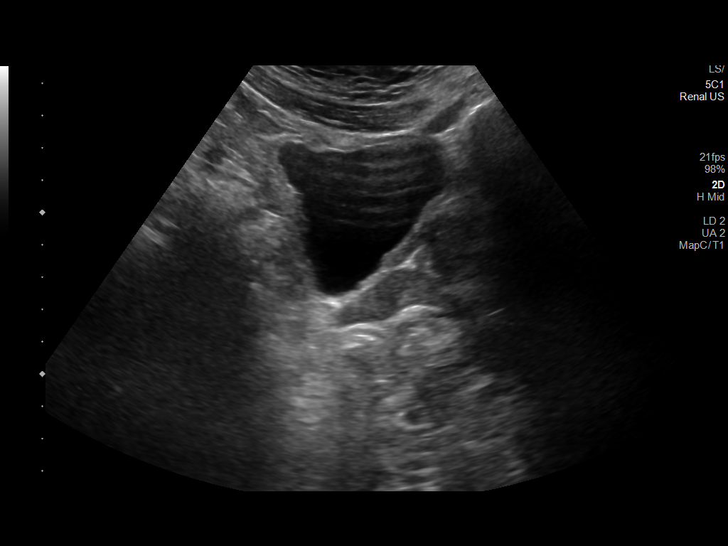

[14 of 25 positions shown; findings below may reference images not displayed]

FINDINGS: Right Kidney:

Renal measurements: 10.1 x 4.3 x 4.9 cm = volume: 111 mL.
Echogenicity within normal limits. No mass or hydronephrosis
visualized.

Left Kidney:

Renal measurements: 11.9 x 5.3 x 4.8 cm = volume: 158 mL.
Echogenicity within normal limits. Small simple appearing cyst in
the medial left kidney measuring 1.8 cm maximal diameter. No
follow-up is indicated. No solid mass or hydronephrosis visualized.

Bladder:

Appears normal for degree of bladder distention.

Other:

Incidental note of cholelithiasis with stones and sludge in the
gallbladder. No pericholecystic edema. Moderate left pleural
effusion.
IMPRESSION: 1. Small benign-appearing cyst in the left kidney. No follow-up
required. Kidneys and bladder are otherwise normal.
2. Incidental note of cholelithiasis and sludge in the gallbladder.
3. Moderate left pleural effusion.
